# Patient Record
Sex: Male | Born: 1950
Health system: Southern US, Community
[De-identification: ages and names within clinical notes are randomized; demographics above are authoritative.]

## PROBLEM LIST (undated history)

## (undated) DIAGNOSIS — H269 Unspecified cataract: Secondary | ICD-10-CM

## (undated) DIAGNOSIS — I10 Essential (primary) hypertension: Secondary | ICD-10-CM

## (undated) DIAGNOSIS — B019 Varicella without complication: Secondary | ICD-10-CM

## (undated) DIAGNOSIS — T7840XA Allergy, unspecified, initial encounter: Secondary | ICD-10-CM

## (undated) DIAGNOSIS — N390 Urinary tract infection, site not specified: Secondary | ICD-10-CM

## (undated) DIAGNOSIS — D649 Anemia, unspecified: Secondary | ICD-10-CM

## (undated) DIAGNOSIS — E538 Deficiency of other specified B group vitamins: Secondary | ICD-10-CM

## (undated) DIAGNOSIS — E119 Type 2 diabetes mellitus without complications: Secondary | ICD-10-CM

## (undated) DIAGNOSIS — A499 Bacterial infection, unspecified: Secondary | ICD-10-CM

## (undated) DIAGNOSIS — K219 Gastro-esophageal reflux disease without esophagitis: Secondary | ICD-10-CM

## (undated) DIAGNOSIS — E7800 Pure hypercholesterolemia, unspecified: Secondary | ICD-10-CM

## (undated) DIAGNOSIS — H409 Unspecified glaucoma: Secondary | ICD-10-CM

## (undated) DIAGNOSIS — I2510 Atherosclerotic heart disease of native coronary artery without angina pectoris: Secondary | ICD-10-CM

## (undated) HISTORY — DX: Atherosclerotic heart disease of native coronary artery without angina pectoris: I25.10

## (undated) HISTORY — DX: Essential (primary) hypertension: I10

## (undated) HISTORY — DX: Allergy, unspecified, initial encounter: T78.40XA

## (undated) HISTORY — DX: Deficiency of other specified B group vitamins: E53.8

## (undated) HISTORY — DX: Urinary tract infection, site not specified: A49.9

## (undated) HISTORY — PX: CARDIAC CATHETERIZATION: SHX172

## (undated) HISTORY — PX: CATARACT EXTRACTION, BILATERAL: SHX1313

## (undated) HISTORY — DX: Varicella without complication: B01.9

## (undated) HISTORY — DX: Urinary tract infection, site not specified: N39.0

## (undated) HISTORY — DX: Unspecified cataract: H26.9

## (undated) HISTORY — DX: Anemia, unspecified: D64.9

## (undated) HISTORY — DX: Unspecified glaucoma: H40.9

---

## 1991-01-16 DIAGNOSIS — A159 Respiratory tuberculosis unspecified: Secondary | ICD-10-CM

## 1991-01-16 HISTORY — DX: Respiratory tuberculosis unspecified: A15.9

## 2013-01-15 DIAGNOSIS — I219 Acute myocardial infarction, unspecified: Secondary | ICD-10-CM

## 2013-01-15 HISTORY — DX: Acute myocardial infarction, unspecified: I21.9

## 2013-08-20 ENCOUNTER — Telehealth: Payer: Self-pay | Admitting: Internal Medicine

## 2013-08-20 NOTE — Telephone Encounter (Signed)
Closed encounter °

## 2013-08-23 ENCOUNTER — Inpatient Hospital Stay (HOSPITAL_COMMUNITY)
Admission: EM | Admit: 2013-08-23 | Discharge: 2013-08-31 | DRG: 234 | Disposition: A | Discharge status: Home / Self Care | Payer: 59 | Attending: Cardiothoracic Surgery | Admitting: Cardiothoracic Surgery

## 2013-08-23 DIAGNOSIS — I2510 Atherosclerotic heart disease of native coronary artery without angina pectoris: Secondary | ICD-10-CM | POA: Diagnosis present

## 2013-08-23 DIAGNOSIS — K219 Gastro-esophageal reflux disease without esophagitis: Secondary | ICD-10-CM | POA: Diagnosis present

## 2013-08-23 DIAGNOSIS — Z951 Presence of aortocoronary bypass graft: Secondary | ICD-10-CM

## 2013-08-23 DIAGNOSIS — D62 Acute posthemorrhagic anemia: Secondary | ICD-10-CM | POA: Diagnosis not present

## 2013-08-23 DIAGNOSIS — Z8249 Family history of ischemic heart disease and other diseases of the circulatory system: Secondary | ICD-10-CM

## 2013-08-23 DIAGNOSIS — I214 Non-ST elevation (NSTEMI) myocardial infarction: Principal | ICD-10-CM | POA: Diagnosis present

## 2013-08-23 DIAGNOSIS — I10 Essential (primary) hypertension: Secondary | ICD-10-CM | POA: Diagnosis present

## 2013-08-23 DIAGNOSIS — R079 Chest pain, unspecified: Secondary | ICD-10-CM | POA: Diagnosis not present

## 2013-08-23 DIAGNOSIS — E118 Type 2 diabetes mellitus with unspecified complications: Secondary | ICD-10-CM

## 2013-08-23 DIAGNOSIS — E1122 Type 2 diabetes mellitus with diabetic chronic kidney disease: Secondary | ICD-10-CM | POA: Diagnosis present

## 2013-08-23 DIAGNOSIS — E119 Type 2 diabetes mellitus without complications: Secondary | ICD-10-CM | POA: Diagnosis present

## 2013-08-23 DIAGNOSIS — E7800 Pure hypercholesterolemia, unspecified: Secondary | ICD-10-CM | POA: Diagnosis present

## 2013-08-23 DIAGNOSIS — E785 Hyperlipidemia, unspecified: Secondary | ICD-10-CM | POA: Diagnosis present

## 2013-08-23 DIAGNOSIS — D696 Thrombocytopenia, unspecified: Secondary | ICD-10-CM | POA: Diagnosis not present

## 2013-08-23 DIAGNOSIS — Z79899 Other long term (current) drug therapy: Secondary | ICD-10-CM

## 2013-08-23 DIAGNOSIS — Z833 Family history of diabetes mellitus: Secondary | ICD-10-CM

## 2013-08-23 DIAGNOSIS — I2582 Chronic total occlusion of coronary artery: Secondary | ICD-10-CM | POA: Diagnosis present

## 2013-08-23 DIAGNOSIS — E1169 Type 2 diabetes mellitus with other specified complication: Secondary | ICD-10-CM | POA: Diagnosis present

## 2013-08-23 DIAGNOSIS — I252 Old myocardial infarction: Secondary | ICD-10-CM

## 2013-08-23 DIAGNOSIS — Z7982 Long term (current) use of aspirin: Secondary | ICD-10-CM

## 2013-08-23 HISTORY — DX: Type 2 diabetes mellitus without complications: E11.9

## 2013-08-23 HISTORY — DX: Gastro-esophageal reflux disease without esophagitis: K21.9

## 2013-08-23 HISTORY — DX: Pure hypercholesterolemia, unspecified: E78.00

## 2013-08-24 ENCOUNTER — Other Ambulatory Visit: Payer: Self-pay

## 2013-08-24 ENCOUNTER — Encounter (HOSPITAL_COMMUNITY)
Admission: EM | Disposition: A | Discharge status: Home / Self Care | Payer: Self-pay | Source: Home / Self Care | Attending: Cardiothoracic Surgery

## 2013-08-24 ENCOUNTER — Encounter (HOSPITAL_COMMUNITY): Payer: Self-pay | Admitting: Emergency Medicine

## 2013-08-24 ENCOUNTER — Emergency Department (HOSPITAL_COMMUNITY): Payer: 59

## 2013-08-24 DIAGNOSIS — I2582 Chronic total occlusion of coronary artery: Secondary | ICD-10-CM | POA: Diagnosis present

## 2013-08-24 DIAGNOSIS — I10 Essential (primary) hypertension: Secondary | ICD-10-CM

## 2013-08-24 DIAGNOSIS — Z833 Family history of diabetes mellitus: Secondary | ICD-10-CM | POA: Diagnosis not present

## 2013-08-24 DIAGNOSIS — D62 Acute posthemorrhagic anemia: Secondary | ICD-10-CM | POA: Diagnosis not present

## 2013-08-24 DIAGNOSIS — D696 Thrombocytopenia, unspecified: Secondary | ICD-10-CM | POA: Diagnosis not present

## 2013-08-24 DIAGNOSIS — E7800 Pure hypercholesterolemia, unspecified: Secondary | ICD-10-CM | POA: Diagnosis present

## 2013-08-24 DIAGNOSIS — E785 Hyperlipidemia, unspecified: Secondary | ICD-10-CM

## 2013-08-24 DIAGNOSIS — I252 Old myocardial infarction: Secondary | ICD-10-CM | POA: Diagnosis not present

## 2013-08-24 DIAGNOSIS — I214 Non-ST elevation (NSTEMI) myocardial infarction: Secondary | ICD-10-CM

## 2013-08-24 DIAGNOSIS — K219 Gastro-esophageal reflux disease without esophagitis: Secondary | ICD-10-CM | POA: Diagnosis present

## 2013-08-24 DIAGNOSIS — E119 Type 2 diabetes mellitus without complications: Secondary | ICD-10-CM

## 2013-08-24 DIAGNOSIS — R079 Chest pain, unspecified: Secondary | ICD-10-CM | POA: Diagnosis present

## 2013-08-24 DIAGNOSIS — Z79899 Other long term (current) drug therapy: Secondary | ICD-10-CM | POA: Diagnosis not present

## 2013-08-24 DIAGNOSIS — I2510 Atherosclerotic heart disease of native coronary artery without angina pectoris: Secondary | ICD-10-CM

## 2013-08-24 DIAGNOSIS — Z7982 Long term (current) use of aspirin: Secondary | ICD-10-CM | POA: Diagnosis not present

## 2013-08-24 DIAGNOSIS — Z8249 Family history of ischemic heart disease and other diseases of the circulatory system: Secondary | ICD-10-CM | POA: Diagnosis not present

## 2013-08-24 LAB — BASIC METABOLIC PANEL
ANION GAP: 14 (ref 5–15)
Anion gap: 13 (ref 5–15)
BUN: 17 mg/dL (ref 6–23)
BUN: 18 mg/dL (ref 6–23)
CALCIUM: 9.8 mg/dL (ref 8.4–10.5)
CHLORIDE: 101 meq/L (ref 96–112)
CHLORIDE: 105 meq/L (ref 96–112)
CO2: 25 mEq/L (ref 19–32)
CO2: 28 meq/L (ref 19–32)
CREATININE: 1.12 mg/dL (ref 0.50–1.35)
Calcium: 8.7 mg/dL (ref 8.4–10.5)
Creatinine, Ser: 1.28 mg/dL (ref 0.50–1.35)
GFR calc Af Amer: 68 mL/min — ABNORMAL LOW (ref >90)
GFR calc non Af Amer: 59 mL/min — ABNORMAL LOW (ref >90)
GFR calc non Af Amer: 69 mL/min — ABNORMAL LOW (ref >90)
GFR, EST AFRICAN AMERICAN: 80 mL/min — AB (ref >90)
GLUCOSE: 155 mg/dL — AB (ref 70–99)
Glucose, Bld: 145 mg/dL — ABNORMAL HIGH (ref 70–99)
Potassium: 4.4 mEq/L (ref 3.7–5.3)
Potassium: 4.9 mEq/L (ref 3.7–5.3)
SODIUM: 143 meq/L (ref 137–147)
Sodium: 143 mEq/L (ref 137–147)

## 2013-08-24 LAB — TROPONIN I
Troponin I: 1.56 ng/mL (ref <0.30)
Troponin I: 1.81 ng/mL (ref <0.30)

## 2013-08-24 LAB — CBC WITH DIFFERENTIAL/PLATELET
Basophils Absolute: 0 10*3/uL (ref 0.0–0.1)
Basophils Relative: 1 % (ref 0–1)
Eosinophils Absolute: 0.4 10*3/uL (ref 0.0–0.7)
Eosinophils Relative: 5 % (ref 0–5)
HCT: 47.7 % (ref 39.0–52.0)
Hemoglobin: 15.6 g/dL (ref 13.0–17.0)
LYMPHS PCT: 35 % (ref 12–46)
Lymphs Abs: 3 10*3/uL (ref 0.7–4.0)
MCH: 27.8 pg (ref 26.0–34.0)
MCHC: 32.7 g/dL (ref 30.0–36.0)
MCV: 84.9 fL (ref 78.0–100.0)
Monocytes Absolute: 0.7 10*3/uL (ref 0.1–1.0)
Monocytes Relative: 8 % (ref 3–12)
NEUTROS ABS: 4.6 10*3/uL (ref 1.7–7.7)
Neutrophils Relative %: 51 % (ref 43–77)
PLATELETS: 197 10*3/uL (ref 150–400)
RBC: 5.62 MIL/uL (ref 4.22–5.81)
RDW: 13.4 % (ref 11.5–15.5)
WBC: 8.7 10*3/uL (ref 4.0–10.5)

## 2013-08-24 LAB — GLUCOSE, CAPILLARY
Glucose-Capillary: 143 mg/dL — ABNORMAL HIGH (ref 70–99)
Glucose-Capillary: 102 mg/dL — ABNORMAL HIGH (ref 70–99)
Glucose-Capillary: 113 mg/dL — ABNORMAL HIGH (ref 70–99)
Glucose-Capillary: 203 mg/dL — ABNORMAL HIGH (ref 70–99)

## 2013-08-24 LAB — APTT: APTT: 25 s (ref 24–37)

## 2013-08-24 LAB — CK TOTAL AND CKMB (NOT AT ARMC)
CK, MB: 6.3 ng/mL (ref 0.3–4.0)
Relative Index: 3.1 — ABNORMAL HIGH (ref 0.0–2.5)
Total CK: 205 U/L (ref 7–232)

## 2013-08-24 LAB — CBC
HEMATOCRIT: 44.3 % (ref 39.0–52.0)
Hemoglobin: 14.5 g/dL (ref 13.0–17.0)
MCH: 28 pg (ref 26.0–34.0)
MCHC: 32.7 g/dL (ref 30.0–36.0)
MCV: 85.5 fL (ref 78.0–100.0)
PLATELETS: 170 10*3/uL (ref 150–400)
RBC: 5.18 MIL/uL (ref 4.22–5.81)
RDW: 13.5 % (ref 11.5–15.5)
WBC: 9 10*3/uL (ref 4.0–10.5)

## 2013-08-24 LAB — PROTIME-INR
INR: 1.01 (ref 0.00–1.49)
INR: 1.08 (ref 0.00–1.49)
Prothrombin Time: 13.3 seconds (ref 11.6–15.2)
Prothrombin Time: 14 seconds (ref 11.6–15.2)

## 2013-08-24 LAB — HEMOGLOBIN A1C
Hgb A1c MFr Bld: 7.4 % — ABNORMAL HIGH (ref <5.7)
Mean Plasma Glucose: 166 mg/dL — ABNORMAL HIGH (ref <117)

## 2013-08-24 LAB — I-STAT TROPONIN, ED: TROPONIN I, POC: 0.76 ng/mL — AB (ref 0.00–0.08)

## 2013-08-24 LAB — LIPID PANEL
CHOL/HDL RATIO: 5.1 ratio
Cholesterol: 208 mg/dL — ABNORMAL HIGH (ref 0–200)
HDL: 41 mg/dL (ref >39)
LDL CALC: 152 mg/dL — AB (ref 0–99)
Triglycerides: 73 mg/dL (ref <150)
VLDL: 15 mg/dL (ref 0–40)

## 2013-08-24 LAB — HEPARIN LEVEL (UNFRACTIONATED): Heparin Unfractionated: 0.4 IU/mL (ref 0.30–0.70)

## 2013-08-24 SURGERY — LEFT HEART CATHETERIZATION WITH CORONARY ANGIOGRAM
Anesthesia: LOCAL

## 2013-08-24 MED ORDER — SODIUM CHLORIDE 0.9 % IV SOLN
1.0000 mL/kg/h | INTRAVENOUS | Status: DC
  Administered 2013-08-24: 1 mL/kg/h via INTRAVENOUS

## 2013-08-24 MED ORDER — SODIUM CHLORIDE 0.9 % IV SOLN: 1.0000 mL/kg/h | INTRAVENOUS | Status: DC

## 2013-08-24 MED ORDER — ASPIRIN 81 MG PO CHEW
324.0000 mg | CHEWABLE_TABLET | Freq: Once | ORAL | Status: AC
  Administered 2013-08-24: 243 mg via ORAL
  Filled 2013-08-24: qty 4

## 2013-08-24 MED ORDER — ASPIRIN EC 81 MG PO TBEC
81.0000 mg | DELAYED_RELEASE_TABLET | Freq: Every day | ORAL | Status: DC
  Administered 2013-08-25: 81 mg via ORAL
  Filled 2013-08-24 (×2): qty 1

## 2013-08-24 MED ORDER — FENTANYL CITRATE 0.05 MG/ML IJ SOLN
INTRAMUSCULAR | Status: AC
  Filled 2013-08-24: qty 2

## 2013-08-24 MED ORDER — VERAPAMIL HCL 2.5 MG/ML IV SOLN
INTRAVENOUS | Status: AC
  Filled 2013-08-24: qty 2

## 2013-08-24 MED ORDER — HEPARIN SODIUM (PORCINE) 1000 UNIT/ML IJ SOLN
INTRAMUSCULAR | Status: AC
  Filled 2013-08-24: qty 1

## 2013-08-24 MED ORDER — LIDOCAINE HCL (PF) 1 % IJ SOLN
INTRAMUSCULAR | Status: AC
  Filled 2013-08-24: qty 30

## 2013-08-24 MED ORDER — METOPROLOL TARTRATE 25 MG PO TABS
25.0000 mg | ORAL_TABLET | Freq: Two times a day (BID) | ORAL | Status: DC
  Administered 2013-08-24 – 2013-08-25 (×4): 25 mg via ORAL
  Filled 2013-08-24 (×6): qty 1

## 2013-08-24 MED ORDER — SODIUM CHLORIDE 0.9 % IJ SOLN: 3.0000 mL | INTRAMUSCULAR | Status: DC | PRN

## 2013-08-24 MED ORDER — MIDAZOLAM HCL 2 MG/2ML IJ SOLN
INTRAMUSCULAR | Status: AC
  Filled 2013-08-24: qty 2

## 2013-08-24 MED ORDER — ASPIRIN 81 MG PO CHEW
81.0000 mg | CHEWABLE_TABLET | ORAL | Status: DC
  Filled 2013-08-24: qty 1

## 2013-08-24 MED ORDER — HEPARIN (PORCINE) IN NACL 100-0.45 UNIT/ML-% IJ SOLN
900.0000 [IU]/h | INTRAMUSCULAR | Status: DC
  Administered 2013-08-24: 4000 [IU]/h via INTRAVENOUS
  Filled 2013-08-24 (×3): qty 250

## 2013-08-24 MED ORDER — PANTOPRAZOLE SODIUM 40 MG PO TBEC
40.0000 mg | DELAYED_RELEASE_TABLET | Freq: Every day | ORAL | Status: DC
Start: 2013-08-24 — End: 2013-08-26
  Administered 2013-08-24 – 2013-08-25 (×2): 40 mg via ORAL
  Filled 2013-08-24 (×2): qty 1

## 2013-08-24 MED ORDER — NITROGLYCERIN 1 MG/10 ML FOR IR/CATH LAB
INTRA_ARTERIAL | Status: AC
  Filled 2013-08-24: qty 10

## 2013-08-24 MED ORDER — ONDANSETRON HCL 4 MG/2ML IJ SOLN: 4.0000 mg | Freq: Four times a day (QID) | INTRAMUSCULAR | Status: DC | PRN

## 2013-08-24 MED ORDER — PNEUMOCOCCAL VAC POLYVALENT 25 MCG/0.5ML IJ INJ
0.5000 mL | INJECTION | INTRAMUSCULAR | Status: AC
  Administered 2013-08-25: 0.5 mL via INTRAMUSCULAR
  Filled 2013-08-24: qty 0.5

## 2013-08-24 MED ORDER — HEPARIN (PORCINE) IN NACL 2-0.9 UNIT/ML-% IJ SOLN
INTRAMUSCULAR | Status: AC
  Filled 2013-08-24: qty 1500

## 2013-08-24 MED ORDER — SODIUM CHLORIDE 0.9 % IV SOLN
250.0000 mL | INTRAVENOUS | Status: DC | PRN
  Administered 2013-08-25: 250 mL via INTRAVENOUS
  Administered 2013-08-26: 14:00:00 via INTRAVENOUS

## 2013-08-24 MED ORDER — SODIUM CHLORIDE 0.9 % IJ SOLN
3.0000 mL | Freq: Two times a day (BID) | INTRAMUSCULAR | Status: DC
  Administered 2013-08-24 – 2013-08-25 (×3): 3 mL via INTRAVENOUS

## 2013-08-24 MED ORDER — ACETAMINOPHEN 325 MG PO TABS: 650.0000 mg | ORAL_TABLET | ORAL | Status: DC | PRN

## 2013-08-24 MED ORDER — HEPARIN (PORCINE) IN NACL 100-0.45 UNIT/ML-% IJ SOLN
1200.0000 [IU]/h | INTRAMUSCULAR | Status: DC
  Administered 2013-08-25: 900 [IU]/h via INTRAVENOUS
  Filled 2013-08-24 (×4): qty 250

## 2013-08-24 MED ORDER — SODIUM CHLORIDE 0.9 % IV SOLN
INTRAVENOUS | Status: AC
  Administered 2013-08-24: 18:00:00 via INTRAVENOUS

## 2013-08-24 MED ORDER — NITROGLYCERIN 0.4 MG SL SUBL: 0.4000 mg | SUBLINGUAL_TABLET | SUBLINGUAL | Status: DC | PRN

## 2013-08-24 MED ORDER — SODIUM CHLORIDE 0.9 % IJ SOLN: 3.0000 mL | Freq: Two times a day (BID) | INTRAMUSCULAR | Status: DC

## 2013-08-24 MED ORDER — SODIUM CHLORIDE 0.9 % IV SOLN: 250.0000 mL | INTRAVENOUS | Status: DC | PRN

## 2013-08-24 MED ORDER — ATORVASTATIN CALCIUM 40 MG PO TABS
40.0000 mg | ORAL_TABLET | Freq: Every day | ORAL | Status: DC
  Administered 2013-08-24 – 2013-08-30 (×6): 40 mg via ORAL
  Filled 2013-08-24 (×8): qty 1

## 2013-08-24 MED ORDER — INSULIN ASPART 100 UNIT/ML ~~LOC~~ SOLN
0.0000 [IU] | Freq: Three times a day (TID) | SUBCUTANEOUS | Status: DC
  Administered 2013-08-24 – 2013-08-25 (×2): 2 [IU] via SUBCUTANEOUS
  Administered 2013-08-25: 3 [IU] via SUBCUTANEOUS
  Administered 2013-08-26: 2 [IU] via SUBCUTANEOUS

## 2013-08-24 MED ORDER — SODIUM CHLORIDE 0.9 % IJ SOLN
3.0000 mL | INTRAMUSCULAR | Status: DC | PRN
Start: 2013-08-24 — End: 2013-08-24

## 2013-08-24 MED ORDER — HEPARIN BOLUS VIA INFUSION
4000.0000 [IU] | Freq: Once | INTRAVENOUS | Status: AC
  Administered 2013-08-24: 900 [IU] via INTRAVENOUS
  Filled 2013-08-24: qty 4000

## 2013-08-24 NOTE — ED Notes (Signed)
Pt just started on several new medication this week,  States it had been a while since he had been to dr.  He was started on simvastatin,  Metoprolol, metformin and omeprazole,  States no history that he was treated for prior to this past  week

## 2013-08-24 NOTE — Progress Notes (Signed)
Pt arrived via [facility]. Pt alert and oriented and denies CP or SOB at this time. Dr. Elias Else paged and made aware that patient has arrived on floor. Pt connected to monitor and is NSR-72. Will continue to assess.

## 2013-08-24 NOTE — Interval H&P Note (Signed)
History and Physical Interval Note:  08/24/2013 4:03 PM  Angel Benson  has presented today for cardiac cath with the diagnosis of chest pain/NSTEMI. The various methods of treatment have been discussed with the patient and family. After consideration of risks, benefits and other options for treatment, the patient has consented to  Procedure(s): LEFT HEART CATHETERIZATION WITH CORONARY ANGIOGRAM (N/A) as a surgical intervention .  The patient's history has been reviewed, patient examined, no change in status, stable for surgery.  I have reviewed the patient's chart and labs.  Questions were answered to the patient's satisfaction.    Cath Lab Visit (complete for each Cath Lab visit)  Clinical Evaluation Leading to the Procedure:   ACS: No.  Non-ACS:    Anginal Classification: CCS IV  Anti-ischemic medical therapy: Minimal Therapy (1 class of medications)  Non-Invasive Test Results: No non-invasive testing performed  Prior CABG: No previous CABG        Cristiana Yochim

## 2013-08-24 NOTE — Progress Notes (Signed)
[place] for IV Heparin Indication: chest pain/ACS  No Known Allergies  Patient Measurements: Height: 5\' 9"  (175.3 cm) Weight: 186 lb 3.2 oz (84.46 kg) IBW/kg (Calculated) : 70.7 Heparin Dosing Weight: 75 kg  Vital Signs: Temp: 97.9 F (36.6 C) (08/10 1310) Temp src: Oral (08/10 1310) BP: 130/68 mmHg (08/10 1310) Pulse Rate: 71 (08/10 1633)  Labs:  Recent Labs  08/24/13 0025 08/24/13 0141 08/24/13 0442 08/24/13 0827 08/24/13 1335  HGB 15.6  --  14.5  --   --   HCT 47.7  --  44.3  --   --   PLT 197  --  170  --   --   APTT  --  25  --   --   --   LABPROT  --  13.3 14.0  --   --   INR  --  1.01 1.08  --   --   HEPARINUNFRC  --   --   --  0.40  --   CREATININE 1.28  --  1.12  --   --   CKTOTAL  --   --   --   --  205  CKMB  --   --   --   --  6.3*  TROPONINI  --   --  1.56* 1.81*  --     Estimated Creatinine Clearance: 69.3 ml/min (by C-G formula based on Cr of 1.12).   Medical History: Past Medical History  Diagnosis Date  . Diabetes mellitus without complication   . GERD (gastroesophageal reflux disease)   . Hypercholesteremia     Medications:  Scheduled:  . [START ON 08/25/2013] aspirin  81 mg Oral Pre-Cath  . [START ON 08/25/2013] aspirin EC  81 mg Oral Daily  . atorvastatin  40 mg Oral q1800  . insulin aspart  0-15 Units Subcutaneous TID WC  . metoprolol tartrate  25 mg Oral BID  . pantoprazole  40 mg Oral Daily  . [START ON 08/25/2013] pneumococcal 23 valent vaccine  0.5 mL Intramuscular Tomorrow-1000  . sodium chloride  3 mL Intravenous Q12H  . sodium chloride  3 mL Intravenous Q12H  . sodium chloride  3 mL Intravenous Q12H   Infusions:  . [START ON 08/25/2013] sodium chloride 1 mL/kg/hr (08/24/13 0405)  . [START ON 08/25/2013] sodium chloride    . sodium chloride    . heparin Stopped (08/24/13 1519)    Assessment: 63 yo M admitted 08/23/2013 with chest pain.  Pharmacy consulted to dose heparin and then to  resume 8h post sheath out at 1645. Troponin=1.81  Heparin level is therapeutic.  Goal of Therapy:  Heparin level 0.3-0.7 units/ml Monitor platelets by anticoagulation protocol: Yes   Plan:   Resume heparin drip @ 900 units/hr at 0045  Daily CBC/HL   Thank you for allowing pharmacy to be a part of this patients care team.  Rowe Robert Pharm.D., BCPS, AQ-Cardiology Clinical Pharmacist 08/24/2013 5:32 PM Pager: [phone] Phone: [phone]

## 2013-08-24 NOTE — Progress Notes (Signed)
[place] for IV Heparin Indication: chest pain/ACS  No Known Allergies  Patient Measurements: Height: 5\' 9"  (175.3 cm) Weight: 186 lb 3.2 oz (84.46 kg) IBW/kg (Calculated) : 70.7 Heparin Dosing Weight: 75 kg  Vital Signs: Temp: 98.3 F (36.8 C) (08/10 0430) Temp src: Oral (08/10 0430) BP: 118/72 mmHg (08/10 1043) Pulse Rate: 77 (08/10 0430)  Labs:  Recent Labs  08/24/13 0025 08/24/13 0141 08/24/13 0442 08/24/13 0827  HGB 15.6  --  14.5  --   HCT 47.7  --  44.3  --   PLT 197  --  170  --   APTT  --  25  --   --   LABPROT  --  13.3 14.0  --   INR  --  1.01 1.08  --   HEPARINUNFRC  --   --   --  0.40  CREATININE 1.28  --  1.12  --   TROPONINI  --   --  1.56* 1.81*    Estimated Creatinine Clearance: 69.3 ml/min (by C-G formula based on Cr of 1.12).   Medical History: Past Medical History  Diagnosis Date  . Diabetes mellitus without complication   . GERD (gastroesophageal reflux disease)   . Hypercholesteremia     Medications:  Scheduled:  . [START ON 08/25/2013] aspirin EC  81 mg Oral Daily  . atorvastatin  40 mg Oral q1800  . insulin aspart  0-15 Units Subcutaneous TID WC  . metoprolol tartrate  25 mg Oral BID  . pantoprazole  40 mg Oral Daily  . [START ON 08/25/2013] pneumococcal 23 valent vaccine  0.5 mL Intramuscular Tomorrow-1000  . sodium chloride  3 mL Intravenous Q12H  . sodium chloride  3 mL Intravenous Q12H   Infusions:  . [START ON 08/25/2013] sodium chloride 1 mL/kg/hr (08/24/13 0405)  . heparin 4,000 Units/hr (08/24/13 8099)    Assessment: 63 yo c/o CP. Troponin=1.81  Heparin level is therapeutic. Goal of Therapy:  Heparin level 0.3-0.7 units/ml Monitor platelets by anticoagulation protocol: Yes   Plan:   Cont heparin drip @ 900 units/hr  Daily CBC/HL   f/u after cath  Tynasia Mccaul, [place] 08/24/2013,12:17 PM

## 2013-08-24 NOTE — Progress Notes (Addendum)
Patient ID: Angel Benson, male   DOB: 1950/09/11, 63 y.o.   MRN: [id number]    Patient: Angel Benson / Admit Date: 08/23/2013 / Date of Encounter: 08/24/2013, 8:58 AM   Subjective: Doing well. No further chest pain since admission. Troponin 1.56. EKG with TWI anteriorly and lateral depression. Possible cardiac cath today. Currently NPO.    Objective: Telemetry: NSR, HR 70-80.  Physical Exam: Blood pressure 125/79, pulse 77, temperature 98.3 F (36.8 C), temperature source Oral, resp. rate 18, height 5\' 9"  (1.753 m), weight 186 lb 3.2 oz (84.46 kg), SpO2 100.00%. General: Well developed, well nourished, in no acute distress.Pleasant.  Head: Normocephalic, atraumatic, sclera non-icteric, no xanthomas, nares are without discharge. Neck: Negative for carotid bruits. JVP not elevated. Lungs: Clear bilaterally to auscultation without wheezes, rales, or rhonchi. Breathing is unlabored. Heart: RRR S1 S2 without murmurs, rubs, or gallops.  Abdomen: Soft, non-tender, non-distended with normoactive bowel sounds. No rebound/guarding. Extremities: No clubbing or cyanosis. No edema. Distal pedal pulses are 2+ and equal bilaterally. Neuro: Alert and oriented X 3. Moves all extremities spontaneously. Psych:  Responds to questions appropriately with a normal affect.   Intake/Output Summary (Last 24 hours) at 08/24/13 0858 Last data filed at 08/24/13 0600  Gross per 24 hour  Intake 174.96 ml  Output      0 ml  Net 174.96 ml    Inpatient Medications:  . [START ON 08/25/2013] aspirin EC  81 mg Oral Daily  . atorvastatin  40 mg Oral q1800  . insulin aspart  0-15 Units Subcutaneous TID WC  . metoprolol tartrate  25 mg Oral BID  . pantoprazole  40 mg Oral Daily  . [START ON 08/25/2013] pneumococcal 23 valent vaccine  0.5 mL Intramuscular Tomorrow-1000  . sodium chloride  3 mL Intravenous Q12H  . sodium chloride  3 mL Intravenous Q12H   Infusions:  . [START ON 08/25/2013] sodium chloride 1 mL/kg/hr  (08/24/13 0405)  . heparin 4,000 Units/hr (08/24/13 0212)    Labs:  Recent Labs  08/24/13 0025 08/24/13 0442  NA 143 143  K 4.4 4.9  CL 101 105  CO2 28 25  GLUCOSE 145* 155*  BUN 18 17  CREATININE 1.28 1.12  CALCIUM 9.8 8.7   No results found for this basename: AST, ALT, ALKPHOS, BILITOT, PROT, ALBUMIN,  in the last 72 hours  Recent Labs  08/24/13 0025 08/24/13 0442  WBC 8.7 9.0  NEUTROABS 4.6  --   HGB 15.6 14.5  HCT 47.7 44.3  MCV 84.9 85.5  PLT 197 170    Recent Labs  08/24/13 0442  TROPONINI 1.56*   No components found with this basename: POCBNP,  No results found for this basename: HGBA1C,  in the last 72 hours   Radiology/Studies:  Dg Chest 2 View  08/24/2013   CLINICAL DATA:  Chest pain and shortness of breath.  EXAM: CHEST  2 VIEW  COMPARISON:  None.  FINDINGS: The lungs are well-aerated. Mild peribronchial thickening is noted. There is no evidence of focal opacification, pleural effusion or pneumothorax.  The heart is normal in size; the mediastinal contour is within normal limits. No acute osseous abnormalities are seen.  IMPRESSION: Mild peribronchial thickening noted; lungs remain grossly clear.   Electronically Signed   By: Garald Balding M.D.   On: 08/24/2013 01:00     Assessment and Plan  Problem List:  1. NSTEMI 2. HTN 3. Strong FH CAD 4. DM2 5. Hyperlipidemia  62 y/o male with PMHx  of HTN, HL, DM2 who [facility] with chest and was found to have EKG changes (TWI anteriorly and lateral depression) and elevated troponin (1.56) consistent with NSTEMI.    1) NSTEMI -His symptoms are quite convincing of possible ischemia as they have always been exertional in nature except for last night, which brought him into the hospital. Possible cardiac cath today for early intervention. Currently chest pain free. Will continue current meds. On heparin, aspirin, statin, bb. Discussed risks and benefits of cath with patient.   2)  HTN -Well controlled -Continue current medications  3) DM2 -Metformin has been held in preparation for possible cath, on SSI -HA1C still pending -This is a recent dx for him, he is unable to tell me what his prior A1C has been  4) Hyperlipidemia -On lipitor 40 mg  -He does not he a healthy diet    Signed, Christell Faith, PA-C 08/24/2013 9:02 AM  Attending Note:  The patient was seen and examined. Agree with assessment and plan as noted above. Changes made to the above note as needed.  1. CAD:  Pt presents with CP and + Troponin levels. Better on IV heparin.  Will proceed with cath today.  I have discussed risks, benefits,options. He understands and agrees to proceed.  2. DM:  metformin is currently on hold.  3. Hyperlipidemia:  On atorvastatin 40 a day.    Thayer Headings, Brooke Bonito., MD, [facility]  08/24/2013, 11:15 AM  [address] [place]  Pager [phone]

## 2013-08-24 NOTE — H&P (View-Only) (Signed)
Patient ID: Angel Benson, male   DOB: [birth year], 63 y.o.   MRN: [id number]    Patient: Angel Benson / Admit Date: 08/23/2013 / Date of Encounter: 08/24/2013, 8:58 AM   Subjective: Doing well. No further chest pain since admission. Troponin 1.56. EKG with TWI anteriorly and lateral depression. Possible cardiac cath today. Currently NPO.    Objective: Telemetry: NSR, HR 70-80.  Physical Exam: Blood pressure 125/79, pulse 77, temperature 98.3 F (36.8 C), temperature source Oral, resp. rate 18, height 5\' 9"  (1.753 m), weight 186 lb 3.2 oz (84.46 kg), SpO2 100.00%. General: Well developed, well nourished, in no acute distress.Pleasant.  Head: Normocephalic, atraumatic, sclera non-icteric, no xanthomas, nares are without discharge. Neck: Negative for carotid bruits. JVP not elevated. Lungs: Clear bilaterally to auscultation without wheezes, rales, or rhonchi. Breathing is unlabored. Heart: RRR S1 S2 without murmurs, rubs, or gallops.  Abdomen: Soft, non-tender, non-distended with normoactive bowel sounds. No rebound/guarding. Extremities: No clubbing or cyanosis. No edema. Distal pedal pulses are 2+ and equal bilaterally. Neuro: Alert and oriented X 3. Moves all extremities spontaneously. Psych:  Responds to questions appropriately with a normal affect.   Intake/Output Summary (Last 24 hours) at 08/24/13 0858 Last data filed at 08/24/13 0600  Gross per 24 hour  Intake 174.96 ml  Output      0 ml  Net 174.96 ml    Inpatient Medications:  . [START ON 08/25/2013] aspirin EC  81 mg Oral Daily  . atorvastatin  40 mg Oral q1800  . insulin aspart  0-15 Units Subcutaneous TID WC  . metoprolol tartrate  25 mg Oral BID  . pantoprazole  40 mg Oral Daily  . [START ON 08/25/2013] pneumococcal 23 valent vaccine  0.5 mL Intramuscular Tomorrow-1000  . sodium chloride  3 mL Intravenous Q12H  . sodium chloride  3 mL Intravenous Q12H   Infusions:  . [START ON 08/25/2013] sodium chloride 1 mL/kg/hr  (08/24/13 0405)  . heparin 4,000 Units/hr (08/24/13 0212)    Labs:  Recent Labs  08/24/13 0025 08/24/13 0442  NA 143 143  K 4.4 4.9  CL 101 105  CO2 28 25  GLUCOSE 145* 155*  BUN 18 17  CREATININE 1.28 1.12  CALCIUM 9.8 8.7   No results found for this basename: AST, ALT, ALKPHOS, BILITOT, PROT, ALBUMIN,  in the last 72 hours  Recent Labs  08/24/13 0025 08/24/13 0442  WBC 8.7 9.0  NEUTROABS 4.6  --   HGB 15.6 14.5  HCT 47.7 44.3  MCV 84.9 85.5  PLT 197 170    Recent Labs  08/24/13 0442  TROPONINI 1.56*   No components found with this basename: POCBNP,  No results found for this basename: HGBA1C,  in the last 72 hours   Radiology/Studies:  Dg Chest 2 View  08/24/2013   CLINICAL DATA:  Chest pain and shortness of breath.  EXAM: CHEST  2 VIEW  COMPARISON:  None.  FINDINGS: The lungs are well-aerated. Mild peribronchial thickening is noted. There is no evidence of focal opacification, pleural effusion or pneumothorax.  The heart is normal in size; the mediastinal contour is within normal limits. No acute osseous abnormalities are seen.  IMPRESSION: Mild peribronchial thickening noted; lungs remain grossly clear.   Electronically Signed   By: Garald Balding M.D.   On: 08/24/2013 01:00     Assessment and Plan  Problem List:  1. NSTEMI 2. HTN 3. Strong FH CAD 4. DM2 5. Hyperlipidemia  63 y/o male with PMHx  of HTN, HL, DM2 who presented to [facility] on 08/24/2013 with chest and was found to have EKG changes (TWI anteriorly and lateral depression) and elevated troponin (1.56) consistent with NSTEMI.    1) NSTEMI -His symptoms are quite convincing of possible ischemia as they have always been exertional in nature except for last night, which brought him into the hospital. Possible cardiac cath today for early intervention. Currently chest pain free. Will continue current meds. On heparin, aspirin, statin, bb. Discussed risks and benefits of cath with patient.   2)  HTN -Well controlled -Continue current medications  3) DM2 -Metformin has been held in preparation for possible cath, on SSI -HA1C still pending -This is a recent dx for him, he is unable to tell me what his prior A1C has been  4) Hyperlipidemia -On lipitor 40 mg  -He does not he a healthy diet    Signed, Christell Faith, PA-C 08/24/2013 9:02 AM  Attending Note:  The patient was seen and examined. Agree with assessment and plan as noted above. Changes made to the above note as needed.  1. CAD:  Pt presents with CP and + Troponin levels. Better on IV heparin.  Will proceed with cath today.  I have discussed risks, benefits,options. He understands and agrees to proceed.  2. DM:  metformin is currently on hold.  3. Hyperlipidemia:  On atorvastatin 40 a day.    Thayer Headings, Brooke Bonito., MD, [facility]  08/24/2013, 11:15 AM  [address], [place]  Pager [phone]

## 2013-08-24 NOTE — Progress Notes (Signed)
Pt. Arrived back to unit from cath procedure with right TR band placed.  Site assessed, level 0, no bleeding.  Initially 10 cc of air in TR armband.  TR band deflated following protocol.  Frequent vitals set up.  No bleeding observed during deflation. Vital signs remained stable.  No complaints from pt.  TR band removed at 1820.  Will continue to monitor closely.  Kizzie Ide, RN

## 2013-08-24 NOTE — CV Procedure (Signed)
Cardiac Catheterization Operative Report  Angel Benson [id number] 8/10/20154:44 PM No PCP Per Patient  Procedure Performed:  1. Left Heart Catheterization 2. Selective Coronary Angiography 3. Left ventricular angiogram  Operator: Lauree Chandler, MD  Arterial access site:  Right radial artery.   Indication: 63 yo male with history of DM, HTN, HLD admitted with NSTEMI.                                     Procedure Details: The risks, benefits, complications, treatment options, and expected outcomes were discussed with the patient. The patient and/or family concurred with the proposed plan, giving informed consent. The patient was brought to the cath lab after IV hydration was begun and oral premedication was given. The patient was further sedated with Versed and Fentanyl. The right wrist was assessed with a reverse Allens test which was positive. The right wrist was prepped and draped in a sterile fashion. 1% lidocaine was used for local anesthesia. Using the modified Seldinger access technique, a 5 French sheath was placed in the right radial artery. 3 mg Verapamil was given through the sheath. 4300 units IV heparin was given. A JR 4 catheter was used to engage the RCA. Selective angiography was performed. A XB LAD 3.5 guiding catheter was used to engage the left main. Selective angiography was performed. A pigtail catheter was used to perform a left ventricular angiogram. The sheath was removed from the right radial artery and a Terumo hemostasis band was applied at the arteriotomy site on the right wrist.    There were no immediate complications. The patient was taken to the recovery area in stable condition.   Hemodynamic Findings: Central aortic pressure: 121/70 Left ventricular pressure: 117/2/4  Angiographic Findings:  Left main: Long vessel with diffuse 40-50% narrowing. There is dampening of the pressure tracing with catheter engagement.   Left Anterior  Descending Artery: Large caliber vessel that courses to the apex. The proximal vessel has a long segment with 99% sub-total occlusion. The mid vessel has diffuse plaque disease. The distal vessel is occluded and fills from right to left collaterals. There are two small to moderate caliber diagonal branches. These branches appear to have diffuse moderate stenosis.    Circumflex Artery: Large caliber vessel with three moderate to large caliber obtuse marginal branches. The proximal Circumflex has diffuse 30% stenosis. The mid vessel has diffuse 70% stenosis. The distal vessel has 99% stenosis. The first OM branch is a moderate to large caliber vessel with proximal 99% stenosis. The second OM branch is a moderate to large caliber vessel with 60% stenosis. The third OM branch is a moderate caliber vessel with proximal 99% stenosis. This vessel takes off after the severe mid AV groove vessel stenosis.   Right Coronary Artery: Moderate caliber co-dominant vessel with diffuse 90% stenosis throughout the entire mid vessel followed by 100% distal occlusion. The posterolateral branch and the PDA fill by right to right collaterals.   Left Ventricular Angiogram: LVEF=45-50% with inferior wall hypokinesis.   Impression: 1. Severe triple vessel CAD with total occlusion of the distal RCA, severe proximal LAD stenosis involving a long segment, occluded distal LAD, high grade disease in all three obtuse marginal branches and the mid Circumflex.   2. NSTEMI 3. Mild segmental LV systolic dysfunction  Recommendations: He has severe multi-vessel CAD, presenting as NSTEMI. Will consult CT surgery for CABG. (I have left  a message on the voice mail in the CT surgery office tonight). Will need to f/u in the am to make sure this is communicated. Will restart heparin IV 8 hours post sheath pull.        Complications:  None. The patient tolerated the procedure well.

## 2013-08-24 NOTE — Progress Notes (Signed)
Patient ID: Angel Benson, male   DOB: 11-19-1950, 63 y.o.   MRN: [id number]      Angel Benson       ,Angel Benson Date of Birth: [birth year]  Referring: Dr Debara Pickett Primary Care: No PCP Per Patient  Chief Complaint:    Chief Complaint  Patient presents with  . Chest Pain  . Shortness of Breath    HPI: 63 y.o. male  who presented to [place] and transferred to  [facility] on 08/24/2013 with complaints of chest pain. Recently seen by walk in clinic who started him on several new medications including a PPI. Over the past 2 months has had intermittent chest discomfort. Substernal, burning in nature, sometimes associated with food and sometimes assoc with exertion though symptoms are not consistent.  With associated symptoms of SOB, no  Radiation or  diaphoresis. He has strong family history of DM, brother with insulin pump and another brother s/p cab 4 years ago at young age.   Based upon symptoms was referred to cardiology, Patient has cardiology appointment for September 21 with Dr. Debara Pickett . On the evening of presentation, he had worse episode than previous in that occurred at rest and sought emergent care at [facility] long. Lasted for approx 45 minutes and resolved while at the ER.  In ER, was chest pain free. Given aspirin and started on heparin gtt after +troponin. Currently comfortable.  EKG revealed NSR with TWI anteriorly and lateral depressions. CXR was without acute cardiopulmonary abnormalities. Labs are significant for elevated troponin.      Current Activity/ Functional Status: Patient is independent with mobility/ambulation, transfers, ADL's, IADL's.   Zubrod Score: At the time of surgery this patient's most appropriate activity status/level should be described as: [x]     0    Normal activity, no symptoms []     1    Restricted in physical strenuous activity but  ambulatory, able to do out light work []     2    Ambulatory and capable of self care, unable to do work activities, up and about                 more than 50%  Of the time                            []     3    Only limited self care, in bed greater than 50% of waking hours []     4    Completely disabled, no self care, confined to bed or chair []     5    Moribund  Past Medical History  Diagnosis Date  . Diabetes mellitus without complication- new dx patient unaware    . GERD (gastroesophageal reflux disease)   . Hypercholesteremia     History reviewed. No pertinent past surgical history.  History  Smoking status  . Never Smoker   Smokeless tobacco  . Not on file    History  Alcohol Use No   Works [employer]ing - work involves lifting   No Known Allergies  Current Facility-Administered Medications  Medication Dose Route Frequency Provider Last Rate Last Dose  . 0.9 %  sodium chloride infusion  250 mL Intravenous PRN Cletus Gash, MD      .  0.9 %  sodium chloride infusion   Intravenous Continuous Burnell Blanks, MD 75 mL/hr at 08/24/13 1731    . acetaminophen (TYLENOL) tablet 650 mg  650 mg Oral Q4H PRN Cletus Gash, MD      . Derrill Memo ON 08/25/2013] aspirin EC tablet 81 mg  81 mg Oral Daily Cletus Gash, MD      . atorvastatin (LIPITOR) tablet 40 mg  40 mg Oral q1800 Cletus Gash, MD      . Derrill Memo ON 08/25/2013] heparin ADULT infusion 100 units/mL (25000 units/250 mL)  900 Units/hr Intravenous Continuous Jolaine Artist, MD      . insulin aspart (novoLOG) injection 0-15 Units  0-15 Units Subcutaneous TID WC Cletus Gash, MD   2 Units at 08/24/13 [phone]  . metoprolol tartrate (LOPRESSOR) tablet 25 mg  25 mg Oral BID Cletus Gash, MD   25 mg at 08/24/13 1043  . nitroGLYCERIN (NITROSTAT) SL tablet 0.4 mg  0.4 mg Sublingual Q5 Min x 3 PRN Cletus Gash, MD      . ondansetron [facility]) injection 4 mg  4 mg Intravenous Q6H PRN Cletus Gash,  MD      . pantoprazole (PROTONIX) EC tablet 40 mg  40 mg Oral Daily Cletus Gash, MD   40 mg at 08/24/13 1043  . [START ON 08/25/2013] pneumococcal 23 valent vaccine (PNU-IMMUNE) injection 0.5 mL  0.5 mL Intramuscular Tomorrow-1000 Shaune Pascal Bensimhon, MD      . sodium chloride 0.9 % injection 3 mL  3 mL Intravenous Q12H Cletus Gash, MD   3 mL at 08/24/13 0345  . sodium chloride 0.9 % injection 3 mL  3 mL Intravenous PRN Cletus Gash, MD        Prescriptions prior to admission  Medication Sig Dispense Refill  . metFORMIN (GLUCOPHAGE) 500 MG tablet Take 500 mg by mouth 2 (two) times daily with a meal.      . metoprolol succinate (TOPROL-XL) 50 MG 24 hr tablet Take 50 mg by mouth daily. Take with or immediately following a meal.      . omeprazole (PRILOSEC) 20 MG capsule Take 20 mg by mouth daily.      . simvastatin (ZOCOR) 20 MG tablet Take 20 mg by mouth daily.        Family History: Both mother anfd father with DM, brother had cabg by me 4 years ago at [facility], another brother  hx of dm x 30 yesr uses insulin pump   Review of Systems:     Cardiac Review of Systems: Y or N  Chest Pain [  y  ]  Resting SOB [ y  ] Exertional SOB  [  y]  Orthopnea [  n]   Pedal Edema [ n  ]    Palpitations [ n ] Syncope  [n ]   Presyncope [  n ]  General Review of Systems: [Y] = yes [  ]=no Constitional: recent weight change [n  ]; anorexia [  ]; fatigue [ y ]; nausea [n  ]; night sweats [ n ]; fever [  ]; or chills [n  ]                                                               Dental: poor dentition[  ];  Last Dentist visit:   Eye : blurred vision [  ]; diplopia [   ]; vision changes [  ];  Amaurosis fugax[  ]; Resp: cough [  ];  wheezing[  ];  hemoptysis[n  ]; shortness of breath[ y ]; paroxysmal nocturnal dyspnea[n  ]; dyspnea on exertion[  ]; or orthopnea[n  ];  GI:  gallstones[ n ], vomiting[n ];  dysphagia[  ]; melena[  ];  hematochezia [  ]; heartburn[  ];   Hx of  Colonoscopy[n  ]; GU:  kidney stones [  ]; hematuria[  ];   dysuria [  ];  nocturia[  ];  history of     obstruction [  ]; urinary frequency [n  ]             Skin: rash, swelling[  ];, hair loss[  ];  peripheral edema[  ];  or itching[  ]; Musculosketetal: myalgias[  ];  joint swelling[  ];  joint erythema[  ];  joint pain[  ];  back pain[  ];  Heme/Lymph: bruising[  ];  bleeding[  ];  anemia[  ];  Neuro: TIA[ n ];  headaches[  ];  stroke[ n];  vertigo[  ];  seizures[  ];   paresthesias[  ];  difficulty walking[  ];  Psych:depression[  ]; anxiety[  ];  Endocrine: diabetes[  New dx];  thyroid dysfunction[  ];  Immunizations: Flu [ ? ]; Pneumococcal[ ? ];  Other:  Physical Exam: BP 125/70  Pulse 74  Temp(Src) 97.9 F (36.6 C) (Oral)  Resp 18  Ht 5\' 9"  (1.753 m)  Wt 186 lb 3.2 oz (84.46 kg)  BMI 27.48 kg/m2  SpO2 100%  General appearance: alert, cooperative, appears stated age and no distress Neurologic: intact Heart: regular rate and rhythm, S1, S2 normal, no murmur, click, rub or gallop Lungs: clear to auscultation bilaterally Abdomen: soft, non-tender; bowel sounds normal; no masses,  no organomegaly Extremities: extremities normal, atraumatic, no cyanosis or edema and Homans sign is negative, no sign of DVT Wound: rt radial compression in place  no carotid bruits, 2 + dp and PT pulses bilaterial   Diagnostic Studies & Laboratory data:     Recent Radiology Findings:   Dg Chest 2 View  08/24/2013   CLINICAL DATA:  Chest pain and shortness of breath.  EXAM: CHEST  2 VIEW  COMPARISON:  None.  FINDINGS: The lungs are well-aerated. Mild peribronchial thickening is noted. There is no evidence of focal opacification, pleural effusion or pneumothorax.  The heart is normal in size; the mediastinal contour is within normal limits. No acute osseous abnormalities are seen.  IMPRESSION: Mild peribronchial thickening noted; lungs remain grossly clear.   Electronically Signed   By: Garald Balding M.D.   On:  08/24/2013 01:00      Recent Lab Findings: Lab Results  Component Value Date   WBC 9.0 08/24/2013   HGB 14.5 08/24/2013   HCT 44.3 08/24/2013   PLT 170 08/24/2013   GLUCOSE 155* 08/24/2013   CHOL 208* 08/24/2013   TRIG 73 08/24/2013   HDL 41 08/24/2013   LDLCALC 152* 08/24/2013   NA 143 08/24/2013   K 4.9 08/24/2013   CL 105 08/24/2013   CREATININE 1.12 08/24/2013   BUN 17 08/24/2013   CO2 25 08/24/2013   INR 1.08 08/24/2013   HGBA1C 7.4* 08/24/2013   Lab Results  Component Value Date   CKTOTAL 205 08/24/2013   CKMB 6.3* 08/24/2013  TROPONINI 1.81* 08/24/2013   CATH: Hemodynamic Findings:  Central aortic pressure: 121/70  Left ventricular pressure: 117/2/4  Angiographic Findings:  Left main: Long vessel with diffuse 40-50% narrowing. There is dampening of the pressure tracing with catheter engagement.  Left Anterior Descending Artery: Large caliber vessel that courses to the apex. The proximal vessel has a long segment with 99% sub-total occlusion. The mid vessel has diffuse plaque disease. The distal vessel is occluded and fills from right to left collaterals. There are two small to moderate caliber diagonal branches. These branches appear to have diffuse moderate stenosis.  Circumflex Artery: Large caliber vessel with three moderate to large caliber obtuse marginal branches. The proximal Circumflex has diffuse 30% stenosis. The mid vessel has diffuse 70% stenosis. The distal vessel has 99% stenosis. The first OM branch is a moderate to large caliber vessel with proximal 99% stenosis. The second OM branch is a moderate to large caliber vessel with 60% stenosis. The third OM branch is a moderate caliber vessel with proximal 99% stenosis. This vessel takes off after the severe mid AV groove vessel stenosis.  Right Coronary Artery: Moderate caliber co-dominant vessel with diffuse 90% stenosis throughout the entire mid vessel followed by 100% distal occlusion. The posterolateral branch and the PDA  fill by right to right collaterals.  Left Ventricular Angiogram: LVEF=45-50% with inferior wall hypokinesis.  Impression:  1. Severe triple vessel CAD with total occlusion of the distal RCA, severe proximal LAD stenosis involving a long segment, occluded distal LAD, high grade disease in all three obtuse marginal branches and the mid Circumflex.  2. NSTEMI  3. Mild segmental LV systolic dysfunction  Recommendations: He has severe multi-vessel CAD, presenting as NSTEMI. Will consult CT surgery for CABG. (I have left a message on the voice mail in the CT surgery office tonight). Will need to f/u in the am to make sure this is communicated. Will restart heparin IV 8 hours post sheath pull.  Complications: None. The patient tolerated the procedure well.      Assessment / Plan:   Three vessel cad with non  stemi  New dx of DM hypertension  I have seen patient and reviewed films with him. Agree with CABG as best treatment option.  Plan cabg Wednesday am The goals risks and alternatives of the planned surgical procedure CABG have been discussed with the patient in detail. The risks of the procedure including death, infection, stroke, myocardial infarction, bleeding, blood transfusion have all been discussed specifically.  I have quoted Teresa Pelton a 3 % of perioperative mortality and a complication rate as high as 20%. The patient's questions have been answered.Brandyn Lowrey is willing  to proceed with the planned procedure.    Grace Isaac MD      Angel Benson,Angel Benson   Beeper [phone]  08/24/2013 5:49 PM

## 2013-08-24 NOTE — ED Notes (Signed)
Patient transported to X-ray 

## 2013-08-24 NOTE — Research (Signed)
BIOFLOW Informed Consent   Subject Name: Angel Benson  Subject met inclusion and exclusion criteria.  The informed consent form, study requirements and expectations were reviewed with the subject and questions and concerns were addressed prior to the signing of the consent form.  The subject verbalized understanding of the trail requirements.  The subject agreed to participate in the Bioflow trial and signed the informed consent.  The informed consent was obtained prior to performance of any protocol-specific procedures for the subject.  A copy of the signed informed consent was given to the subject and a copy was placed in the subject's medical record.  Sandie Ano 08/24/2013, 12:20

## 2013-08-24 NOTE — H&P (Addendum)
History and Physical  Patient ID: Angel Benson MRN: [id number], SOB: July 29, 1950 63 y.o. Date of Encounter: 08/24/2013, 2:05 AM  Primary Physician: No PCP Per Patient Primary Cardiologist: Was to see Dr. Debara Pickett as outpatient  Chief Complaint: chest pain  HPI: 63 y.o. male w/ PMHx significant for recent diagnosis of HTN, hyperlipidemia and diabetes who presented to [facility] on 08/24/2013 with complaints of chest pain. Recently seen by his PCP who started him on several new medications including a PPI.  Over the past 2 weeks has had intermittent chest discomfort. Substernal, burning in nature, sometimes associated with food and sometimes assoc with exertion though symptoms are not consistent. No associated symptoms of SOB, radiation, diaphoresis.    Based upon symptoms was referred to cardiology, Dr. Debara Pickett but appt is not for several weeks. On the evening of presentation, he had worse episode than previous in that occurred at rest and sought emergent care at [facility] long. Lasted for approx 45 minutes and resolved while at the ER.  In ER, was chest pain free. Given aspirin and started on heparin gtt after +troponin. Currently comfortable.  EKG revealed NSR with TWI anteriorly and lateral depressions. CXR was without acute cardiopulmonary abnormalities. Labs are significant for elevated troponin.   Past Medical History  Diagnosis Date  . Diabetes mellitus without complication   . GERD (gastroesophageal reflux disease)   . Hypercholesteremia      Surgical History: History reviewed. No pertinent past surgical history.   Home Meds: Prior to Admission medications   Medication Sig Start Date End Date Taking? Authorizing Provider  metFORMIN (GLUCOPHAGE) 500 MG tablet Take 500 mg by mouth 2 (two) times daily with a meal.   Yes Historical Provider, MD  metoprolol succinate (TOPROL-XL) 50 MG 24 hr tablet Take 50 mg by mouth daily. Take with or immediately following a meal.   Yes Historical  Provider, MD  omeprazole (PRILOSEC) 20 MG capsule Take 20 mg by mouth daily.   Yes Historical Provider, MD  simvastatin (ZOCOR) 20 MG tablet Take 20 mg by mouth daily.   Yes Historical Provider, MD    Allergies: No Known Allergies  History   Social History  . Marital Status: Divorced    Spouse Name: N/A    Number of Children: N/A  . Years of Education: N/A   Occupational History  . Not on file.   Social History Main Topics  . Smoking status: Never Smoker   . Smokeless tobacco: Not on file  . Alcohol Use: No  . Drug Use: No  . Sexual Activity: Not on file   Other Topics Concern  . Not on file   Social History Narrative  . No narrative on file     No family history on file. +FH in brother with CABG at 6 ys  Review of Systems: General: negative for chills, fever, night sweats or weight changes.  Cardiovascular: see HPI Dermatological: negative for rash  Respiratory: negative for cough or wheezing Urologic: negative for hematuria Abdominal: negative for nausea, vomiting, diarrhea, bright red blood per rectum, melena, or hematemesis Neurologic: negative for visual changes, syncope, or dizziness All other systems reviewed and are otherwise negative except as noted above.  Labs:   Lab Results  Component Value Date   WBC 8.7 08/24/2013   HGB 15.6 08/24/2013   HCT 47.7 08/24/2013   MCV 84.9 08/24/2013   PLT 197 08/24/2013    Recent Labs Lab 08/24/13 0025  NA 143  K 4.4  CL  101  CO2 28  BUN 18  CREATININE 1.28  CALCIUM 9.8  GLUCOSE 145*   No results found for this basename: CKTOTAL, CKMB, TROPONINI,  in the last 72 hours No results found for this basename: CHOL, HDL, LDLCALC, TRIG   No results found for this basename: DDIMER    Radiology/Studies:  Dg Chest 2 View  08/24/2013   CLINICAL DATA:  Chest pain and shortness of breath.  EXAM: CHEST  2 VIEW  COMPARISON:  None.  FINDINGS: The lungs are well-aerated. Mild peribronchial thickening is noted. There is  no evidence of focal opacification, pleural effusion or pneumothorax.  The heart is normal in size; the mediastinal contour is within normal limits. No acute osseous abnormalities are seen.  IMPRESSION: Mild peribronchial thickening noted; lungs remain grossly clear.   Electronically Signed   By: Garald Balding M.D.   On: 08/24/2013 01:00     EKG: sinus,anterior biphasic T wave changes, minimal lateral depressions in v4, v5. No comparison  Physical Exam: Blood pressure 128/73, pulse 70, temperature 97.7 F (36.5 C), temperature source Oral, resp. rate 23, height 5\' 9"  (1.753 m), weight 84.913 kg (187 lb 3.2 oz), SpO2 100.00%. General: Well developed, well nourished, in no acute distress. Head: Normocephalic, atraumatic, sclera non-icteric, nares are without discharge Neck: Supple. Negative for carotid bruits. JVD not elevated. Lungs: Clear bilaterally to auscultation without wheezes, rales, or rhonchi. Breathing is unlabored. Heart: RRR with S1 S2. No murmurs, rubs, or gallops appreciated. Abdomen: Soft, non-tender, non-distended with normoactive bowel sounds. No rebound/guarding. No obvious abdominal masses. Msk:  Strength and tone appear normal for age. Extremities: No edema. No clubbing or cyanosis. Distal pedal pulses are 2+ and equal bilaterally. Neuro: Alert and oriented X 3. Moves all extremities spontaneously. Psych:  Responds to questions appropriately with a normal affect.    ASSESSMENT AND PLAN:  Problem List 1. NSTEMI 2. HTN 3. +strong FH of CAD 4. DM2 5. Hyperlipidemia  63 y.o. male w/ PMHx significant for recent diagnosis of HTN, hyperlipidemia and diabetes who presented to [facility] on 08/24/2013 with complaints of chest pain --> concerning EKG changes and +troponin is consistent with NSTEMI.  Currently chest pain free. Plan to continue aspirin, statin, beta blocker and keep NPO in preparation for early invasive strategy with catheterization. Discussed risks and  benefits of cath with patient.  Hold metformin due to contrast and hospitalization and cover with sliding scale insulin for now. A1C pending.  Continue home PPI though unlikely sxs are GERD and more likely CAD.  Prophy: Heparin gtt PPI Full code   Signed, WHITLOCK, MATTHEW C. MD 08/24/2013, 2:05 AM  Attending Note:   The patient was seen and examined.  Agree with assessment and plan as noted above.  Changes made to the above note as needed.  1. CAD:  Pt presents with CP and + Troponin levels.  Better on IV heparin. Will proceed with cath today. I have discussed risks, benefits,options.  He understands and agrees to proceed.   2. DM:   metformin is currently on hold.   3. Hyperlipidemia:   On atorvastatin 40 a day.    Thayer Headings, Brooke Bonito., MD, [facility] 08/24/2013, 11:15 AM [address],  [place] Pager [phone]

## 2013-08-24 NOTE — ED Notes (Signed)
Pt reports mid-sternal cp which severity became worse x 2 weeks ago.  Pt reports prior to 2 weeks ago, cp is intermittent and it was tolerable.  Pt reports pain lasts longer now and is more severe.  Pt denies radiation.  Pt reports SOB with the cp.  Denies any cp at this time.

## 2013-08-24 NOTE — Progress Notes (Signed)
ANTICOAGULATION CONSULT NOTE - Initial Consult  Pharmacy Consult for IV Heparin Indication: chest pain/ACS  No Known Allergies  Patient Measurements: Height: 5\' 9"  (175.3 cm) Weight: 187 lb 3.2 oz (84.913 kg) IBW/kg (Calculated) : 70.7 Heparin Dosing Weight: 75 kg  Vital Signs: Temp: 97.7 F (36.5 C) (08/09 2349) Temp src: Oral (08/09 2349) BP: 120/70 mmHg (08/10 0200) Pulse Rate: 72 (08/10 0200)  Labs:  Recent Labs  08/24/13 0025 08/24/13 0141  HGB 15.6  --   HCT 47.7  --   PLT 197  --   APTT  --  25  LABPROT  --  13.3  INR  --  1.01  CREATININE 1.28  --     Estimated Creatinine Clearance: 65.5 ml/min (by C-G formula based on Cr of 1.28).   Medical History: Past Medical History  Diagnosis Date  . Diabetes mellitus without complication   . GERD (gastroesophageal reflux disease)   . Hypercholesteremia     Medications:  Scheduled:   Infusions:  . heparin 4,000 Units/hr (08/24/13 9417)    Assessment: 63 yo c/o CP. Troponin=0.76. IV Heparin per Rx for ACS. Goal of Therapy:  Heparin level 0.3-0.7 units/ml Monitor platelets by anticoagulation protocol: Yes   Plan:   Baseline coags/ht stat  Heparin 4000 unit bolus x1  Start drip @ 900 units/hr  Daily CBC/HL  Check 1st HL 0830  Lawana Pai R 08/24/2013,2:29 AM

## 2013-08-24 NOTE — Progress Notes (Signed)
CRITICAL VALUE ALERT  Critical value received:  CK,mb  6.3 (H)  Date of notification: 8/102015  Time of notification:  1440  Critical value read back:Yes.    Nurse who received alert:  Elmarie Shiley  MD notified (1st page): Christell Faith, PA  Time of first page:  1445  Responding MD:  Christell Faith, MD  Time MD responded:  [phone]

## 2013-08-24 NOTE — ED Provider Notes (Signed)
CSN: [id number]     Arrival date & time 08/23/13  2345 History   First MD Initiated Contact with Patient 08/24/13 0007     Chief Complaint  Patient presents with  . Chest Pain  . Shortness of Breath     (Consider location/radiation/quality/duration/timing/severity/associated sxs/prior Treatment) HPI  This is a 63 year old male who presents with chest pain. Patient reports 2 week history of waxing and waning chest pain. He states that it is substernal and nonradiating. He describes it as burning. It is sometimes associated with food and sometimes associated with exertion. He does report associated shortness of breath. He is currently pain-free but had an episode of pain approximately 30 minutes prior to arrival. At that time he was sitting down. He reports that he had a normal dinner. Was recently evaluated by her primary care physician and started on new medications including metformin and simvastatin. No known history of hypertension. Does report an early family history of heart disease in a brother.  No past medical history on file. No past surgical history on file. No family history on file. History  Substance Use Topics  . Smoking status: Not on file  . Smokeless tobacco: Not on file  . Alcohol Use: Not on file    Review of Systems  Constitutional: Negative.  Negative for fever.  Respiratory: Positive for chest tightness and shortness of breath.   Cardiovascular: Negative.  Negative for chest pain and leg swelling.  Gastrointestinal: Negative.  Negative for nausea, vomiting and abdominal pain.  Genitourinary: Negative.  Negative for dysuria.  Musculoskeletal: Negative for back pain.  Neurological: Negative for headaches.  All other systems reviewed and are negative.     Allergies  Review of patient's allergies indicates no known allergies.  Home Medications   Prior to Admission medications   Medication Sig Start Date End Date Taking? Authorizing Provider  metFORMIN  (GLUCOPHAGE) 500 MG tablet Take 500 mg by mouth 2 (two) times daily with a meal.   Yes Historical Provider, MD  metoprolol succinate (TOPROL-XL) 50 MG 24 hr tablet Take 50 mg by mouth daily. Take with or immediately following a meal.   Yes Historical Provider, MD  omeprazole (PRILOSEC) 20 MG capsule Take 20 mg by mouth daily.   Yes Historical Provider, MD  simvastatin (ZOCOR) 20 MG tablet Take 20 mg by mouth daily.   Yes Historical Provider, MD   BP 140/81  Pulse 69  Temp(Src) 97.7 F (36.5 C) (Oral)  Resp 20  Ht 5\' 9"  (1.753 m)  Wt 187 lb 3.2 oz (84.913 kg)  BMI 27.63 kg/m2  SpO2 100% Physical Exam  Nursing note and vitals reviewed. Constitutional: He is oriented to person, place, and time. He appears well-developed and well-nourished. No distress.  HENT:  Head: Normocephalic and atraumatic.  Cardiovascular: Normal rate, regular rhythm and normal heart sounds.   No murmur heard. Pulmonary/Chest: Effort normal and breath sounds normal. No respiratory distress. He has no wheezes.  Abdominal: Soft. Bowel sounds are normal. There is no tenderness. There is no rebound.  Musculoskeletal: He exhibits no edema.  Neurological: He is alert and oriented to person, place, and time.  Skin: Skin is warm and dry.  Psychiatric: He has a normal mood and affect.    ED Course  Procedures (including critical care time)  CRITICAL CARE Performed by: Thayer Jew, F   Total critical care time: 30 min  Critical care time was exclusive of separately billable procedures and treating other patients.  Critical care  was necessary to treat or prevent imminent or life-threatening deterioration.  Critical care was time spent personally by me on the following activities: development of treatment plan with patient and/or surrogate as well as nursing, discussions with consultants, evaluation of patient's response to treatment, examination of patient, obtaining history from patient or surrogate, ordering  and performing treatments and interventions, ordering and review of laboratory studies, ordering and review of radiographic studies, pulse oximetry and re-evaluation of patient's condition.  Labs Review Labs Reviewed  BASIC METABOLIC PANEL - Abnormal; Notable for the following:    Glucose, Bld 145 (*)    GFR calc non Af Amer 59 (*)    GFR calc Af Amer 68 (*)    All other components within normal limits  I-STAT TROPOININ, ED - Abnormal; Notable for the following:    Troponin i, poc 0.76 (*)    All other components within normal limits  CBC WITH DIFFERENTIAL  APTT  PROTIME-INR    Imaging Review Dg Chest 2 View  08/24/2013   CLINICAL DATA:  Chest pain and shortness of breath.  EXAM: CHEST  2 VIEW  COMPARISON:  None.  FINDINGS: The lungs are well-aerated. Mild peribronchial thickening is noted. There is no evidence of focal opacification, pleural effusion or pneumothorax.  The heart is normal in size; the mediastinal contour is within normal limits. No acute osseous abnormalities are seen.  IMPRESSION: Mild peribronchial thickening noted; lungs remain grossly clear.   Electronically Signed   By: Garald Balding M.D.   On: 08/24/2013 01:00     EKG Interpretation   Date/Time:  Sunday August 23 2013 23:58:18 EDT Ventricular Rate:  72 PR Interval:  119 QRS Duration: 100 QT Interval:  437 QTC Calculation: 478 R Axis:   89 Text Interpretation:  Sinus rhythm Borderline short PR interval Borderline  right axis deviation Repol abnrm, prob ischemia, anterolateral lds No  prior for comparison anterior and lateral T wave inversions Confirmed by  HORTON  MD, COURTNEY ([id number]) on 08/24/2013 12:07:25 AM      MDM   Final diagnoses:  NSTEMI (non-ST elevated myocardial infarction)   Patient presents for chest pain. He does have risk factors for ACS including hyperlipidemia and diabetes as well as early family history. EKG has T wave inversions with depressions laterally. No ST elevation noted in  patient is pain-free.  Some features of his story suggests GI origin; however, given risk factors and EKG, would be concerned for ACS as well. Patient was given a full dose aspirin. Basic labwork was obtained including troponin. Troponin elevated to 0.76. Patient continues to be pain-free while in the emergency room. Patient was placed on a heparin drip and cardiology was consulted. He will be transferred to [facility] for further evaluation.    Merryl Hacker, MD 08/24/13 [phone]

## 2013-08-24 NOTE — Progress Notes (Signed)
Patient ID: Angel Benson, male   DOB: 1950-07-06, 63 y.o.   MRN: [id number]       Cath scheduled for tomorrow at noon. Troponin 1.56->1.81. CKMB 6.3.  Christell Faith, MHS, PA-C [place], [place] [address] [phone] [facility] 08/24/2013 2:56 PM    Attending :  We were able to work the patient in for cath today.   Thayer Headings, Brooke Bonito., MD, [facility] 08/24/2013, 4:26 PM [address]  [place] Pager [phone]

## 2013-08-25 ENCOUNTER — Inpatient Hospital Stay (HOSPITAL_COMMUNITY): Payer: 59

## 2013-08-25 ENCOUNTER — Encounter (HOSPITAL_COMMUNITY)
Admission: EM | Disposition: A | Discharge status: Home / Self Care | Payer: Self-pay | Source: Home / Self Care | Attending: Cardiothoracic Surgery

## 2013-08-25 DIAGNOSIS — E119 Type 2 diabetes mellitus without complications: Secondary | ICD-10-CM | POA: Diagnosis present

## 2013-08-25 DIAGNOSIS — Z01810 Encounter for preprocedural cardiovascular examination: Secondary | ICD-10-CM

## 2013-08-25 DIAGNOSIS — E1169 Type 2 diabetes mellitus with other specified complication: Secondary | ICD-10-CM | POA: Diagnosis present

## 2013-08-25 DIAGNOSIS — E118 Type 2 diabetes mellitus with unspecified complications: Secondary | ICD-10-CM | POA: Diagnosis present

## 2013-08-25 DIAGNOSIS — I2510 Atherosclerotic heart disease of native coronary artery without angina pectoris: Secondary | ICD-10-CM | POA: Diagnosis present

## 2013-08-25 DIAGNOSIS — E1122 Type 2 diabetes mellitus with diabetic chronic kidney disease: Secondary | ICD-10-CM | POA: Diagnosis present

## 2013-08-25 DIAGNOSIS — I10 Essential (primary) hypertension: Secondary | ICD-10-CM | POA: Diagnosis present

## 2013-08-25 DIAGNOSIS — E785 Hyperlipidemia, unspecified: Secondary | ICD-10-CM | POA: Diagnosis present

## 2013-08-25 LAB — URINALYSIS, ROUTINE W REFLEX MICROSCOPIC
Bilirubin Urine: NEGATIVE
Glucose, UA: 500 mg/dL — AB
Hgb urine dipstick: NEGATIVE
Ketones, ur: NEGATIVE mg/dL
Leukocytes, UA: NEGATIVE
Nitrite: NEGATIVE
Protein, ur: NEGATIVE mg/dL
Specific Gravity, Urine: 1.019 (ref 1.005–1.030)
Urobilinogen, UA: 0.2 mg/dL (ref 0.0–1.0)
pH: 5 (ref 5.0–8.0)

## 2013-08-25 LAB — GLUCOSE, CAPILLARY
GLUCOSE-CAPILLARY: 111 mg/dL — AB (ref 70–99)
Glucose-Capillary: 123 mg/dL — ABNORMAL HIGH (ref 70–99)
Glucose-Capillary: 159 mg/dL — ABNORMAL HIGH (ref 70–99)
Glucose-Capillary: 175 mg/dL — ABNORMAL HIGH (ref 70–99)

## 2013-08-25 LAB — SURGICAL PCR SCREEN
MRSA, PCR: NEGATIVE
Staphylococcus aureus: NEGATIVE

## 2013-08-25 LAB — PULMONARY FUNCTION TEST
DL/VA % pred: 106 %
DL/VA: 4.8 ml/min/mmHg/L
DLCO cor % pred: 94 %
DLCO cor: 27.95 ml/min/mmHg
DLCO unc % pred: 97 %
DLCO unc: 28.79 ml/min/mmHg
FEF 25-75 Pre: 4.02 L/sec
FEF2575-%Pred-Pre: 151 %
FEV1-%Pred-Pre: 106 %
FEV1-Pre: 3.05 L
FEV1FVC-%Pred-Pre: 107 %
FEV6-%Pred-Pre: 99 %
FEV6-Pre: 3.52 L
FEV6FVC-%Pred-Pre: 103 %
FVC-%Pred-Pre: 98 %
FVC-Pre: 3.63 L
Pre FEV1/FVC ratio: 84 %
Pre FEV6/FVC Ratio: 99 %
RV % pred: 72 %
RV: 1.56 L
TLC % pred: 88 %
TLC: 5.83 L

## 2013-08-25 LAB — CBC
HCT: 47.1 % (ref 39.0–52.0)
Hemoglobin: 15.7 g/dL (ref 13.0–17.0)
MCH: 29 pg (ref 26.0–34.0)
MCHC: 33.3 g/dL (ref 30.0–36.0)
MCV: 86.9 fL (ref 78.0–100.0)
Platelets: 171 10*3/uL (ref 150–400)
RBC: 5.42 MIL/uL (ref 4.22–5.81)
RDW: 13.5 % (ref 11.5–15.5)
WBC: 8.2 10*3/uL (ref 4.0–10.5)

## 2013-08-25 LAB — BASIC METABOLIC PANEL
Anion gap: 10 (ref 5–15)
BUN: 12 mg/dL (ref 6–23)
CO2: 26 mEq/L (ref 19–32)
Calcium: 8.9 mg/dL (ref 8.4–10.5)
Chloride: 105 mEq/L (ref 96–112)
Creatinine, Ser: 1.15 mg/dL (ref 0.50–1.35)
GFR calc Af Amer: 78 mL/min — ABNORMAL LOW (ref >90)
GFR calc non Af Amer: 67 mL/min — ABNORMAL LOW (ref >90)
Glucose, Bld: 144 mg/dL — ABNORMAL HIGH (ref 70–99)
Potassium: 4.7 mEq/L (ref 3.7–5.3)
Sodium: 141 mEq/L (ref 137–147)

## 2013-08-25 LAB — TYPE AND SCREEN
ABO/RH(D): A POS
Antibody Screen: NEGATIVE

## 2013-08-25 LAB — HEPARIN LEVEL (UNFRACTIONATED)
Heparin Unfractionated: 0.1 IU/mL — ABNORMAL LOW (ref 0.30–0.70)
Heparin Unfractionated: 0.17 IU/mL — ABNORMAL LOW (ref 0.30–0.70)

## 2013-08-25 LAB — ABO/RH: ABO/RH(D): A POS

## 2013-08-25 LAB — PROTIME-INR
INR: 1.04 (ref 0.00–1.49)
Prothrombin Time: 13.6 seconds (ref 11.6–15.2)

## 2013-08-25 SURGERY — LEFT HEART CATHETERIZATION WITH CORONARY ANGIOGRAM
Anesthesia: LOCAL

## 2013-08-25 MED ORDER — MAGNESIUM SULFATE 50 % IJ SOLN
40.0000 meq | INTRAMUSCULAR | Status: DC
  Filled 2013-08-25: qty 10

## 2013-08-25 MED ORDER — ALPRAZOLAM 0.25 MG PO TABS: 0.2500 mg | ORAL_TABLET | ORAL | Status: DC | PRN

## 2013-08-25 MED ORDER — VANCOMYCIN HCL 10 G IV SOLR
1500.0000 mg | INTRAVENOUS | Status: AC
  Administered 2013-08-26: 1500 mg via INTRAVENOUS
  Filled 2013-08-25: qty 1500

## 2013-08-25 MED ORDER — SODIUM CHLORIDE 0.9 % IV SOLN
INTRAVENOUS | Status: AC
  Administered 2013-08-26: 1 [IU]/h via INTRAVENOUS
  Administered 2013-08-26: 2.7 [IU]/h via INTRAVENOUS
  Filled 2013-08-25: qty 1

## 2013-08-25 MED ORDER — METOPROLOL TARTRATE 12.5 MG HALF TABLET
12.5000 mg | ORAL_TABLET | Freq: Once | ORAL | Status: AC
  Administered 2013-08-26: 12.5 mg via ORAL
  Filled 2013-08-25: qty 1

## 2013-08-25 MED ORDER — DEXTROSE 5 % IV SOLN
1.5000 g | INTRAVENOUS | Status: AC
  Administered 2013-08-26: .75 g via INTRAVENOUS
  Administered 2013-08-26: 1.5 g via INTRAVENOUS
  Filled 2013-08-25 (×2): qty 1.5

## 2013-08-25 MED ORDER — DEXTROSE 5 % IV SOLN
30.0000 ug/min | INTRAVENOUS | Status: DC
  Filled 2013-08-25: qty 2

## 2013-08-25 MED ORDER — SODIUM CHLORIDE 0.9 % IV SOLN
INTRAVENOUS | Status: DC
  Filled 2013-08-25: qty 40

## 2013-08-25 MED ORDER — POTASSIUM CHLORIDE 2 MEQ/ML IV SOLN
80.0000 meq | INTRAVENOUS | Status: DC
Start: 2013-08-26 — End: 2013-08-26
  Filled 2013-08-25: qty 40

## 2013-08-25 MED ORDER — DEXTROSE 5 % IV SOLN
750.0000 mg | INTRAVENOUS | Status: DC
  Filled 2013-08-25 (×2): qty 750

## 2013-08-25 MED ORDER — DEXMEDETOMIDINE HCL IN NACL 400 MCG/100ML IV SOLN
0.1000 ug/kg/h | INTRAVENOUS | Status: AC
  Administered 2013-08-26: 0.3 ug/kg/h via INTRAVENOUS
  Filled 2013-08-25: qty 100

## 2013-08-25 MED ORDER — DOPAMINE-DEXTROSE 3.2-5 MG/ML-% IV SOLN
2.0000 ug/kg/min | INTRAVENOUS | Status: AC
  Administered 2013-08-26: 3 ug/min via INTRAVENOUS
  Filled 2013-08-25: qty 250

## 2013-08-25 MED ORDER — PLASMA-LYTE 148 IV SOLN
INTRAVENOUS | Status: AC
  Administered 2013-08-26: 10:00:00
  Filled 2013-08-25: qty 2.5

## 2013-08-25 MED ORDER — SODIUM CHLORIDE 0.9 % IV SOLN
INTRAVENOUS | Status: DC
  Filled 2013-08-25: qty 30

## 2013-08-25 MED ORDER — EPINEPHRINE HCL 1 MG/ML IJ SOLN
0.5000 ug/min | INTRAVENOUS | Status: DC
  Filled 2013-08-25: qty 4

## 2013-08-25 MED ORDER — TEMAZEPAM 15 MG PO CAPS: 15.0000 mg | ORAL_CAPSULE | Freq: Once | ORAL | Status: AC | PRN

## 2013-08-25 MED ORDER — NITROGLYCERIN IN D5W 200-5 MCG/ML-% IV SOLN
2.0000 ug/min | INTRAVENOUS | Status: DC
  Filled 2013-08-25: qty 250

## 2013-08-25 NOTE — Progress Notes (Signed)
Discussed sternal precautions, mobility and IS for post op. Voiced understanding. Pt given OHS booklet and guideline. Also given instructions to watch pre-op video. [place], ACSM 1:56 PM 08/25/2013

## 2013-08-25 NOTE — Progress Notes (Signed)
Patient ID: Angel Benson, male   DOB: 12/22/1950, 63 y.o.   MRN: [id number]      [address]       [place] [address]             [phone]                   Procedure(s) (LRB): LEFT HEART CATHETERIZATION WITH CORONARY ANGIOGRAM (N/A)  LOS: 2 days   Subjective: No chest pain today  Objective: Vital signs in last 24 hours: Patient Vitals for the past 24 hrs:  BP Temp Temp src Pulse Resp SpO2  08/25/13 1417 120/68 mmHg 97.4 F (36.3 C) Oral 71 18 99 %  08/25/13 1035 121/69 mmHg - - 80 - -  08/25/13 0347 99/59 mmHg 97.8 F (36.6 C) Oral 67 18 100 %  08/24/13 2118 99/63 mmHg 97.7 F (36.5 C) Oral 76 18 98 %  08/24/13 1800 150/78 mmHg - - 77 18 100 %  08/24/13 1730 131/68 mmHg - - 76 - -  08/24/13 1715 118/74 mmHg - - 74 - -  08/24/13 1704 125/70 mmHg - - 74 18 -    Filed Weights   08/23/13 2349 08/24/13 0245 08/24/13 0344  Weight: 187 lb 3.2 oz (84.913 kg) 186 lb 3.2 oz (84.46 kg) 186 lb 3.2 oz (84.46 kg)    Hemodynamic parameters for last 24 hours:    Intake/Output from previous day: 08/10 0701 - 08/11 0700 In: 480 [P.O.:480] Out: 800 [Urine:800] Intake/Output this shift: Total I/O In: 480 [P.O.:480] Out: -   Scheduled Meds: . aspirin EC  81 mg Oral Daily  . atorvastatin  40 mg Oral q1800  . insulin aspart  0-15 Units Subcutaneous TID WC  . metoprolol tartrate  25 mg Oral BID  . pantoprazole  40 mg Oral Daily  . sodium chloride  3 mL Intravenous Q12H   Continuous Infusions: . heparin 1,000 Units/hr (08/25/13 1040)   PRN Meds:.sodium chloride, acetaminophen, ALPRAZolam, nitroGLYCERIN, ondansetron (ZOFRAN) IV, sodium chloride  General appearance: alert and cooperative Neurologic: intact Heart: regular rate and rhythm, S1, S2 normal, no murmur, click, rub or gallop Lungs: clear to auscultation bilaterally Abdomen: soft, non-tender; bowel sounds normal; no masses,  no organomegaly Extremities: extremities normal, atraumatic, no cyanosis  or edema and Homans sign is negative, no sign of DVT  Lab Results: CBC: Recent Labs  08/24/13 0442 08/25/13 0755  WBC 9.0 8.2  HGB 14.5 15.7  HCT 44.3 47.1  PLT 170 171   BMET:  Recent Labs  08/24/13 0442 08/25/13 0755  NA 143 141  K 4.9 4.7  CL 105 105  CO2 25 26  GLUCOSE 155* 144*  BUN 17 12  CREATININE 1.12 1.15  CALCIUM 8.7 8.9    PT/INR:  Recent Labs  08/25/13 0755  LABPROT 13.6  INR 1.04     Radiology Dg Chest 2 View  08/24/2013   CLINICAL DATA:  Chest pain and shortness of breath.  EXAM: CHEST  2 VIEW  COMPARISON:  None.  FINDINGS: The lungs are well-aerated. Mild peribronchial thickening is noted. There is no evidence of focal opacification, pleural effusion or pneumothorax.  The heart is normal in size; the mediastinal contour is within normal limits. No acute osseous abnormalities are seen.  IMPRESSION: Mild peribronchial thickening noted; lungs remain grossly clear.   Electronically Signed   By: Garald Balding M.D.   On: 08/24/2013 01:00     Assessment/Plan: S/P Procedure(s) (LRB): LEFT  HEART CATHETERIZATION WITH CORONARY ANGIOGRAM (N/A)  for cabg tomorrow The goals risks and alternatives of the planned surgical procedure CABG have been discussed with the patient in detail. The risks of the procedure including death, infection, stroke, myocardial infarction, bleeding, blood transfusion have all been discussed specifically. I have quoted Teresa Pelton a 3 % of perioperative mortality and a complication rate as high as 20%. The patient's questions have been answered.Eliot Popper is willing to proceed with the planned procedure.       Grace Isaac MD 08/25/2013 4:36 PM

## 2013-08-25 NOTE — Progress Notes (Addendum)
[facility] for IV Heparin Indication: chest pain/ACS  No Known Allergies  Patient Measurements: Height: 5\' 9"  (175.3 cm) Weight: 186 lb 3.2 oz (84.46 kg) IBW/kg (Calculated) : 70.7 Heparin Dosing Weight: 75 kg  Vital Signs: Temp: 97.8 F (36.6 C) (08/11 0347) Temp src: Oral (08/11 0347) BP: 99/59 mmHg (08/11 0347) Pulse Rate: 67 (08/11 0347)  Labs:  Recent Labs  08/24/13 0025 08/24/13 0141 08/24/13 0442 08/24/13 0827 08/24/13 1335 08/25/13 0755  HGB 15.6  --  14.5  --   --  15.7  HCT 47.7  --  44.3  --   --  47.1  PLT 197  --  170  --   --  171  APTT  --  25  --   --   --   --   LABPROT  --  13.3 14.0  --   --  13.6  INR  --  1.01 1.08  --   --  1.04  HEPARINUNFRC  --   --   --  0.40  --  0.10*  CREATININE 1.28  --  1.12  --   --  1.15  CKTOTAL  --   --   --   --  205  --   CKMB  --   --   --   --  6.3*  --   TROPONINI  --   --  1.56* 1.81*  --   --     Estimated Creatinine Clearance: 67.5 ml/min (by C-G formula based on Cr of 1.15).   Medical History: Past Medical History  Diagnosis Date  . Diabetes mellitus without complication   . GERD (gastroesophageal reflux disease)   . Hypercholesteremia     Medications:  Scheduled:  . aspirin EC  81 mg Oral Daily  . atorvastatin  40 mg Oral q1800  . insulin aspart  0-15 Units Subcutaneous TID WC  . metoprolol tartrate  25 mg Oral BID  . pantoprazole  40 mg Oral Daily  . pneumococcal 23 valent vaccine  0.5 mL Intramuscular Tomorrow-1000  . sodium chloride  3 mL Intravenous Q12H   Infusions:  . heparin 900 Units/hr (08/25/13 0247)    Assessment: 63 yo M admitted 08/23/2013 with chest pain.  Pharmacy consulted to dose heparin and then to resume 8h post sheath removal.  Heparin level is 0.1 units/ml ~ 5 hours after restart of drip.  Goal of Therapy:  Heparin level 0.3-0.7 units/ml Monitor platelets by anticoagulation protocol: Yes   Plan:   Increase heparin drip to  1000 units/hr  Check HL 6 hours after rate change  Daily CBC/HL   Thank you for allowing pharmacy to be a part of this patients care team.  [place].D Clinical Pharmacist 08/25/2013 9:07 AM Pager: [phone] Phone: [phone]   Addum:  Heparin level remains subtherapeutic @ 0.17 units/ml.  No problems noted with infusion per RN.  Will increase drip to 1200 unit/hr.  Check HL ~ 6 hours after rate change.  Thanks, Parke Simmers

## 2013-08-25 NOTE — Care Management Note (Unsigned)
    Page 1 of 1   08/25/2013     4:22:12 PM CARE MANAGEMENT NOTE 08/25/2013  Patient:  Angel Benson, Angel Benson   Account Number:  [id number]  Date Initiated:  08/25/2013  Documentation initiated by:  Dennison Mcdaid  Subjective/Objective Assessment:   PT adm on 08/23/13 with CP, severe 3 vessel disease.  CABG planned for 08/26/13.  PTA, pt independent, lives alone.     Action/Plan:   Pt has large family, and is aware he will need someone to stay with him at dc.  Will follow for dc needs as pt progresses.   Anticipated DC Date:  08/31/2013   Anticipated DC Plan:  [place]  CM consult      Choice offered to / List presented to:             Status of service:  In process, will continue to follow Medicare Important Message given?   (If response is "NO", the following Medicare IM given date fields will be blank) Date Medicare IM given:   Medicare IM given by:   Date Additional Medicare IM given:   Additional Medicare IM given by:    Discharge Disposition:    Per UR Regulation:  Reviewed for med. necessity/level of care/duration of stay  If discussed at [place] of Stay Meetings, dates discussed:    Comments:

## 2013-08-25 NOTE — Progress Notes (Signed)
Patient ID: Angel Benson, male   DOB: September 16, 1950, 63 y.o.   MRN: [id number]    Patient: Angel Benson / Admit Date: 08/23/2013 / Date of Encounter: 08/25/2013, 1:30 PM   Subjective: Doing well. No further chest pain since admission. Cath showed multivessel CAD. Appreciate Dr. Everrett Coombe evaluation. Plan for CABG tomorrow.  Objective: Telemetry: NSR, HR 70-80.  Physical Exam: Blood pressure 121/69, pulse 80, temperature 97.8 F (36.6 C), temperature source Oral, resp. rate 18, height 5\' 9"  (1.753 m), weight 186 lb 3.2 oz (84.46 kg), SpO2 100.00%. General: Well developed, well nourished, in no acute distress.Pleasant.  Head: Normocephalic, atraumatic, sclera non-icteric, no xanthomas, nares are without discharge. Neck: Negative for carotid bruits. JVP not elevated. Lungs: Clear bilaterally to auscultation without wheezes, rales, or rhonchi. Breathing is unlabored. Heart: RRR S1 S2 without murmurs, rubs, or gallops.  Abdomen: Soft, non-tender, non-distended with normoactive bowel sounds. No rebound/guarding. Extremities: No clubbing or cyanosis. No edema. Distal pedal pulses are 2+ and equal bilaterally. Neuro: Alert and oriented X 3. Moves all extremities spontaneously. Psych:  Responds to questions appropriately with a normal affect.   Intake/Output Summary (Last 24 hours) at 08/25/13 1330 Last data filed at 08/25/13 0800  Gross per 24 hour  Intake    720 ml  Output    800 ml  Net    -80 ml    Inpatient Medications:  . aspirin EC  81 mg Oral Daily  . atorvastatin  40 mg Oral q1800  . insulin aspart  0-15 Units Subcutaneous TID WC  . metoprolol tartrate  25 mg Oral BID  . pantoprazole  40 mg Oral Daily  . pneumococcal 23 valent vaccine  0.5 mL Intramuscular Tomorrow-1000  . sodium chloride  3 mL Intravenous Q12H   Infusions:  . heparin 1,000 Units/hr (08/25/13 1040)    Labs:  Recent Labs  08/24/13 0442 08/25/13 0755  NA 143 141  K 4.9 4.7  CL 105 105  CO2 25 26    GLUCOSE 155* 144*  BUN 17 12  CREATININE 1.12 1.15  CALCIUM 8.7 8.9   No results found for this basename: AST, ALT, ALKPHOS, BILITOT, PROT, ALBUMIN,  in the last 72 hours  Recent Labs  08/24/13 0025 08/24/13 0442 08/25/13 0755  WBC 8.7 9.0 8.2  NEUTROABS 4.6  --   --   HGB 15.6 14.5 15.7  HCT 47.7 44.3 47.1  MCV 84.9 85.5 86.9  PLT 197 170 171    Recent Labs  08/24/13 0442 08/24/13 0827 08/24/13 1335  CKTOTAL  --   --  205  CKMB  --   --  6.3*  TROPONINI 1.56* 1.81*  --    No components found with this basename: POCBNP,   Recent Labs  08/24/13 0442  HGBA1C 7.4*     Radiology/Studies:  Dg Chest 2 View  08/24/2013   CLINICAL DATA:  Chest pain and shortness of breath.  EXAM: CHEST  2 VIEW  COMPARISON:  None.  FINDINGS: The lungs are well-aerated. Mild peribronchial thickening is noted. There is no evidence of focal opacification, pleural effusion or pneumothorax.  The heart is normal in size; the mediastinal contour is within normal limits. No acute osseous abnormalities are seen.  IMPRESSION: Mild peribronchial thickening noted; lungs remain grossly clear.   Electronically Signed   By: Garald Balding M.D.   On: 08/24/2013 01:00     Assessment and Plan  Problem List:  1. NSTEMI 2. HTN 3. Strong FH CAD 4. DM2  5. Hyperlipidemia  63 y/o male with PMHx of HTN, HL, DM2 who presented to [facility] on 08/24/2013 with chest and was found to have EKG changes (TWI anteriorly and lateral depression) and elevated troponin (1.56) consistent with NSTEMI.    1) NSTEMI -Multivessel CAD, plan for CABG tomorrow  2) HTN -Well controlled -Continue current medications  3) DM2 -Metformin has been held in preparation for possible cath, on SSI -HA1C still pending -This is a recent dx for him, he is unable to tell me what his prior A1C has been  4) Hyperlipidemia -On lipitor 40 mg  -He does not he a healthy diet   I had a chance to meet Angel Benson today - he was scheduled  to see me, but came in with chest pain before our appointment. He wishes to follow-up with me since the appointment is still scheduled in September at our [facility] office (if that is okay with Dr. Acie Fredrickson, since he has been seeing him).  Pixie Casino, MD, [facility] Attending Cardiologist [facility]   08/25/2013 1:30 PM

## 2013-08-25 NOTE — Progress Notes (Addendum)
VASCULAR LAB PRELIMINARY  PRELIMINARY  PRELIMINARY  PRELIMINARY  Pre-op Cardiac Surgery  Carotid Findings:  Bilateral:  1-39% ICA stenosis.  Vertebral artery flow is antegrade.      Upper Extremity Right Left  Brachial Pressures 129 Triphasic 118 Triphasic  Radial Waveforms Triphasic Triphasic  Ulnar Waveforms Triphasic Triphasic  Palmar Arch (Allen's Test) Normal Abnormal   Findings:  Doppler waveforms remained normal on the right with both radial and ulnar compressions/ Left Doppler waveforms diminished 50% with radial compressions and remained normal with ulnar compressions.    Lower  Extremity Right Left  Dorsalis Pedis 120 Biphasic 134 Biphasic  Posterior Tibial 128 Biphasic 136 Biphasic  Ankle/Brachial Indices 0.99 1.05    Findings:  ABIs indicate normal arterial flow bilaterally at rest.   Maleeyah Mccaughey, RVS 08/25/2013, 12:56 PM

## 2013-08-26 ENCOUNTER — Encounter (HOSPITAL_COMMUNITY)
Admission: EM | Disposition: A | Discharge status: Home / Self Care | Payer: 59 | Source: Home / Self Care | Attending: Cardiothoracic Surgery

## 2013-08-26 ENCOUNTER — Encounter (HOSPITAL_COMMUNITY): Payer: 59 | Admitting: Anesthesiology

## 2013-08-26 ENCOUNTER — Encounter (HOSPITAL_COMMUNITY): Payer: Self-pay | Admitting: Anesthesiology

## 2013-08-26 ENCOUNTER — Inpatient Hospital Stay (HOSPITAL_COMMUNITY): Payer: 59 | Admitting: Anesthesiology

## 2013-08-26 ENCOUNTER — Inpatient Hospital Stay (HOSPITAL_COMMUNITY): Payer: 59

## 2013-08-26 DIAGNOSIS — Z951 Presence of aortocoronary bypass graft: Secondary | ICD-10-CM

## 2013-08-26 HISTORY — PX: CORONARY ARTERY BYPASS GRAFT: SHX141

## 2013-08-26 HISTORY — PX: INTRAOPERATIVE TRANSESOPHAGEAL ECHOCARDIOGRAM: SHX5062

## 2013-08-26 LAB — POCT I-STAT 3, ART BLOOD GAS (G3+)
ACID-BASE DEFICIT: 1 mmol/L (ref 0.0–2.0)
ACID-BASE EXCESS: 1 mmol/L (ref 0.0–2.0)
Acid-Base Excess: 1 mmol/L (ref 0.0–2.0)
BICARBONATE: 26.3 meq/L — AB (ref 20.0–24.0)
BICARBONATE: 25.4 meq/L — AB (ref 20.0–24.0)
BICARBONATE: 24.3 meq/L — AB (ref 20.0–24.0)
BICARBONATE: 23.2 meq/L (ref 20.0–24.0)
Bicarbonate: 24.7 mEq/L — ABNORMAL HIGH (ref 20.0–24.0)
O2 SAT: 100 %
O2 SAT: 83 %
O2 Saturation: 100 %
O2 Saturation: 100 %
O2 Saturation: 98 %
PCO2 ART: 34 mmHg — AB (ref 35.0–45.0)
PCO2 ART: 32.2 mmHg — AB (ref 35.0–45.0)
PO2 ART: 98 mmHg (ref 80.0–100.0)
TCO2: 28 mmol/L (ref 0–100)
TCO2: 26 mmol/L (ref 0–100)
TCO2: 27 mmol/L (ref 0–100)
TCO2: 25 mmol/L (ref 0–100)
TCO2: 24 mmol/L (ref 0–100)
pCO2 arterial: 46.2 mmHg — ABNORMAL HIGH (ref 35.0–45.0)
pCO2 arterial: 40.5 mmHg (ref 35.0–45.0)
pCO2 arterial: 45.1 mmHg — ABNORMAL HIGH (ref 35.0–45.0)
pH, Arterial: 7.364 (ref 7.350–7.450)
pH, Arterial: 7.347 — ABNORMAL LOW (ref 7.350–7.450)
pH, Arterial: 7.406 (ref 7.350–7.450)
pH, Arterial: 7.463 — ABNORMAL HIGH (ref 7.350–7.450)
pH, Arterial: 7.466 — ABNORMAL HIGH (ref 7.350–7.450)
pO2, Arterial: 574 mmHg — ABNORMAL HIGH (ref 80.0–100.0)
pO2, Arterial: 459 mmHg — ABNORMAL HIGH (ref 80.0–100.0)
pO2, Arterial: 51 mmHg — ABNORMAL LOW (ref 80.0–100.0)
pO2, Arterial: 274 mmHg — ABNORMAL HIGH (ref 80.0–100.0)

## 2013-08-26 LAB — HEMOGLOBIN AND HEMATOCRIT, BLOOD
HCT: 28.3 % — ABNORMAL LOW (ref 39.0–52.0)
Hemoglobin: 9.7 g/dL — ABNORMAL LOW (ref 13.0–17.0)

## 2013-08-26 LAB — POCT I-STAT, CHEM 8
BUN: 10 mg/dL (ref 6–23)
BUN: 10 mg/dL (ref 6–23)
BUN: 8 mg/dL (ref 6–23)
BUN: 9 mg/dL (ref 6–23)
BUN: 8 mg/dL (ref 6–23)
BUN: 7 mg/dL (ref 6–23)
CHLORIDE: 107 meq/L (ref 96–112)
CHLORIDE: 99 meq/L (ref 96–112)
CHLORIDE: 101 meq/L (ref 96–112)
CHLORIDE: 105 meq/L (ref 96–112)
CREATININE: 0.8 mg/dL (ref 0.50–1.35)
Calcium, Ion: 1.21 mmol/L (ref 1.13–1.30)
Calcium, Ion: 1.18 mmol/L (ref 1.13–1.30)
Calcium, Ion: 0.99 mmol/L — ABNORMAL LOW (ref 1.13–1.30)
Calcium, Ion: 1.01 mmol/L — ABNORMAL LOW (ref 1.13–1.30)
Calcium, Ion: 1.02 mmol/L — ABNORMAL LOW (ref 1.13–1.30)
Calcium, Ion: 1.11 mmol/L — ABNORMAL LOW (ref 1.13–1.30)
Chloride: 104 mEq/L (ref 96–112)
Chloride: 104 mEq/L (ref 96–112)
Creatinine, Ser: 1 mg/dL (ref 0.50–1.35)
Creatinine, Ser: 0.9 mg/dL (ref 0.50–1.35)
Creatinine, Ser: 0.9 mg/dL (ref 0.50–1.35)
Creatinine, Ser: 0.9 mg/dL (ref 0.50–1.35)
Creatinine, Ser: 1 mg/dL (ref 0.50–1.35)
GLUCOSE: 150 mg/dL — AB (ref 70–99)
GLUCOSE: 113 mg/dL — AB (ref 70–99)
GLUCOSE: 100 mg/dL — AB (ref 70–99)
Glucose, Bld: 114 mg/dL — ABNORMAL HIGH (ref 70–99)
Glucose, Bld: 153 mg/dL — ABNORMAL HIGH (ref 70–99)
Glucose, Bld: 156 mg/dL — ABNORMAL HIGH (ref 70–99)
HCT: 28 % — ABNORMAL LOW (ref 39.0–52.0)
HEMATOCRIT: 39 % (ref 39.0–52.0)
HEMATOCRIT: 37 % — AB (ref 39.0–52.0)
HEMATOCRIT: 29 % — AB (ref 39.0–52.0)
HEMATOCRIT: 27 % — AB (ref 39.0–52.0)
HEMATOCRIT: 35 % — AB (ref 39.0–52.0)
HEMOGLOBIN: 13.3 g/dL (ref 13.0–17.0)
HEMOGLOBIN: 9.9 g/dL — AB (ref 13.0–17.0)
HEMOGLOBIN: 11.9 g/dL — AB (ref 13.0–17.0)
Hemoglobin: 12.6 g/dL — ABNORMAL LOW (ref 13.0–17.0)
Hemoglobin: 9.5 g/dL — ABNORMAL LOW (ref 13.0–17.0)
Hemoglobin: 9.2 g/dL — ABNORMAL LOW (ref 13.0–17.0)
POTASSIUM: 4.4 meq/L (ref 3.7–5.3)
Potassium: 4.1 mEq/L (ref 3.7–5.3)
Potassium: 4.3 mEq/L (ref 3.7–5.3)
Potassium: 4.5 mEq/L (ref 3.7–5.3)
Potassium: 4.6 mEq/L (ref 3.7–5.3)
Potassium: 3.8 mEq/L (ref 3.7–5.3)
SODIUM: 139 meq/L (ref 137–147)
SODIUM: 136 meq/L — AB (ref 137–147)
SODIUM: 138 meq/L (ref 137–147)
Sodium: 139 mEq/L (ref 137–147)
Sodium: 139 mEq/L (ref 137–147)
Sodium: 139 mEq/L (ref 137–147)
TCO2: 25 mmol/L (ref 0–100)
TCO2: 24 mmol/L (ref 0–100)
TCO2: 25 mmol/L (ref 0–100)
TCO2: 25 mmol/L (ref 0–100)
TCO2: 23 mmol/L (ref 0–100)
TCO2: 20 mmol/L (ref 0–100)

## 2013-08-26 LAB — POCT I-STAT 4, (NA,K, GLUC, HGB,HCT)
Glucose, Bld: 127 mg/dL — ABNORMAL HIGH (ref 70–99)
HEMATOCRIT: 31 % — AB (ref 39.0–52.0)
HEMOGLOBIN: 10.5 g/dL — AB (ref 13.0–17.0)
Potassium: 3.5 mEq/L — ABNORMAL LOW (ref 3.7–5.3)
Sodium: 141 mEq/L (ref 137–147)

## 2013-08-26 LAB — GLUCOSE, CAPILLARY
GLUCOSE-CAPILLARY: 118 mg/dL — AB (ref 70–99)
GLUCOSE-CAPILLARY: 120 mg/dL — AB (ref 70–99)
GLUCOSE-CAPILLARY: 129 mg/dL — AB (ref 70–99)
GLUCOSE-CAPILLARY: 135 mg/dL — AB (ref 70–99)
Glucose-Capillary: 115 mg/dL — ABNORMAL HIGH (ref 70–99)
Glucose-Capillary: 86 mg/dL (ref 70–99)
Glucose-Capillary: 110 mg/dL — ABNORMAL HIGH (ref 70–99)
Glucose-Capillary: 125 mg/dL — ABNORMAL HIGH (ref 70–99)
Glucose-Capillary: 129 mg/dL — ABNORMAL HIGH (ref 70–99)

## 2013-08-26 LAB — POCT I-STAT GLUCOSE
Glucose, Bld: 133 mg/dL — ABNORMAL HIGH (ref 70–99)
Operator id: 412561

## 2013-08-26 LAB — MAGNESIUM: Magnesium: 2.8 mg/dL — ABNORMAL HIGH (ref 1.5–2.5)

## 2013-08-26 LAB — BASIC METABOLIC PANEL
Anion gap: 13 (ref 5–15)
BUN: 14 mg/dL (ref 6–23)
CO2: 22 mEq/L (ref 19–32)
Calcium: 8.7 mg/dL (ref 8.4–10.5)
Chloride: 105 mEq/L (ref 96–112)
Creatinine, Ser: 1.19 mg/dL (ref 0.50–1.35)
GFR calc Af Amer: 74 mL/min — ABNORMAL LOW (ref >90)
GFR calc non Af Amer: 64 mL/min — ABNORMAL LOW (ref >90)
Glucose, Bld: 131 mg/dL — ABNORMAL HIGH (ref 70–99)
Potassium: 4.9 mEq/L (ref 3.7–5.3)
Sodium: 140 mEq/L (ref 137–147)

## 2013-08-26 LAB — CBC
HCT: 42.7 % (ref 39.0–52.0)
HCT: 32.6 % — ABNORMAL LOW (ref 39.0–52.0)
Hemoglobin: 14.4 g/dL (ref 13.0–17.0)
Hemoglobin: 11 g/dL — ABNORMAL LOW (ref 13.0–17.0)
MCH: 29.1 pg (ref 26.0–34.0)
MCH: 28.8 pg (ref 26.0–34.0)
MCHC: 33.7 g/dL (ref 30.0–36.0)
MCHC: 33.7 g/dL (ref 30.0–36.0)
MCV: 86.4 fL (ref 78.0–100.0)
MCV: 85.3 fL (ref 78.0–100.0)
Platelets: 163 10*3/uL (ref 150–400)
Platelets: 82 10*3/uL — ABNORMAL LOW (ref 150–400)
RBC: 4.94 MIL/uL (ref 4.22–5.81)
RBC: 3.82 MIL/uL — ABNORMAL LOW (ref 4.22–5.81)
RDW: 13.6 % (ref 11.5–15.5)
RDW: 13.2 % (ref 11.5–15.5)
WBC: 9 10*3/uL (ref 4.0–10.5)
WBC: 8.6 10*3/uL (ref 4.0–10.5)

## 2013-08-26 LAB — APTT: APTT: 27 s (ref 24–37)

## 2013-08-26 LAB — CREATININE, SERUM
Creatinine, Ser: 0.93 mg/dL (ref 0.50–1.35)
GFR calc Af Amer: >90 mL/min (ref >90)
GFR calc non Af Amer: 89 mL/min — ABNORMAL LOW (ref >90)

## 2013-08-26 LAB — PROTIME-INR
INR: 1.42 (ref 0.00–1.49)
Prothrombin Time: 17.4 seconds — ABNORMAL HIGH (ref 11.6–15.2)

## 2013-08-26 LAB — HEPARIN LEVEL (UNFRACTIONATED): Heparin Unfractionated: 0.36 IU/mL (ref 0.30–0.70)

## 2013-08-26 LAB — PLATELET COUNT: Platelets: 88 10*3/uL — ABNORMAL LOW (ref 150–400)

## 2013-08-26 SURGERY — CORONARY ARTERY BYPASS GRAFTING (CABG)
Anesthesia: General | Site: Chest

## 2013-08-26 MED ORDER — HEMOSTATIC AGENTS (NO CHARGE) OPTIME
TOPICAL | Status: DC | PRN
  Administered 2013-08-26: 1 via TOPICAL

## 2013-08-26 MED ORDER — MIDAZOLAM HCL 10 MG/2ML IJ SOLN
INTRAMUSCULAR | Status: AC
  Filled 2013-08-26: qty 2

## 2013-08-26 MED ORDER — MAGNESIUM SULFATE 4000MG/100ML IJ SOLN
4.0000 g | Freq: Once | INTRAMUSCULAR | Status: AC
  Administered 2013-08-26: 4 g via INTRAVENOUS
  Filled 2013-08-26: qty 100

## 2013-08-26 MED ORDER — ALBUMIN HUMAN 5 % IV SOLN
250.0000 mL | INTRAVENOUS | Status: AC | PRN
  Administered 2013-08-26 (×4): 250 mL via INTRAVENOUS
  Filled 2013-08-26 (×2): qty 250

## 2013-08-26 MED ORDER — NITROGLYCERIN IN D5W 200-5 MCG/ML-% IV SOLN: 0.0000 ug/min | INTRAVENOUS | Status: DC

## 2013-08-26 MED ORDER — SODIUM CHLORIDE 0.9 % IJ SOLN
OROMUCOSAL | Status: DC | PRN
  Administered 2013-08-26 (×3): via TOPICAL

## 2013-08-26 MED ORDER — LACTATED RINGERS IV SOLN
INTRAVENOUS | Status: DC | PRN
  Administered 2013-08-26: 08:00:00 via INTRAVENOUS

## 2013-08-26 MED ORDER — FENTANYL CITRATE 0.05 MG/ML IJ SOLN
INTRAMUSCULAR | Status: DC | PRN
  Administered 2013-08-26: 50 ug via INTRAVENOUS
  Administered 2013-08-26: 150 ug via INTRAVENOUS
  Administered 2013-08-26 (×2): 50 ug via INTRAVENOUS
  Administered 2013-08-26: 100 ug via INTRAVENOUS
  Administered 2013-08-26: 150 ug via INTRAVENOUS
  Administered 2013-08-26 (×5): 100 ug via INTRAVENOUS
  Administered 2013-08-26 (×3): 150 ug via INTRAVENOUS

## 2013-08-26 MED ORDER — METOPROLOL TARTRATE 25 MG/10 ML ORAL SUSPENSION
12.5000 mg | Freq: Two times a day (BID) | ORAL | Status: DC
  Filled 2013-08-26 (×7): qty 5

## 2013-08-26 MED ORDER — FENTANYL CITRATE 0.05 MG/ML IJ SOLN
INTRAMUSCULAR | Status: AC
  Filled 2013-08-26: qty 5

## 2013-08-26 MED ORDER — DOPAMINE-DEXTROSE 3.2-5 MG/ML-% IV SOLN: 2.0000 ug/kg/min | INTRAVENOUS | Status: AC

## 2013-08-26 MED ORDER — ALBUMIN HUMAN 5 % IV SOLN
INTRAVENOUS | Status: DC | PRN
  Administered 2013-08-26: 14:00:00 via INTRAVENOUS

## 2013-08-26 MED ORDER — ACETAMINOPHEN 650 MG RE SUPP
650.0000 mg | Freq: Once | RECTAL | Status: AC
  Administered 2013-08-26: 650 mg via RECTAL

## 2013-08-26 MED ORDER — MIDAZOLAM HCL 5 MG/5ML IJ SOLN
INTRAMUSCULAR | Status: DC | PRN
  Administered 2013-08-26: 1 mg via INTRAVENOUS
  Administered 2013-08-26: 2 mg via INTRAVENOUS
  Administered 2013-08-26: 1 mg via INTRAVENOUS
  Administered 2013-08-26: 3 mg via INTRAVENOUS
  Administered 2013-08-26 (×3): 1 mg via INTRAVENOUS

## 2013-08-26 MED ORDER — PANTOPRAZOLE SODIUM 40 MG PO TBEC
40.0000 mg | DELAYED_RELEASE_TABLET | Freq: Every day | ORAL | Status: DC
  Administered 2013-08-28 – 2013-08-31 (×4): 40 mg via ORAL
  Filled 2013-08-26 (×4): qty 1

## 2013-08-26 MED ORDER — 0.9 % SODIUM CHLORIDE (POUR BTL) OPTIME
TOPICAL | Status: DC | PRN
  Administered 2013-08-26: 5000 mL

## 2013-08-26 MED ORDER — METOPROLOL TARTRATE 12.5 MG HALF TABLET
12.5000 mg | ORAL_TABLET | Freq: Two times a day (BID) | ORAL | Status: DC
  Filled 2013-08-26 (×7): qty 1

## 2013-08-26 MED ORDER — METOPROLOL TARTRATE 1 MG/ML IV SOLN: 2.5000 mg | INTRAVENOUS | Status: DC | PRN

## 2013-08-26 MED ORDER — PROPOFOL 10 MG/ML IV BOLUS
INTRAVENOUS | Status: AC
  Filled 2013-08-26: qty 20

## 2013-08-26 MED ORDER — DOCUSATE SODIUM 100 MG PO CAPS
200.0000 mg | ORAL_CAPSULE | Freq: Every day | ORAL | Status: DC
  Administered 2013-08-27 – 2013-08-30 (×4): 200 mg via ORAL
  Filled 2013-08-26 (×5): qty 2

## 2013-08-26 MED ORDER — ARTIFICIAL TEARS OP OINT
TOPICAL_OINTMENT | OPHTHALMIC | Status: DC | PRN
  Administered 2013-08-26: 1 via OPHTHALMIC

## 2013-08-26 MED ORDER — SODIUM CHLORIDE 0.9 % IV SOLN
10.0000 g | INTRAVENOUS | Status: DC | PRN
  Administered 2013-08-26: 5 g/h via INTRAVENOUS

## 2013-08-26 MED ORDER — NITROGLYCERIN IN D5W 200-5 MCG/ML-% IV SOLN
INTRAVENOUS | Status: DC | PRN
  Administered 2013-08-26: 16.6 ug/min via INTRAVENOUS

## 2013-08-26 MED ORDER — POTASSIUM CHLORIDE 10 MEQ/50ML IV SOLN
10.0000 meq | INTRAVENOUS | Status: AC
  Administered 2013-08-26 (×3): 10 meq via INTRAVENOUS

## 2013-08-26 MED ORDER — ONDANSETRON HCL 4 MG/2ML IJ SOLN
4.0000 mg | Freq: Four times a day (QID) | INTRAMUSCULAR | Status: DC | PRN
  Filled 2013-08-26: qty 2

## 2013-08-26 MED ORDER — HEPARIN SODIUM (PORCINE) 1000 UNIT/ML IJ SOLN
INTRAMUSCULAR | Status: DC | PRN
  Administered 2013-08-26: 25000 [IU] via INTRAVENOUS

## 2013-08-26 MED ORDER — LACTATED RINGERS IV SOLN
INTRAVENOUS | Status: DC | PRN
  Administered 2013-08-26 (×2): via INTRAVENOUS

## 2013-08-26 MED ORDER — SODIUM CHLORIDE 0.45 % IV SOLN: INTRAVENOUS | Status: DC

## 2013-08-26 MED ORDER — MORPHINE SULFATE 2 MG/ML IJ SOLN: 1.0000 mg | INTRAMUSCULAR | Status: AC | PRN

## 2013-08-26 MED ORDER — SODIUM CHLORIDE 0.9 % IJ SOLN: 3.0000 mL | INTRAMUSCULAR | Status: DC | PRN

## 2013-08-26 MED ORDER — ACETAMINOPHEN 160 MG/5ML PO SOLN: 650.0000 mg | Freq: Once | ORAL | Status: AC

## 2013-08-26 MED ORDER — SODIUM CHLORIDE 0.9 % IJ SOLN
3.0000 mL | Freq: Two times a day (BID) | INTRAMUSCULAR | Status: DC
  Administered 2013-08-27: 3 mL via INTRAVENOUS
  Administered 2013-08-27: 10 mL via INTRAVENOUS
  Administered 2013-08-28 – 2013-08-29 (×3): 3 mL via INTRAVENOUS

## 2013-08-26 MED ORDER — LACTATED RINGERS IV SOLN
INTRAVENOUS | Status: DC | PRN
  Administered 2013-08-26 (×3): via INTRAVENOUS

## 2013-08-26 MED ORDER — SODIUM CHLORIDE 0.9 % IV SOLN
1.0000 g/h | INTRAVENOUS | Status: DC
  Filled 2013-08-26: qty 40

## 2013-08-26 MED ORDER — VECURONIUM BROMIDE 10 MG IV SOLR
INTRAVENOUS | Status: DC | PRN
  Administered 2013-08-26 (×2): 2 mg via INTRAVENOUS
  Administered 2013-08-26 (×2): 3 mg via INTRAVENOUS

## 2013-08-26 MED ORDER — SODIUM CHLORIDE 0.9 % IV SOLN
INTRAVENOUS | Status: DC
  Administered 2013-08-26: 1.8 [IU]/h via INTRAVENOUS
  Administered 2013-08-26: 1.5 [IU]/h via INTRAVENOUS
  Filled 2013-08-26 (×3): qty 1

## 2013-08-26 MED ORDER — MIDAZOLAM HCL 2 MG/2ML IJ SOLN: 2.0000 mg | INTRAMUSCULAR | Status: DC | PRN

## 2013-08-26 MED ORDER — PROTAMINE SULFATE 10 MG/ML IV SOLN
INTRAVENOUS | Status: DC | PRN
  Administered 2013-08-26: 250 mg via INTRAVENOUS

## 2013-08-26 MED ORDER — ROCURONIUM BROMIDE 100 MG/10ML IV SOLN
INTRAVENOUS | Status: DC | PRN
  Administered 2013-08-26 (×2): 50 mg via INTRAVENOUS

## 2013-08-26 MED ORDER — EPHEDRINE SULFATE 50 MG/ML IJ SOLN
INTRAMUSCULAR | Status: DC | PRN
  Administered 2013-08-26: 5 mg via INTRAVENOUS

## 2013-08-26 MED ORDER — BISACODYL 5 MG PO TBEC
10.0000 mg | DELAYED_RELEASE_TABLET | Freq: Every day | ORAL | Status: DC
  Administered 2013-08-27 – 2013-08-30 (×4): 10 mg via ORAL
  Filled 2013-08-26 (×4): qty 2

## 2013-08-26 MED ORDER — INSULIN REGULAR BOLUS VIA INFUSION
0.0000 [IU] | Freq: Three times a day (TID) | INTRAVENOUS | Status: DC
  Filled 2013-08-26: qty 10

## 2013-08-26 MED ORDER — ACETAMINOPHEN 160 MG/5ML PO SOLN
1000.0000 mg | Freq: Four times a day (QID) | ORAL | Status: DC
  Administered 2013-08-26: 1000 mg
  Filled 2013-08-26: qty 40

## 2013-08-26 MED ORDER — DEXTROSE 5 % IV SOLN
1.5000 g | Freq: Two times a day (BID) | INTRAVENOUS | Status: AC
  Administered 2013-08-26 – 2013-08-28 (×4): 1.5 g via INTRAVENOUS
  Filled 2013-08-26 (×4): qty 1.5

## 2013-08-26 MED ORDER — ACETAMINOPHEN 500 MG PO TABS
1000.0000 mg | ORAL_TABLET | Freq: Four times a day (QID) | ORAL | Status: DC
  Administered 2013-08-27 – 2013-08-31 (×15): 1000 mg via ORAL
  Filled 2013-08-26 (×21): qty 2

## 2013-08-26 MED ORDER — ASPIRIN 81 MG PO CHEW
324.0000 mg | CHEWABLE_TABLET | Freq: Every day | ORAL | Status: DC
  Filled 2013-08-26: qty 4

## 2013-08-26 MED ORDER — LACTATED RINGERS IV SOLN: 500.0000 mL | Freq: Once | INTRAVENOUS | Status: AC | PRN

## 2013-08-26 MED ORDER — CHLORHEXIDINE GLUCONATE CLOTH 2 % EX PADS: 6.0000 | MEDICATED_PAD | Freq: Once | CUTANEOUS | Status: AC

## 2013-08-26 MED ORDER — SODIUM CHLORIDE 0.9 % IV SOLN
INTRAVENOUS | Status: DC
  Filled 2013-08-26: qty 100

## 2013-08-26 MED ORDER — CHLORHEXIDINE GLUCONATE CLOTH 2 % EX PADS
6.0000 | MEDICATED_PAD | Freq: Once | CUTANEOUS | Status: AC
  Administered 2013-08-26: 6 via TOPICAL

## 2013-08-26 MED ORDER — ASPIRIN EC 325 MG PO TBEC
325.0000 mg | DELAYED_RELEASE_TABLET | Freq: Every day | ORAL | Status: DC
  Administered 2013-08-27 – 2013-08-31 (×5): 325 mg via ORAL
  Filled 2013-08-26 (×5): qty 1

## 2013-08-26 MED ORDER — FAMOTIDINE IN NACL 20-0.9 MG/50ML-% IV SOLN
20.0000 mg | Freq: Two times a day (BID) | INTRAVENOUS | Status: AC
  Administered 2013-08-26 (×2): 20 mg via INTRAVENOUS
  Filled 2013-08-26: qty 50

## 2013-08-26 MED ORDER — SODIUM CHLORIDE 0.9 % IV SOLN: 250.0000 mL | INTRAVENOUS | Status: DC

## 2013-08-26 MED ORDER — LACTATED RINGERS IV SOLN: INTRAVENOUS | Status: DC

## 2013-08-26 MED ORDER — VANCOMYCIN HCL IN DEXTROSE 1-5 GM/200ML-% IV SOLN
1000.0000 mg | Freq: Once | INTRAVENOUS | Status: AC
  Administered 2013-08-26: 1000 mg via INTRAVENOUS
  Filled 2013-08-26: qty 200

## 2013-08-26 MED ORDER — PHENYLEPHRINE HCL 10 MG/ML IJ SOLN
10.0000 mg | INTRAVENOUS | Status: DC | PRN
  Administered 2013-08-26: 50 ug/min via INTRAVENOUS

## 2013-08-26 MED ORDER — PROPOFOL 10 MG/ML IV BOLUS
INTRAVENOUS | Status: DC | PRN
  Administered 2013-08-26: 20 mg via INTRAVENOUS
  Administered 2013-08-26: 100 mg via INTRAVENOUS

## 2013-08-26 MED ORDER — PHENYLEPHRINE HCL 10 MG/ML IJ SOLN
0.0000 ug/min | INTRAVENOUS | Status: DC
  Administered 2013-08-27: 60 ug/min via INTRAVENOUS
  Administered 2013-08-27: 50 ug/min via INTRAVENOUS
  Administered 2013-08-28: 55 ug/min via INTRAVENOUS
  Filled 2013-08-26 (×5): qty 2

## 2013-08-26 MED ORDER — DEXMEDETOMIDINE HCL IN NACL 400 MCG/100ML IV SOLN
0.4000 ug/kg/h | INTRAVENOUS | Status: DC
  Filled 2013-08-26: qty 100

## 2013-08-26 MED ORDER — SODIUM CHLORIDE 0.9 % IV SOLN: 10.0000 g | INTRAVENOUS | Status: DC

## 2013-08-26 MED ORDER — OXYCODONE HCL 5 MG PO TABS
5.0000 mg | ORAL_TABLET | ORAL | Status: DC | PRN
  Administered 2013-08-27 – 2013-08-29 (×4): 10 mg via ORAL
  Administered 2013-08-30: 5 mg via ORAL
  Administered 2013-08-31: 10 mg via ORAL
  Filled 2013-08-26 (×4): qty 2
  Filled 2013-08-26: qty 1
  Filled 2013-08-26: qty 2

## 2013-08-26 MED ORDER — NITROGLYCERIN 0.2 MG/ML ON CALL CATH LAB
INTRAVENOUS | Status: DC | PRN
  Administered 2013-08-26: 16 ug via INTRAVENOUS

## 2013-08-26 MED ORDER — MORPHINE SULFATE 2 MG/ML IJ SOLN
2.0000 mg | INTRAMUSCULAR | Status: DC | PRN
  Administered 2013-08-27 – 2013-08-28 (×4): 2 mg via INTRAVENOUS
  Filled 2013-08-26 (×4): qty 1

## 2013-08-26 MED ORDER — POTASSIUM CHLORIDE 10 MEQ/50ML IV SOLN
10.0000 meq | Freq: Once | INTRAVENOUS | Status: AC
Start: 2013-08-26 — End: 2013-08-26
  Administered 2013-08-26: 10 meq via INTRAVENOUS

## 2013-08-26 MED ORDER — BISACODYL 10 MG RE SUPP: 10.0000 mg | Freq: Every day | RECTAL | Status: DC

## 2013-08-26 MED ORDER — LIDOCAINE HCL (CARDIAC) 20 MG/ML IV SOLN
INTRAVENOUS | Status: DC | PRN
  Administered 2013-08-26: 40 mg via INTRAVENOUS

## 2013-08-26 MED ORDER — SODIUM CHLORIDE 0.9 % IV SOLN: INTRAVENOUS | Status: DC

## 2013-08-26 MED ORDER — DEXMEDETOMIDINE HCL IN NACL 200 MCG/50ML IV SOLN: 0.1000 ug/kg/h | INTRAVENOUS | Status: DC

## 2013-08-26 SURGICAL SUPPLY — 78 items
ATTRACTOMAT 16X20 MAGNETIC DRP (DRAPES) ×3 IMPLANT
BAG DECANTER FOR FLEXI CONT (MISCELLANEOUS) ×3 IMPLANT
BANDAGE ELASTIC 4 VELCRO ST LF (GAUZE/BANDAGES/DRESSINGS) ×6 IMPLANT
BANDAGE ELASTIC 6 VELCRO ST LF (GAUZE/BANDAGES/DRESSINGS) ×6 IMPLANT
BLADE STERNUM SYSTEM 6 (BLADE) ×3 IMPLANT
BLADE SURG 15 STRL LF DISP TIS (BLADE) ×2 IMPLANT
BLADE SURG 15 STRL SS (BLADE) ×1
BNDG GAUZE ELAST 4 BULKY (GAUZE/BANDAGES/DRESSINGS) ×6 IMPLANT
CANISTER SUCTION 2500CC (MISCELLANEOUS) ×3 IMPLANT
CARDIAC SUCTION (MISCELLANEOUS) ×3 IMPLANT
CATH CPB KIT GERHARDT (MISCELLANEOUS) ×3 IMPLANT
CATH THORACIC 28FR (CATHETERS) ×3 IMPLANT
CLIP RETRACTION 3.0MM CORONARY (MISCELLANEOUS) ×3 IMPLANT
CLIP TI WIDE RED SMALL 24 (CLIP) ×3 IMPLANT
COVER SURGICAL LIGHT HANDLE (MISCELLANEOUS) ×3 IMPLANT
CRADLE DONUT ADULT HEAD (MISCELLANEOUS) ×3 IMPLANT
DERMABOND ADHESIVE PROPEN (GAUZE/BANDAGES/DRESSINGS) ×1
DERMABOND ADVANCED .7 DNX6 (GAUZE/BANDAGES/DRESSINGS) ×2 IMPLANT
DRAIN CHANNEL 28F RND 3/8 FF (WOUND CARE) ×3 IMPLANT
DRAPE CARDIOVASCULAR INCISE (DRAPES) ×1
DRAPE SLUSH/WARMER DISC (DRAPES) ×3 IMPLANT
DRAPE SRG 135X102X78XABS (DRAPES) ×2 IMPLANT
DRSG AQUACEL AG ADV 3.5X14 (GAUZE/BANDAGES/DRESSINGS) ×3 IMPLANT
ELECT BLADE 4.0 EZ CLEAN MEGAD (MISCELLANEOUS) ×3
ELECT REM PT RETURN 9FT ADLT (ELECTROSURGICAL) ×6
ELECTRODE BLDE 4.0 EZ CLN MEGD (MISCELLANEOUS) ×2 IMPLANT
ELECTRODE REM PT RTRN 9FT ADLT (ELECTROSURGICAL) ×4 IMPLANT
GAUZE SPONGE 4X4 12PLY STRL (GAUZE/BANDAGES/DRESSINGS) ×6 IMPLANT
GLOVE BIO SURGEON STRL SZ 6 (GLOVE) ×9 IMPLANT
GLOVE BIO SURGEON STRL SZ 6.5 (GLOVE) ×9 IMPLANT
GLOVE BIO SURGEON STRL SZ7.5 (GLOVE) ×3 IMPLANT
GLOVE BIOGEL PI IND STRL 6.5 (GLOVE) ×6 IMPLANT
GLOVE BIOGEL PI INDICATOR 6.5 (GLOVE) ×3
GLOVE SURG SS PI 6.5 STRL IVOR (GLOVE) ×6 IMPLANT
GOWN STRL REUS W/ TWL LRG LVL3 (GOWN DISPOSABLE) ×18 IMPLANT
GOWN STRL REUS W/TWL LRG LVL3 (GOWN DISPOSABLE) ×9
HEMOSTAT POWDER SURGIFOAM 1G (HEMOSTASIS) ×9 IMPLANT
HEMOSTAT SURGICEL 2X14 (HEMOSTASIS) ×3 IMPLANT
KIT BASIN OR (CUSTOM PROCEDURE TRAY) ×3 IMPLANT
KIT ROOM TURNOVER OR (KITS) ×3 IMPLANT
KIT SUCTION CATH 14FR (SUCTIONS) ×6 IMPLANT
KIT VASOVIEW ACCESSORY VH 2004 (KITS) ×3 IMPLANT
KIT VASOVIEW W/TROCAR VH 2000 (KITS) ×3 IMPLANT
LEAD PACING MYOCARDI (MISCELLANEOUS) ×3 IMPLANT
MARKER GRAFT CORONARY BYPASS (MISCELLANEOUS) ×9 IMPLANT
NS IRRIG 1000ML POUR BTL (IV SOLUTION) ×15 IMPLANT
PACK OPEN HEART (CUSTOM PROCEDURE TRAY) ×3 IMPLANT
PAD ARMBOARD 7.5X6 YLW CONV (MISCELLANEOUS) ×6 IMPLANT
PAD CARDIAC INSULATION (MISCELLANEOUS) ×3 IMPLANT
PAD ELECT DEFIB RADIOL ZOLL (MISCELLANEOUS) ×3 IMPLANT
PENCIL BUTTON HOLSTER BLD 10FT (ELECTRODE) ×3 IMPLANT
PUNCH AORTIC ROTATE  4.5MM 8IN (MISCELLANEOUS) ×3 IMPLANT
SET CARDIOPLEGIA MPS 5001102 (MISCELLANEOUS) ×3 IMPLANT
SPONGE GAUZE 4X4 12PLY STER LF (GAUZE/BANDAGES/DRESSINGS) ×9 IMPLANT
SPONGE LAP 18X18 X RAY DECT (DISPOSABLE) ×12 IMPLANT
SUT BONE WAX W31G (SUTURE) ×3 IMPLANT
SUT PROLENE 3 0 SH1 36 (SUTURE) ×6 IMPLANT
SUT PROLENE 4 0 TF (SUTURE) ×6 IMPLANT
SUT PROLENE 5 0 C 1 36 (SUTURE) ×3 IMPLANT
SUT PROLENE 6 0 CC (SUTURE) ×9 IMPLANT
SUT PROLENE 7 0 BV1 MDA (SUTURE) ×6 IMPLANT
SUT PROLENE 8 0 BV175 6 (SUTURE) ×15 IMPLANT
SUT STEEL 6MS V (SUTURE) ×3 IMPLANT
SUT STEEL SZ 6 DBL 3X14 BALL (SUTURE) ×3 IMPLANT
SUT VIC AB 1 CTX 18 (SUTURE) ×6 IMPLANT
SUT VIC AB 2-0 CT1 36 (SUTURE) ×9 IMPLANT
SUT VIC AB 3-0 X1 27 (SUTURE) ×9 IMPLANT
SUTURE E-PAK OPEN HEART (SUTURE) ×3 IMPLANT
SYSTEM SAHARA CHEST DRAIN ATS (WOUND CARE) ×3 IMPLANT
TAPE CLOTH SURG 4X10 WHT LF (GAUZE/BANDAGES/DRESSINGS) ×3 IMPLANT
TAPE PAPER 2X10 WHT MICROPORE (GAUZE/BANDAGES/DRESSINGS) ×3 IMPLANT
TOWEL OR 17X24 6PK STRL BLUE (TOWEL DISPOSABLE) ×6 IMPLANT
TOWEL OR 17X26 10 PK STRL BLUE (TOWEL DISPOSABLE) ×6 IMPLANT
TRAY FOLEY IC TEMP SENS 16FR (CATHETERS) ×3 IMPLANT
TUBE FEEDING 8FR 16IN STR KANG (MISCELLANEOUS) ×3 IMPLANT
TUBING INSUFFLATION 10FT LAP (TUBING) ×3 IMPLANT
UNDERPAD 30X30 INCONTINENT (UNDERPADS AND DIAPERS) ×3 IMPLANT
WATER STERILE IRR 1000ML POUR (IV SOLUTION) ×6 IMPLANT

## 2013-08-26 NOTE — Anesthesia Preprocedure Evaluation (Addendum)
Anesthesia Evaluation  Patient identified by MRN, date of birth, ID band Patient awake    Reviewed: Allergy & Precautions, H&P , NPO status , Patient's Chart, lab work & pertinent test results  Airway Mallampati: II TM Distance: >3 FB Neck ROM: Full    Dental  (+) Teeth Intact, Dental Advisory Given   Pulmonary  breath sounds clear to auscultation        Cardiovascular hypertension, Pt. on home beta blockers + CAD and + Past MI Rhythm:Regular Rate:Normal     Neuro/Psych    GI/Hepatic   Endo/Other  diabetes  Renal/GU      Musculoskeletal   Abdominal   Peds  Hematology   Anesthesia Other Findings   Reproductive/Obstetrics                         Anesthesia Physical Anesthesia Plan  ASA: IV  Anesthesia Plan: General   Post-op Pain Management:    Induction: Intravenous  Airway Management Planned: Oral ETT  Additional Equipment: 3D TEE, Arterial line, PA Cath and Ultrasound Guidance Line Placement  Intra-op Plan:   Post-operative Plan: Post-operative intubation/ventilation  Informed Consent: I have reviewed the patients History and Physical, chart, labs and discussed the procedure including the risks, benefits and alternatives for the proposed anesthesia with the patient or authorized representative who has indicated his/her understanding and acceptance.   Dental advisory given  Plan Discussed with: CRNA and Anesthesiologist  Anesthesia Plan Comments:         Anesthesia Quick Evaluation

## 2013-08-26 NOTE — Anesthesia Postprocedure Evaluation (Signed)
  Anesthesia Post-op Note  Patient: Angel Benson  Procedure(s) Performed: Procedure(s) with comments: CORONARY ARTERY BYPASS GRAFTING (CABG) times five, using left internal mammary artery, right and left greater saphenous vein (N/A) - LIMA-LAD; SEQ SVG-OM1-OM2-OM3; SVG-RCA EVH RIGHT THIGH OVH RIGHT LOWER AND LEFT LOWER LEG INTRAOPERATIVE TRANSESOPHAGEAL ECHOCARDIOGRAM (N/A)  Patient Location: SICU  Anesthesia Type:General  Level of Consciousness: sedated and Patient remains intubated per anesthesia plan  Airway and Oxygen Therapy: Patient remains intubated per anesthesia plan and Patient placed on Ventilator (see vital sign flow sheet for setting)  Post-op Pain: none  Post-op Assessment: Post-op Vital signs reviewed, Patient's Cardiovascular Status Stable, Respiratory Function Stable, Patent Airway and No signs of Nausea or vomiting  Post-op Vital Signs: stable  Last Vitals:  Filed Vitals:   08/26/13 2030  BP:   Pulse: 90  Temp: 37.4 C  Resp: 12    Complications: No apparent anesthesia complications

## 2013-08-26 NOTE — Progress Notes (Signed)
Patient ID: Angel Benson, male   DOB: 22-Jul-1950, 63 y.o.   MRN: [id number] EVENING ROUNDS NOTE :     [address]       [place] [address]             [phone]                 Day of Surgery Procedure(s) (LRB): CORONARY ARTERY BYPASS GRAFTING (CABG) times five, using left internal mammary artery, right and left greater saphenous vein (N/A) INTRAOPERATIVE TRANSESOPHAGEAL ECHOCARDIOGRAM (N/A)  Total Length of Stay:  LOS: 3 days  BP 98/62  Pulse 87  Temp(Src) 99 F (37.2 C) (Oral)  Resp 15  Ht 5\' 9"  (1.753 m)  Wt 183 lb 6.8 oz (83.2 kg)  BMI 27.07 kg/m2  SpO2 100%  .Intake/Output     08/12 0701 - 08/13 0700   P.O.    I.V. (mL/kg) 5265.3 (63.3)   Blood 623   IV Piggyback 1350   Total Intake(mL/kg) 7238.3 (87)   Urine (mL/kg/hr) 4035 (3.8)   Blood 1200 (1.1)   Chest Tube 112 (0.1)   Total Output 5347   Net +1891.3         . sodium chloride 20 mL/hr at 08/26/13 1900  . sodium chloride 20 mL/hr at 08/26/13 1900  . [START ON 08/27/2013] sodium chloride    . dexmedetomidine Stopped (08/26/13 1745)  . insulin (NOVOLIN-R) infusion 2 Units/hr (08/26/13 1900)  . lactated ringers 20 mL/hr at 08/26/13 1900  . nitroGLYCERIN Stopped (08/26/13 1545)  . phenylephrine (NEO-SYNEPHRINE) Adult infusion 10 mcg/min (08/26/13 1900)     Lab Results  Component Value Date   WBC 8.6 08/26/2013   HGB 11.0* 08/26/2013   HCT 32.6* 08/26/2013   PLT 82* 08/26/2013   GLUCOSE 127* 08/26/2013   CHOL 208* 08/24/2013   TRIG 73 08/24/2013   HDL 41 08/24/2013   LDLCALC 152* 08/24/2013   NA 141 08/26/2013   K 3.5* 08/26/2013   CL 101 08/26/2013   CREATININE 0.90 08/26/2013   BUN 8 08/26/2013   CO2 22 08/26/2013   INR 1.42 08/26/2013   HGBA1C 7.4* 08/24/2013   Stable early post op not bleeding Waking up, weaning vent  Grace Isaac MD  Beeper [phone] Office [phone] 08/26/2013 7:43 PM

## 2013-08-26 NOTE — Progress Notes (Signed)
  Echocardiogram Echocardiogram Transesophageal has been performed.  Angel Benson FRANCES 08/26/2013, 9:46 AM

## 2013-08-26 NOTE — Anesthesia Procedure Notes (Addendum)
Anesthesia Procedure Note PA catheter:  Routine monitors. Timeout, sterile prep, drape, FBP R neck.  Trendelenburg position.  1% Lido local, finder and trocar RIJ 1st pass with US guidance.  Cordis placed over J wire. PA catheter in easily.  Sterile dressing applied.  Patient tolerated well, VSS.  Jenita Seashore, MD [phone] Procedure Name: Intubation Date/Time: 08/26/2013 9:00 AM Performed by: Antonietta Breach Pre-anesthesia Checklist: Patient identified, Timeout performed, Emergency Drugs available, Suction available and Patient being monitored Patient Re-evaluated:Patient Re-evaluated prior to inductionOxygen Delivery Method: Circle system utilized Preoxygenation: Pre-oxygenation with 100% oxygen Intubation Type: IV induction and Cricoid Pressure applied Ventilation: Mask ventilation without difficulty and Oral airway inserted - appropriate to patient size Laryngoscope Size: Mac and 3 Grade View: Grade I Tube type: Oral Number of attempts: 1 Airway Equipment and Method: Stylet and Oral airway Placement Confirmation: ETT inserted through vocal cords under direct vision,  breath sounds checked- equal and bilateral and positive ETCO2 Secured at: 23 cm Tube secured with: Tape Dental Injury: Teeth and Oropharynx as per pre-operative assessment

## 2013-08-26 NOTE — Brief Op Note (Addendum)
      [address]       [place] [address]             [phone]     08/23/2013 - 08/26/2013  1:15 PM  PATIENT:  Angel Benson  63 y.o. male  PRE-OPERATIVE DIAGNOSIS:  CAD with nonstemi   POST-OPERATIVE DIAGNOSIS:  Same   PROCEDURE:  Procedure(s): CORONARY ARTERY BYPASS GRAFTING (CABG)X5  LIMA-LAD; SEQ SVG-OM1-OM2-OM3; SVG-RCA INTRAOPERATIVE TRANSESOPHAGEAL ECHOCARDIOGRAM EVH RIGHT THIGH OVH RIGHT LOWER AND LEFT LOWER LEG  SURGEON:  Surgeon(s): Grace Isaac, MD  PHYSICIAN ASSISTANT: WAYNE GOLD PA-C  ANESTHESIA:   general  PATIENT CONDITION:  ICU - intubated and hemodynamically stable.  PRE-OPERATIVE WEIGHT: 47ML  COMPLICATIONS: NO KNOWN  Left pleural tube, one mt tube, s/g and a line and foley

## 2013-08-26 NOTE — Transfer of Care (Signed)
Immediate Anesthesia Transfer of Care Note  Patient: Angel Benson  Procedure(s) Performed: Procedure(s) with comments: CORONARY ARTERY BYPASS GRAFTING (CABG) times five, using left internal mammary artery, right and left greater saphenous vein (N/A) - LIMA-LAD; SEQ SVG-OM1-OM2-OM3; SVG-RCA EVH RIGHT THIGH OVH RIGHT LOWER AND LEFT LOWER LEG INTRAOPERATIVE TRANSESOPHAGEAL ECHOCARDIOGRAM (N/A)  Patient Location: SICU  Anesthesia Type:General  Level of Consciousness: Patient remains intubated per anesthesia plan  Airway & Oxygen Therapy: Patient remains intubated per anesthesia plan and Patient placed on Ventilator (see vital sign flow sheet for setting)  Post-op Assessment: Post -op Vital signs reviewed and stable  Post vital signs: Reviewed and stable  Complications: No apparent anesthesia complications

## 2013-08-26 NOTE — CV Procedure (Signed)
Intra-operative Transesophageal Echocardiography Report:  Mr. Angel Benson is a 63 year old male admitted on  08/24/2013 with chest pain. He had been having approximately 2 months of intermittant chest pain with associated shortness of breath. He ruled in for a non-stemi and cardiac catheterization revealed a totally occluded distal RCA, severe proximal LAD stenosis, distal LAD stenosis, and high grade disease in all 3 obtuse marginal branches and the circumflex. He is now scheduled to undergo coronary artery bypass grafting by Dr. Servando Snare. Intraoperative transesophageal echocardiography was indicated to evaluate the right and left ventricular function, assess for any valvular pathology, and to serve as a monitor for intraoperative volume status and intracardiac air.   The patient was brought to the operating room at [facility] and general anesthesia was induced without difficulty. Following endotracheal intubation and orogastric suctioning, the transesophageal echocardiography probe was inserted into the esophagus without difficulty.  Impression: Pre-bypass findings:   1. Aortic valve: The aortic valve was trileaflet. The leaflets opened normally without restriction. There was no aortic insufficiency. There was mild thickening of the leaflets.  2. The mitral valve: The mitral leaflets open normally without restriction. There was normal leaflet coaptation without prolapse or fluttering segments. There was trace mitral insufficiency.   3. Left ventricle: There is normal appearing left ventricular function. Left ventricular diastolic diameter 43 mm. Left ventricular wall thickness measured 0.95 in the inferior and 1.1 cm in the anterior and diastolic diastole transgastric short axis view. There were no regional wall motion abnormalities and ejection fraction was estimated at 55-60%.  4.  Right ventricle: The right ventricular cavity was of normal size. There was normal contractility of the  right ventricular free wall and normal tricuspid annular plane systolic excursion.  5. Tricuspid valve: The tricuspid valve appeared structurally normal with trace tricuspid insufficiency   6. Interatrial septum: Interatrial septum was intact without evidence of patent foramen ovale or atrial septal defect by color Doppler and bubble study.  7. Left atrium: There was no thrombus noted in the left atrium or left atrial appendage.  8. Ascending aorta: There was a well-defined aortic root and sinotubular ridge without effacement or or dilatation. There was mild thickening of the walls  but no protruding atheromata noted  . 8. Descending aorta: The  descending aorta was of normal diameter and measured 2.6 cm. There was scattered mild atheromatous disease noted within the walls of the descending aorta:  Post--bypass Findings:  1. Aortic valve: The aortic valve appeared unchanged from the pre-bypass study. The leaflets opened normally and there was no aortic insufficiency.  2. Mitral valve: The mitral valve leaflets open normally without restriction there was trace mitral insufficiency and no prolapsing or flail segments noted.   3. Left ventricle: The left ventricular cavity was of normal size and normal contractility in all segments interrogated and ejection fraction was estimated at 55-60%.  4. Right ventricle: The right ventricular cavity was of normal size was normal contractility the right ventricular free wall.  5. Tricuspid valve: There was trace tricuspid insufficiency.  Roberts Gaudy, M.D.

## 2013-08-27 ENCOUNTER — Encounter (HOSPITAL_COMMUNITY): Payer: Self-pay | Admitting: Cardiothoracic Surgery

## 2013-08-27 ENCOUNTER — Inpatient Hospital Stay (HOSPITAL_COMMUNITY): Payer: 59

## 2013-08-27 DIAGNOSIS — Z951 Presence of aortocoronary bypass graft: Secondary | ICD-10-CM

## 2013-08-27 LAB — CBC
HCT: 32.2 % — ABNORMAL LOW (ref 39.0–52.0)
HCT: 30.4 % — ABNORMAL LOW (ref 39.0–52.0)
HCT: 28.7 % — ABNORMAL LOW (ref 39.0–52.0)
HEMOGLOBIN: 10.3 g/dL — AB (ref 13.0–17.0)
Hemoglobin: 11.2 g/dL — ABNORMAL LOW (ref 13.0–17.0)
Hemoglobin: 9.8 g/dL — ABNORMAL LOW (ref 13.0–17.0)
MCH: 28.9 pg (ref 26.0–34.0)
MCH: 28.1 pg (ref 26.0–34.0)
MCH: 28.7 pg (ref 26.0–34.0)
MCHC: 34.8 g/dL (ref 30.0–36.0)
MCHC: 33.9 g/dL (ref 30.0–36.0)
MCHC: 34.1 g/dL (ref 30.0–36.0)
MCV: 83.2 fL (ref 78.0–100.0)
MCV: 82.8 fL (ref 78.0–100.0)
MCV: 83.9 fL (ref 78.0–100.0)
PLATELETS: 98 10*3/uL — AB (ref 150–400)
Platelets: 99 10*3/uL — ABNORMAL LOW (ref 150–400)
Platelets: 87 10*3/uL — ABNORMAL LOW (ref 150–400)
RBC: 3.87 MIL/uL — ABNORMAL LOW (ref 4.22–5.81)
RBC: 3.67 MIL/uL — AB (ref 4.22–5.81)
RBC: 3.42 MIL/uL — AB (ref 4.22–5.81)
RDW: 13.1 % (ref 11.5–15.5)
RDW: 13.1 % (ref 11.5–15.5)
RDW: 13.5 % (ref 11.5–15.5)
WBC: 10.8 10*3/uL — ABNORMAL HIGH (ref 4.0–10.5)
WBC: 9.6 10*3/uL (ref 4.0–10.5)
WBC: 11.9 10*3/uL — ABNORMAL HIGH (ref 4.0–10.5)

## 2013-08-27 LAB — BASIC METABOLIC PANEL
Anion gap: 12 (ref 5–15)
BUN: 8 mg/dL (ref 6–23)
CHLORIDE: 108 meq/L (ref 96–112)
CO2: 21 meq/L (ref 19–32)
Calcium: 7.7 mg/dL — ABNORMAL LOW (ref 8.4–10.5)
Creatinine, Ser: 0.94 mg/dL (ref 0.50–1.35)
GFR calc Af Amer: >90 mL/min (ref >90)
GFR calc non Af Amer: 88 mL/min — ABNORMAL LOW (ref >90)
GLUCOSE: 103 mg/dL — AB (ref 70–99)
POTASSIUM: 4.2 meq/L (ref 3.7–5.3)
SODIUM: 141 meq/L (ref 137–147)

## 2013-08-27 LAB — POCT I-STAT 3, ART BLOOD GAS (G3+)
ACID-BASE DEFICIT: 2 mmol/L (ref 0.0–2.0)
Acid-base deficit: 1 mmol/L (ref 0.0–2.0)
Bicarbonate: 22.5 mEq/L (ref 20.0–24.0)
Bicarbonate: 23.3 mEq/L (ref 20.0–24.0)
O2 SAT: 99 %
O2 SAT: 99 %
PCO2 ART: 35 mmHg (ref 35.0–45.0)
PCO2 ART: 38.5 mmHg (ref 35.0–45.0)
TCO2: 24 mmol/L (ref 0–100)
TCO2: 24 mmol/L (ref 0–100)
pH, Arterial: 7.377 (ref 7.350–7.450)
pH, Arterial: 7.433 (ref 7.350–7.450)
pO2, Arterial: 135 mmHg — ABNORMAL HIGH (ref 80.0–100.0)
pO2, Arterial: 148 mmHg — ABNORMAL HIGH (ref 80.0–100.0)

## 2013-08-27 LAB — POCT I-STAT, CHEM 8
BUN: 8 mg/dL (ref 6–23)
CALCIUM ION: 1.16 mmol/L (ref 1.13–1.30)
CHLORIDE: 103 meq/L (ref 96–112)
Creatinine, Ser: 1.3 mg/dL (ref 0.50–1.35)
GLUCOSE: 145 mg/dL — AB (ref 70–99)
HCT: 30 % — ABNORMAL LOW (ref 39.0–52.0)
Hemoglobin: 10.2 g/dL — ABNORMAL LOW (ref 13.0–17.0)
Potassium: 4.1 mEq/L (ref 3.7–5.3)
Sodium: 138 mEq/L (ref 137–147)
TCO2: 23 mmol/L (ref 0–100)

## 2013-08-27 LAB — GLUCOSE, CAPILLARY
Glucose-Capillary: 186 mg/dL — ABNORMAL HIGH (ref 70–99)
Glucose-Capillary: 105 mg/dL — ABNORMAL HIGH (ref 70–99)

## 2013-08-27 LAB — CREATININE, SERUM
CREATININE: 1.16 mg/dL (ref 0.50–1.35)
GFR calc Af Amer: 77 mL/min — ABNORMAL LOW (ref >90)
GFR calc non Af Amer: 66 mL/min — ABNORMAL LOW (ref >90)

## 2013-08-27 LAB — MAGNESIUM
MAGNESIUM: 2.5 mg/dL (ref 1.5–2.5)
Magnesium: 2 mg/dL (ref 1.5–2.5)

## 2013-08-27 MED ORDER — INSULIN DETEMIR 100 UNIT/ML ~~LOC~~ SOLN
10.0000 [IU] | Freq: Once | SUBCUTANEOUS | Status: AC
  Administered 2013-08-27: 10 [IU] via SUBCUTANEOUS
  Filled 2013-08-27: qty 0.1

## 2013-08-27 MED ORDER — ALBUMIN HUMAN 5 % IV SOLN
INTRAVENOUS | Status: AC
  Administered 2013-08-27: 25 g
  Filled 2013-08-27: qty 250

## 2013-08-27 MED ORDER — ALBUMIN HUMAN 5 % IV SOLN
12.5000 g | Freq: Once | INTRAVENOUS | Status: AC
  Administered 2013-08-27: 12.5 g via INTRAVENOUS
  Filled 2013-08-27: qty 250

## 2013-08-27 MED ORDER — INSULIN DETEMIR 100 UNIT/ML ~~LOC~~ SOLN
10.0000 [IU] | Freq: Every day | SUBCUTANEOUS | Status: DC
  Administered 2013-08-28: 10 [IU] via SUBCUTANEOUS
  Filled 2013-08-27 (×2): qty 0.1

## 2013-08-27 MED ORDER — INSULIN ASPART 100 UNIT/ML ~~LOC~~ SOLN
0.0000 [IU] | SUBCUTANEOUS | Status: DC
  Administered 2013-08-27: 2 [IU] via SUBCUTANEOUS
  Administered 2013-08-27: 20:00:00 via SUBCUTANEOUS
  Administered 2013-08-27: 2 [IU] via SUBCUTANEOUS
  Administered 2013-08-28 (×3): 4 [IU] via SUBCUTANEOUS
  Administered 2013-08-28 – 2013-08-29 (×4): 2 [IU] via SUBCUTANEOUS

## 2013-08-27 MED ORDER — ALBUMIN HUMAN 5 % IV SOLN
250.0000 mL | INTRAVENOUS | Status: AC | PRN
  Administered 2013-08-27: 250 mL via INTRAVENOUS
  Filled 2013-08-27: qty 250

## 2013-08-27 MED ORDER — ALBUMIN HUMAN 5 % IV SOLN
12.5000 g | Freq: Once | INTRAVENOUS | Status: AC
Start: 2013-08-27 — End: 2013-08-27
  Administered 2013-08-27: 12.5 g via INTRAVENOUS
  Filled 2013-08-27: qty 250

## 2013-08-27 MED FILL — Sodium Bicarbonate IV Soln 8.4%: INTRAVENOUS | Qty: 50 | Status: AC

## 2013-08-27 MED FILL — Lidocaine HCl IV Inj 20 MG/ML: INTRAVENOUS | Qty: 5 | Status: AC

## 2013-08-27 MED FILL — Mannitol IV Soln 20%: INTRAVENOUS | Qty: 500 | Status: AC

## 2013-08-27 MED FILL — Heparin Sodium (Porcine) Inj 1000 Unit/ML: INTRAMUSCULAR | Qty: 60 | Status: AC

## 2013-08-27 MED FILL — Electrolyte-R (PH 7.4) Solution: INTRAVENOUS | Qty: 4000 | Status: AC

## 2013-08-27 MED FILL — Sodium Chloride IV Soln 0.9%: INTRAVENOUS | Qty: 2000 | Status: AC

## 2013-08-27 NOTE — Progress Notes (Signed)
CT surgery p.m. Rounds  Patient up in chair sinus rhythm 90 permanent Low SVR-patient receiving albumin, neo-at 50 mcg per minute P.m. labs reviewed hematocrit 28% Urine output adequate

## 2013-08-27 NOTE — Progress Notes (Signed)
Patient ID: Angel Benson, male   DOB: 11-11-1950, 63 y.o.   MRN: [id number] TCTS DAILY ICU PROGRESS NOTE                   [address]            [place] [address]          [phone]   1 Day Post-Op Procedure(s) (LRB): CORONARY ARTERY BYPASS GRAFTING (CABG) times five, using left internal mammary artery, right and left greater saphenous vein (N/A) INTRAOPERATIVE TRANSESOPHAGEAL ECHOCARDIOGRAM (N/A)  Total Length of Stay:  LOS: 4 days   Subjective: Awake and alert neuro intact   Objective: Vital signs in last 24 hours: Temp:  [96.1 F (35.6 C)-99.9 F (37.7 C)] 99.5 F (37.5 C) (08/13 0730) Pulse Rate:  [52-94] 90 (08/13 0730) Cardiac Rhythm:  [-] Atrial paced (08/13 0730) Resp:  [0-31] 28 (08/13 0730) BP: (85-113)/(48-66) 92/62 mmHg (08/13 0730) SpO2:  [100 %] 100 % (08/13 0730) Arterial Line BP: (88-282)/(0-68) 136/51 mmHg (08/13 0715) FiO2 (%):  [40 %-50 %] 40 % (08/13 0020) Weight:  [192 lb 10.9 oz (87.4 kg)] 192 lb 10.9 oz (87.4 kg) (08/13 0400)  Filed Weights   08/24/13 0344 08/26/13 0603 08/27/13 0400  Weight: 186 lb 3.2 oz (84.46 kg) 183 lb 6.8 oz (83.2 kg) 192 lb 10.9 oz (87.4 kg)    Weight change: 9 lb 4.2 oz (4.2 kg)   Hemodynamic parameters for last 24 hours: PAP: (18-40)/(11-19) 35/18 mmHg CO:  [3.6 L/min-5.7 L/min] 5.7 L/min CI:  [1.8 L/min/m2-2.9 L/min/m2] 2.9 L/min/m2  Intake/Output from previous day: 08/12 0701 - 08/13 0700 In: 8940.2 [I.V.:6117.2; Blood:623; IV Piggyback:2200] Out: 2585 [Urine:6250; Blood:1200; Chest Tube:340]  Intake/Output this shift:    Current Meds: Scheduled Meds: . acetaminophen  1,000 mg Oral 4 times per day   Or  . acetaminophen (TYLENOL) oral liquid 160 mg/5 mL  1,000 mg Per Tube 4 times per day  . aspirin EC  325 mg Oral Daily   Or  . aspirin  324 mg Per Tube Daily  . atorvastatin  40 mg Oral q1800  . bisacodyl  10 mg Oral Daily   Or  . bisacodyl  10 mg Rectal Daily  . cefUROXime  (ZINACEF)  IV  1.5 g Intravenous Q12H  . docusate sodium  200 mg Oral Daily  . DOPamine  2-20 mcg/kg/min Intravenous To OR  . insulin regular  0-10 Units Intravenous TID WC  . metoprolol tartrate  12.5 mg Oral BID   Or  . metoprolol tartrate  12.5 mg Per Tube BID  . [START ON 08/28/2013] pantoprazole  40 mg Oral Daily  . sodium chloride  3 mL Intravenous Q12H   Continuous Infusions: . sodium chloride 20 mL/hr at 08/27/13 0400  . sodium chloride 20 mL/hr at 08/27/13 0400  . sodium chloride    . dexmedetomidine Stopped (08/26/13 1745)  . insulin (NOVOLIN-R) infusion 1.7 Units/hr (08/27/13 0700)  . lactated ringers 20 mL/hr at 08/27/13 0400  . nitroGLYCERIN Stopped (08/26/13 1545)  . phenylephrine (NEO-SYNEPHRINE) Adult infusion Stopped (08/27/13 0640)   PRN Meds:.metoprolol, midazolam, morphine injection, ondansetron (ZOFRAN) IV, oxyCODONE, sodium chloride  General appearance: alert, cooperative and no distress Neurologic: intact Heart: regular rate and rhythm, S1, S2 normal, no murmur, click, rub or gallop Lungs: clear to auscultation bilaterally Abdomen: soft, non-tender; bowel sounds normal; no masses,  no organomegaly Extremities: extremities normal, atraumatic, no cyanosis or edema and Homans sign is negative,  no sign of DVT Wound: sternum intact  Lab Results: CBC: Recent Labs  08/26/13 2130 08/26/13 2131 08/27/13 0400  WBC 10.8*  --  9.6  HGB 11.2* 11.9* 10.3*  HCT 32.2* 35.0* 30.4*  PLT 99*  --  87*   BMET:  Recent Labs  08/26/13 0100  08/26/13 2131 08/27/13 0400  NA 140  < > 139 141  K 4.9  < > 4.4 4.2  CL 105  < > 105 108  CO2 22  --   --  21  GLUCOSE 131*  < > 156* 103*  BUN 14  < > 7 8  CREATININE 1.19  < > 1.00 0.94  CALCIUM 8.7  --   --  7.7*  < > = values in this interval not displayed.  PT/INR:  Recent Labs  08/26/13 1530  LABPROT 17.4*  INR 1.42   Radiology: Dg Chest Portable 1 View  08/26/2013   CLINICAL DATA:  Status post CABG  EXAM:  PORTABLE CHEST - 1 VIEW  COMPARISON:  PA and lateral chest x-ray of August 24, 2013  FINDINGS: The lungs are mildly hypoinflated. There is no focal infiltrate. There is no pleural effusion or pneumothorax. The cardiac silhouette is top-normal in size.  The left-sided chest tube tip overlies the posterior lateral aspect of the left fourth rib. The endotracheal tube tip lies approximately 4 cm above the crotch of the carina. The Swan-Ganz catheter tip lies in the right main pulmonary artery. The esophagogastric tube tip and proximal port project below the level of the GE junction.  IMPRESSION: There is mild bilateral hypoinflation. No acute postprocedure complication is demonstrated. The support tubes and lines are in appropriate position.   Electronically Signed   By: David  Martinique   On: 08/26/2013 15:56     Assessment/Plan: S/P Procedure(s) (LRB): CORONARY ARTERY BYPASS GRAFTING (CABG) times five, using left internal mammary artery, right and left greater saphenous vein (N/A) INTRAOPERATIVE TRANSESOPHAGEAL ECHOCARDIOGRAM (N/A) Mobilize Diuresis Diabetes control Continue foley due to strict I&O See progression orders Expected Acute  Blood - loss Anemia Thrombocytopenia - avoid heparin for now- continue scd's    Angel Benson B 08/27/2013 7:42 AM

## 2013-08-27 NOTE — Progress Notes (Signed)
Spoke with Dr. Servando Snare in ref. To pt's increasing reliance on neo-synephrine.  Pt is currently at 28mcg w/ dopamine at 52mcg.  Advised that CT output was WNL.  Pt's u/o has been WNL today, however, for a 24 hour period yesterday - 6.2L.  The pt did have 5 albumin last night.  Will pull 1700 labs early.  Dr. Servando Snare has ordered one 241ml albumin to be infused and a CVP.

## 2013-08-27 NOTE — Procedures (Signed)
Extubation Procedure Note  Patient Details:   Name: Angel Benson DOB: 1950-11-19 MRN: [id number]   Airway Documentation:     Evaluation  O2 sats: stable throughout Complications: No apparent complications Patient did tolerate procedure well. Bilateral Breath Sounds: Other (Comment) (coarse)   Yes  Patient was extubated to 4L [place].  VS are WNL ;pt able to vocalize name and DOB.  No stridor noted and positive cuff leak.  Pt VC 654 and NIF -30. RT will continue to monitor.   Carrington Clamp A 08/27/2013, 12:57 AM

## 2013-08-27 NOTE — Progress Notes (Signed)
Follow-up phone call to Dr. Servando Snare - advised him of chem 8 results.  CBC, creatinine and magnesium were sent to the lab and are pending.  Chem 8 showed little change from AM labs.  CVP was 5 and another albumin has been ordered.

## 2013-08-28 ENCOUNTER — Inpatient Hospital Stay (HOSPITAL_COMMUNITY): Payer: 59

## 2013-08-28 LAB — GLUCOSE, CAPILLARY
GLUCOSE-CAPILLARY: 97 mg/dL (ref 70–99)
GLUCOSE-CAPILLARY: 170 mg/dL — AB (ref 70–99)
GLUCOSE-CAPILLARY: 174 mg/dL — AB (ref 70–99)
GLUCOSE-CAPILLARY: 179 mg/dL — AB (ref 70–99)
Glucose-Capillary: 101 mg/dL — ABNORMAL HIGH (ref 70–99)
Glucose-Capillary: 104 mg/dL — ABNORMAL HIGH (ref 70–99)
Glucose-Capillary: 104 mg/dL — ABNORMAL HIGH (ref 70–99)
Glucose-Capillary: 105 mg/dL — ABNORMAL HIGH (ref 70–99)
Glucose-Capillary: 107 mg/dL — ABNORMAL HIGH (ref 70–99)
Glucose-Capillary: 108 mg/dL — ABNORMAL HIGH (ref 70–99)
Glucose-Capillary: 115 mg/dL — ABNORMAL HIGH (ref 70–99)
Glucose-Capillary: 122 mg/dL — ABNORMAL HIGH (ref 70–99)
Glucose-Capillary: 125 mg/dL — ABNORMAL HIGH (ref 70–99)
Glucose-Capillary: 144 mg/dL — ABNORMAL HIGH (ref 70–99)
Glucose-Capillary: 150 mg/dL — ABNORMAL HIGH (ref 70–99)
Glucose-Capillary: 157 mg/dL — ABNORMAL HIGH (ref 70–99)
Glucose-Capillary: 158 mg/dL — ABNORMAL HIGH (ref 70–99)
Glucose-Capillary: 85 mg/dL (ref 70–99)
Glucose-Capillary: 92 mg/dL (ref 70–99)

## 2013-08-28 LAB — BASIC METABOLIC PANEL
Anion gap: 12 (ref 5–15)
BUN: 11 mg/dL (ref 6–23)
CO2: 24 mEq/L (ref 19–32)
Calcium: 8.1 mg/dL — ABNORMAL LOW (ref 8.4–10.5)
Chloride: 103 mEq/L (ref 96–112)
Creatinine, Ser: 1.23 mg/dL (ref 0.50–1.35)
GFR calc Af Amer: 72 mL/min — ABNORMAL LOW (ref >90)
GFR calc non Af Amer: 62 mL/min — ABNORMAL LOW (ref >90)
Glucose, Bld: 149 mg/dL — ABNORMAL HIGH (ref 70–99)
Potassium: 4.5 mEq/L (ref 3.7–5.3)
Sodium: 139 mEq/L (ref 137–147)

## 2013-08-28 LAB — CBC
HCT: 27.1 % — ABNORMAL LOW (ref 39.0–52.0)
Hemoglobin: 9.1 g/dL — ABNORMAL LOW (ref 13.0–17.0)
MCH: 29 pg (ref 26.0–34.0)
MCHC: 33.6 g/dL (ref 30.0–36.0)
MCV: 86.3 fL (ref 78.0–100.0)
Platelets: 81 K/uL — ABNORMAL LOW (ref 150–400)
RBC: 3.14 MIL/uL — ABNORMAL LOW (ref 4.22–5.81)
RDW: 13.7 % (ref 11.5–15.5)
WBC: 10.3 K/uL (ref 4.0–10.5)

## 2013-08-28 LAB — POCT I-STAT 3, ART BLOOD GAS (G3+)
Bicarbonate: 23.8 mEq/L (ref 20.0–24.0)
O2 SAT: 100 %
PO2 ART: 201 mmHg — AB (ref 80.0–100.0)
Patient temperature: 36.5
TCO2: 25 mmol/L (ref 0–100)
pCO2 arterial: 34.6 mmHg — ABNORMAL LOW (ref 35.0–45.0)
pH, Arterial: 7.443 (ref 7.350–7.450)

## 2013-08-28 NOTE — Progress Notes (Signed)
Patient ID: Angel Benson, male   DOB: Jun 10, 1950, 63 y.o.   MRN: [id number] TCTS DAILY ICU PROGRESS NOTE                   [address]            [place] [address]          [phone]   2 Days Post-Op Procedure(s) (LRB): CORONARY ARTERY BYPASS GRAFTING (CABG) times five, using left internal mammary artery, right and left greater saphenous vein (N/A) INTRAOPERATIVE TRANSESOPHAGEAL ECHOCARDIOGRAM (N/A)  Total Length of Stay:  LOS: 5 days   Subjective: Awake and alert neuro intact , up to chair awake and alert, good pain control  Objective: Vital signs in last 24 hours: Temp:  [97.6 F (36.4 C)-99.5 F (37.5 C)] 98.7 F (37.1 C) (08/14 0402) Pulse Rate:  [77-108] 95 (08/14 0715) Cardiac Rhythm:  [-] Normal sinus rhythm (08/14 0600) Resp:  [6-37] 22 (08/14 0715) BP: (83-128)/(46-99) 111/60 mmHg (08/14 0700) SpO2:  [91 %-100 %] 100 % (08/14 0715) Arterial Line BP: (77-123)/(49-56) 77/51 mmHg (08/13 0900) Weight:  [194 lb 7.1 oz (88.2 kg)] 194 lb 7.1 oz (88.2 kg) (08/14 0630)  Filed Weights   08/26/13 0603 08/27/13 0400 08/28/13 0630  Weight: 183 lb 6.8 oz (83.2 kg) 192 lb 10.9 oz (87.4 kg) 194 lb 7.1 oz (88.2 kg)    Weight change: 1 lb 12.2 oz (0.8 kg)   Hemodynamic parameters for last 24 hours: PAP: (41-46)/(17-20) 46/19 mmHg CVP:  [5 mmHg-14 mmHg] 14 mmHg CO:  [5.9 L/min] 5.9 L/min CI:  [3 L/min/m2] 3 L/min/m2  Intake/Output from previous day: 08/13 0701 - 08/14 0700 In: 2477.2 [P.O.:480; I.V.:1147.2; IV Piggyback:850] Out: 1520 [Urine:1230; Chest Tube:290]  Intake/Output this shift:    Current Meds: Scheduled Meds: . acetaminophen  1,000 mg Oral 4 times per day   Or  . acetaminophen (TYLENOL) oral liquid 160 mg/5 mL  1,000 mg Per Tube 4 times per day  . aspirin EC  325 mg Oral Daily   Or  . aspirin  324 mg Per Tube Daily  . atorvastatin  40 mg Oral q1800  . bisacodyl  10 mg Oral Daily   Or  . bisacodyl  10 mg Rectal Daily  . docusate  sodium  200 mg Oral Daily  . insulin aspart  0-24 Units Subcutaneous 6 times per day  . insulin detemir  10 Units Subcutaneous Daily  . insulin regular  0-10 Units Intravenous TID WC  . metoprolol tartrate  12.5 mg Oral BID   Or  . metoprolol tartrate  12.5 mg Per Tube BID  . pantoprazole  40 mg Oral Daily  . sodium chloride  3 mL Intravenous Q12H   Continuous Infusions: . sodium chloride 20 mL/hr at 08/27/13 0400  . sodium chloride 20 mL/hr at 08/27/13 0400  . sodium chloride    . dexmedetomidine Stopped (08/26/13 1745)  . insulin (NOVOLIN-R) infusion Stopped (08/27/13 1400)  . lactated ringers 20 mL/hr at 08/27/13 0400  . nitroGLYCERIN Stopped (08/26/13 1545)  . phenylephrine (NEO-SYNEPHRINE) Adult infusion 55 mcg/min (08/28/13 0700)   PRN Meds:.metoprolol, midazolam, morphine injection, ondansetron (ZOFRAN) IV, oxyCODONE, sodium chloride  General appearance: alert, cooperative and no distress Neurologic: intact Heart: regular rate and rhythm, S1, S2 normal, no murmur, click, rub or gallop Lungs: clear to auscultation bilaterally Abdomen: soft, non-tender; bowel sounds normal; no masses,  no organomegaly Extremities: extremities normal, atraumatic, no cyanosis or edema and Homans  sign is negative, no sign of DVT Wound: sternum intact  Lab Results: CBC:  Recent Labs  08/27/13 1500 08/27/13 1552 08/28/13 0500  WBC 11.9*  --  10.3  HGB 9.8* 10.2* 9.1*  HCT 28.7* 30.0* 27.1*  PLT 98*  --  81*   BMET:  Recent Labs  08/27/13 0400  08/27/13 1552 08/28/13 0500  NA 141  --  138 139  K 4.2  --  4.1 4.5  CL 108  --  103 103  CO2 21  --   --  24  GLUCOSE 103*  --  145* 149*  BUN 8  --  8 11  CREATININE 0.94  < > 1.30 1.23  CALCIUM 7.7*  --   --  8.1*  < > = values in this interval not displayed.  PT/INR:   Recent Labs  08/26/13 1530  LABPROT 17.4*  INR 1.42   Radiology: Dg Chest Portable 1 View In Am  08/27/2013   CLINICAL DATA:  Status post CABG  EXAM:  PORTABLE CHEST - 1 VIEW  COMPARISON:  Portable chest x-ray of August 26, 2013 and preoperative PA and lateral chest x-ray of August 24, 2013.  FINDINGS: There has been interval extubation of the trachea and the esophagus. The lungs are mildly hypoinflated. The pulmonary interstitial markings are slightly more conspicuous bilaterally.The left-sided chest tube tip overlies the posterior interspace of the fifth and sixth ribs. There is no pneumothorax or significant pleural effusion. The right internal jugular Cordis sheath contains the Swan-Ganz catheter whose tip lies in the proximal right main pulmonary artery. The cardiac silhouette is normal in size. The pulmonary vascularity is mildly prominent centrally though stable.  IMPRESSION: Since extubation the lung volumes remain mildly decreased and there has developed increased interstitial density bilaterally consistent with mild edema. There is no significant pleural effusion and no pneumothorax.   Electronically Signed   By: David  Martinique   On: 08/27/2013 08:02   Dg Chest Portable 1 View  08/26/2013   CLINICAL DATA:  Status post CABG  EXAM: PORTABLE CHEST - 1 VIEW  COMPARISON:  PA and lateral chest x-ray of August 24, 2013  FINDINGS: The lungs are mildly hypoinflated. There is no focal infiltrate. There is no pleural effusion or pneumothorax. The cardiac silhouette is top-normal in size.  The left-sided chest tube tip overlies the posterior lateral aspect of the left fourth rib. The endotracheal tube tip lies approximately 4 cm above the crotch of the carina. The Swan-Ganz catheter tip lies in the right main pulmonary artery. The esophagogastric tube tip and proximal port project below the level of the GE junction.  IMPRESSION: There is mild bilateral hypoinflation. No acute postprocedure complication is demonstrated. The support tubes and lines are in appropriate position.   Electronically Signed   By: David  Martinique   On: 08/26/2013 15:56      Assessment/Plan: S/P Procedure(s) (LRB): CORONARY ARTERY BYPASS GRAFTING (CABG) times five, using left internal mammary artery, right and left greater saphenous vein (N/A) INTRAOPERATIVE TRANSESOPHAGEAL ECHOCARDIOGRAM (N/A) Mobilize Diuresis Diabetes control See progression orders Expected Acute  Blood - loss Anemia Thrombocytopenia - avoid heparin for now- continue scd's  Wean off neo as tolerated  Destyn Parfitt B 08/28/2013 7:41 AM

## 2013-08-28 NOTE — Progress Notes (Signed)
Up in chair  No complaints  BP 114/60  Pulse 111  Temp(Src) 99.9 F (37.7 C) (Oral)  Resp 33  Ht 5\' 9"  (1.753 m)  Wt 194 lb 7.1 oz (88.2 kg)  BMI 28.70 kg/m2  SpO2 94%  Off neo  Doing well

## 2013-08-29 ENCOUNTER — Inpatient Hospital Stay (HOSPITAL_COMMUNITY): Payer: 59

## 2013-08-29 LAB — GLUCOSE, CAPILLARY
GLUCOSE-CAPILLARY: 120 mg/dL — AB (ref 70–99)
GLUCOSE-CAPILLARY: 156 mg/dL — AB (ref 70–99)
Glucose-Capillary: 117 mg/dL — ABNORMAL HIGH (ref 70–99)
Glucose-Capillary: 139 mg/dL — ABNORMAL HIGH (ref 70–99)
Glucose-Capillary: 165 mg/dL — ABNORMAL HIGH (ref 70–99)
Glucose-Capillary: 247 mg/dL — ABNORMAL HIGH (ref 70–99)

## 2013-08-29 LAB — COMPREHENSIVE METABOLIC PANEL
ALT: 16 U/L (ref 0–53)
AST: 20 U/L (ref 0–37)
Albumin: 2.9 g/dL — ABNORMAL LOW (ref 3.5–5.2)
Alkaline Phosphatase: 49 U/L (ref 39–117)
Anion gap: 10 (ref 5–15)
BUN: 13 mg/dL (ref 6–23)
CO2: 25 mEq/L (ref 19–32)
Calcium: 8.3 mg/dL — ABNORMAL LOW (ref 8.4–10.5)
Chloride: 106 mEq/L (ref 96–112)
Creatinine, Ser: 1.19 mg/dL (ref 0.50–1.35)
GFR calc Af Amer: 74 mL/min — ABNORMAL LOW (ref >90)
GFR calc non Af Amer: 64 mL/min — ABNORMAL LOW (ref >90)
Glucose, Bld: 116 mg/dL — ABNORMAL HIGH (ref 70–99)
Potassium: 3.9 mEq/L (ref 3.7–5.3)
Sodium: 141 mEq/L (ref 137–147)
Total Bilirubin: 0.8 mg/dL (ref 0.3–1.2)
Total Protein: 5.5 g/dL — ABNORMAL LOW (ref 6.0–8.3)

## 2013-08-29 MED ORDER — METFORMIN HCL 500 MG PO TABS
500.0000 mg | ORAL_TABLET | Freq: Two times a day (BID) | ORAL | Status: DC
  Administered 2013-08-29 – 2013-08-31 (×5): 500 mg via ORAL
  Filled 2013-08-29 (×7): qty 1

## 2013-08-29 MED ORDER — MAGNESIUM HYDROXIDE 400 MG/5ML PO SUSP: 30.0000 mL | Freq: Every day | ORAL | Status: DC | PRN

## 2013-08-29 MED ORDER — GUAIFENESIN-DM 100-10 MG/5ML PO SYRP: 15.0000 mL | ORAL_SOLUTION | ORAL | Status: DC | PRN

## 2013-08-29 MED ORDER — METOPROLOL TARTRATE 25 MG/10 ML ORAL SUSPENSION
25.0000 mg | Freq: Two times a day (BID) | ORAL | Status: DC
  Filled 2013-08-29 (×4): qty 10

## 2013-08-29 MED ORDER — ALUM & MAG HYDROXIDE-SIMETH 200-200-20 MG/5ML PO SUSP: 15.0000 mL | ORAL | Status: DC | PRN

## 2013-08-29 MED ORDER — SODIUM CHLORIDE 0.9 % IV SOLN: 250.0000 mL | INTRAVENOUS | Status: DC | PRN

## 2013-08-29 MED ORDER — ALPRAZOLAM 0.25 MG PO TABS: 0.2500 mg | ORAL_TABLET | Freq: Four times a day (QID) | ORAL | Status: DC | PRN

## 2013-08-29 MED ORDER — SODIUM CHLORIDE 0.9 % IJ SOLN
3.0000 mL | Freq: Two times a day (BID) | INTRAMUSCULAR | Status: DC
  Administered 2013-08-29 – 2013-08-30 (×4): 3 mL via INTRAVENOUS

## 2013-08-29 MED ORDER — ZOLPIDEM TARTRATE 5 MG PO TABS: 5.0000 mg | ORAL_TABLET | Freq: Every evening | ORAL | Status: DC | PRN

## 2013-08-29 MED ORDER — MOVING RIGHT ALONG BOOK
Freq: Once | Status: AC
  Administered 2013-08-29: 14:00:00
  Filled 2013-08-29: qty 1

## 2013-08-29 MED ORDER — METOPROLOL TARTRATE 25 MG PO TABS
25.0000 mg | ORAL_TABLET | Freq: Two times a day (BID) | ORAL | Status: DC
  Administered 2013-08-29 – 2013-08-31 (×5): 25 mg via ORAL
  Filled 2013-08-29 (×6): qty 1

## 2013-08-29 MED ORDER — INSULIN ASPART 100 UNIT/ML ~~LOC~~ SOLN
0.0000 [IU] | Freq: Three times a day (TID) | SUBCUTANEOUS | Status: DC
  Administered 2013-08-29: 3 [IU] via SUBCUTANEOUS
  Administered 2013-08-29: 5 [IU] via SUBCUTANEOUS
  Administered 2013-08-30 – 2013-08-31 (×4): 3 [IU] via SUBCUTANEOUS
  Administered 2013-08-31: 2 [IU] via SUBCUTANEOUS

## 2013-08-29 MED ORDER — SODIUM CHLORIDE 0.9 % IJ SOLN: 3.0000 mL | INTRAMUSCULAR | Status: DC | PRN

## 2013-08-29 NOTE — Addendum Note (Signed)
Addendum created 08/29/13 1627 by Roberts Gaudy, MD   Modules edited: Clinical Notes   Clinical Notes:  File: [id number]; Pend: [id number]

## 2013-08-29 NOTE — Progress Notes (Signed)
CARDIAC REHAB PHASE I   PRE:  Rate/Rhythm: 98 SR  BP:  Sitting: 127/71     SaO2: 97 RA  MODE:  Ambulation: 550 ft   POST:  Rate/Rhythm: 105 ST  BP:  Sitting: 128/67    SaO2: 94 RA  Pt walked 550 ft with no assist and RW.  Pt did not need RW, but he stated he felt more comfortable at the time if he used it with this walk.  Pt had no c/o during ambulation.  Reviewed IS and CRP II with pt.  Pt stated he is unsure of CRP II at this time, but a brochure was left for him to review.  [id number]  Lillia Dallas MS, ACSM RCEP 1:43 PM 08/29/2013

## 2013-08-29 NOTE — Progress Notes (Signed)
3 Days Post-Op Procedure(s) (LRB): CORONARY ARTERY BYPASS GRAFTING (CABG) times five, using left internal mammary artery, right and left greater saphenous vein (N/A) INTRAOPERATIVE TRANSESOPHAGEAL ECHOCARDIOGRAM (N/A) Subjective: No complaints this AM Ambulated 2 laps around unit this AM  Objective: Vital signs in last 24 hours: Temp:  [98 F (36.7 C)-99.9 F (37.7 C)] 98.1 F (36.7 C) (08/15 0752) Pulse Rate:  [39-111] 94 (08/15 0700) Cardiac Rhythm:  [-] Normal sinus rhythm (08/15 0400) Resp:  [11-36] 30 (08/15 0700) BP: (87-136)/(53-89) 114/57 mmHg (08/15 0700) SpO2:  [93 %-100 %] 97 % (08/15 0700) Weight:  [189 lb 13.1 oz (86.1 kg)] 189 lb 13.1 oz (86.1 kg) (08/15 0500)  Hemodynamic parameters for last 24 hours: CVP:  [9 mmHg] 9 mmHg  Intake/Output from previous day: 08/14 0701 - 08/15 0700 In: 838 [P.O.:600; I.V.:238] Out: 2155 [Urine:2025; Chest Tube:130] Intake/Output this shift:    General appearance: alert and no distress Neurologic: intact Heart: regular rate and rhythm Lungs: diminished breath sounds bibasilar Abdomen: normal findings: soft, non-tender  Lab Results:  Recent Labs  08/27/13 1500 08/27/13 1552 08/28/13 0500  WBC 11.9*  --  10.3  HGB 9.8* 10.2* 9.1*  HCT 28.7* 30.0* 27.1*  PLT 98*  --  81*   BMET:  Recent Labs  08/28/13 0500 08/29/13 0436  NA 139 141  K 4.5 3.9  CL 103 106  CO2 24 25  GLUCOSE 149* 116*  BUN 11 13  CREATININE 1.23 1.19  CALCIUM 8.1* 8.3*    PT/INR:  Recent Labs  08/26/13 1530  LABPROT 17.4*  INR 1.42   ABG    Component Value Date/Time   PHART 7.377 08/27/2013 0157   HCO3 22.5 08/27/2013 0157   TCO2 23 08/27/2013 1552   ACIDBASEDEF 2.0 08/27/2013 0157   O2SAT 99.0 08/27/2013 0157   CBG (last 3)   Recent Labs  08/29/13 0002 08/29/13 0438 08/29/13 0748  GLUCAP 139* 120* 117*    Assessment/Plan: S/P Procedure(s) (LRB): CORONARY ARTERY BYPASS GRAFTING (CABG) times five, using left internal mammary  artery, right and left greater saphenous vein (N/A) INTRAOPERATIVE TRANSESOPHAGEAL ECHOCARDIOGRAM (N/A) Plan for transfer to step-down: see transfer orders CV- POD # 2 CABg x 5  Doing well- lopressor, ASA, statin  RESP- continue IS  RENAL- creatinine and lytes OK  ENDO- CBG well controlled  Dc levemir, restart metformin, change to Ac/HS CBG  Transfer to 2000   LOS: 6 days    Angel Benson C 08/29/2013

## 2013-08-29 NOTE — Op Note (Signed)
NAMEHERSHAL, Angel Benson NO.:  [id number]  MEDICAL RECORD NO.:  [id number]  LOCATION:  2S05C                        FACILITY:  [facility]  PHYSICIAN:  Lanelle Bal, MD    DATE OF BIRTH:  1950-10-24  DATE OF PROCEDURE:  08/26/2013 DATE OF DISCHARGE:                              OPERATIVE REPORT   PREOPERATIVE DIAGNOSIS:  Severe three-vessel coronary artery disease with recent subendocardial myocardial infarction.  POSTOPERATIVE DIAGNOSIS:  Severe three-vessel coronary artery disease with recent subendocardial myocardial infarction.  SURGICAL PROCEDURE:  Coronary artery bypass grafting x5 with left internal mammary to the left anterior descending coronary artery, a sequential reverse saphenous vein graft to the first, second, and third obtuse marginal coronary artery, reverse saphenous vein graft to the posterior descending coronary artery with right thigh endo vein harvesting and open right lower leg vein harvest.  SURGEON:  Lanelle Bal, MD  FIRST ASSISTANT:  John Giovanni, PA.  BRIEF HISTORY:  The patient is a 63 year old male with strong family history of coronary occlusive disease who had new onset of anginal pain. He had an appointment for evaluation by Cardiology on September 21st; however, before his appointment, he began having prolonged episodes of chest pain and came to the emergency room with evidence of non ST- elevation myocardial infarction.  He underwent cardiac catheterization, which demonstrated severe three-vessel coronary artery disease with total occlusion of the right coronary artery, total occlusion of the LAD, and a high-grade greater than 90% sequential stenoses involving the circumflex, involving 3 moderate-size obtuse marginal branches. Overall, ventricular function was preserved.  Because of the patient's severe three-vessel disease and symptoms, coronary artery bypass grafting was recommended.  The patient agreed and signed  informed consent.  DESCRIPTION OF PROCEDURE:  With Swan-Ganz and arterial line monitors in place, the patient underwent general endotracheal anesthesia without incident.  Skin of the chest and legs were prepped with Betadine and draped in usual sterile manner.  TEE probe was placed by anesthesia and findings under separate note, but the patient had no significant valvular disease and overall preserved left ventricular function.  An appropriate time-out was performed.  We initially started with endo vein harvesting in the right thigh.  Segment of vein was harvested, however, a portion of this vein was suitable for the bypass to the posterior descending, but the remainder of the vein was small and not suitable. We made a small incision at the left knee to attempt left thigh endo vein harvesting; however, at this point, the vein was branching and was very small.  Incision was made at the left ankle, opened, and a short segment of vein was identified; however, this also branching, was not suitable.  Ultimately vein was harvested in open technique from the right lower leg.  This vein was of good quality and caliber and suitable for triple sequential graft to the obtuse marginal.  Median sternotomy was performed.  Left internal mammary artery was dissected down as pedicle graft.  The distal artery was divided, had good free flow. Pericardium was opened.  Overall ventricular function was preserved.  As noted on echo, the patient was systemically heparinized, ascending aorta was cannulated, the right atrium was cannulated, and aortic  root vent cardioplegia needle was introduced into the ascending aorta.  The patient was placed on cardiopulmonary bypass 2.4 L/min/m2.  Sites of anastomosis were selected and dissected out of the epicardium.  The patient's body temperature was cooled to 32 degrees.  Aortic crossclamp was applied and 600 mL of cold blood potassium cardioplegia was administered with  diastolic arrest of the heart.  Myocardial septal temperatures were monitored throughout the crossclamp.  Attention was turned first to the small posterior descending coronary artery, which was just over 1 mm in size.  Using a second reverse saphenous vein graft with a running 8-0 Prolene, distal anastomosis was performed.  The heart was then elevated.  The first, second, and third obtuse marginal vessels were identified and initially the middle second obtuse marginal was opened and admitted a 1.5 mm probe.  Using a diamond-tape side-to-side anastomosis with a running 8-0 Prolene, distal anastomosis was performed.  Approximately, on the vein, a second side-to-side anastomosis was performed between the vein and the first obtuse marginal, also with a running 8-0 Prolene.  Additional cold blood cardioplegia was administered down the vein grafts.  Then, the distal extent of the vein was trimmed to appropriate length.  The third obtuse marginal was opened, it was also approximately 1.4-1.5 mm in size. Using a running 8-0 Prolene, distal anastomosis was performed. Additional cold blood cardioplegia was administered down the vein grafts.  Attention was then turned to the left anterior descending coronary artery, which was totally occluded and was relatively small. The distal third of the vessel was opened and a 1-mm probe would pass distally and 1.5-mm probe passed proximally.  Using a running 8-0 Prolene, left internal mammary artery was anastomosed to left anterior descending coronary artery.  With release of the bulldog on the mammary artery, there was rise in myocardial septal temperature.  The bulldog was placed back on the mammary artery and with crossclamp still in place, 2 punch aortotomies were performed.  Each of the two vein grafts were anastomosed to the ascending aorta.  Air was evacuated from the grafts and partial occlusion clamp, and the aortic cross-clamp was removed with a  total crossclamp time of 105 minutes.  The patient spontaneously converted to a sinus rhythm.  Sites of anastomosis were inspected and free of bleeding.  He was then ventilated and weaned from cardiopulmonary bypass without difficulty.  He remained hemodynamically stable.  He was decannulated in usual fashion.  Protamine sulfate was administered with operative field hemostatic.  Atrial and ventricular pacing wires were applied.  The left pleural tube and Blake mediastinal drain were left in place.  The sternum was closed with #6 stainless steel wire.  Fascia closed with interrupted 0 Vicryl and 3-0 Vicryl for subcutaneous tissue, 4-0 subcuticular stitch on skin edges.  Dry dressings were applied.  Sponge and needle count was reported as correct at completion of procedure.  The patient tolerated the procedure without obvious complication and was transferred to the Surgical Intensive Care Unit for further postoperative care.  Total pump time was 135 minutes. The patient did not require any blood products during the operative procedure.  He was transferred to Surgical Intensive Care Unit for postoperative care.     Lanelle Bal, MD     EG/MEDQ  D:  08/28/2013  T:  08/28/2013  Job:  [id number]

## 2013-08-29 NOTE — Progress Notes (Signed)
Anesthesiology Follow-up:  Awake and alert, neuro intact, moved to 2W today, ambulating with assistance, minimal pain.  VS: T- 36.5 BP-130/64 HR-96 RR 20 O2 sat 98% on RA  K-3.9 BUN/Cr. 13/1.19 H/H- 9.1/27.1 Platelets 81,000  Extubated 9 hours post-op.  POD #3 CABG X 5, Doing well, no apparent complications  Roberts Gaudy

## 2013-08-30 ENCOUNTER — Inpatient Hospital Stay (HOSPITAL_COMMUNITY): Payer: 59

## 2013-08-30 LAB — BASIC METABOLIC PANEL
Anion gap: 11 (ref 5–15)
BUN: 17 mg/dL (ref 6–23)
CALCIUM: 8.6 mg/dL (ref 8.4–10.5)
CO2: 24 mEq/L (ref 19–32)
Chloride: 104 mEq/L (ref 96–112)
Creatinine, Ser: 1.16 mg/dL (ref 0.50–1.35)
GFR calc non Af Amer: 66 mL/min — ABNORMAL LOW (ref >90)
GFR, EST AFRICAN AMERICAN: 77 mL/min — AB (ref >90)
GLUCOSE: 176 mg/dL — AB (ref 70–99)
Potassium: 4 mEq/L (ref 3.7–5.3)
SODIUM: 139 meq/L (ref 137–147)

## 2013-08-30 LAB — GLUCOSE, CAPILLARY
GLUCOSE-CAPILLARY: 163 mg/dL — AB (ref 70–99)
GLUCOSE-CAPILLARY: 172 mg/dL — AB (ref 70–99)
Glucose-Capillary: 177 mg/dL — ABNORMAL HIGH (ref 70–99)
Glucose-Capillary: 200 mg/dL — ABNORMAL HIGH (ref 70–99)

## 2013-08-30 LAB — CBC
HCT: 27.9 % — ABNORMAL LOW (ref 39.0–52.0)
HEMOGLOBIN: 9.4 g/dL — AB (ref 13.0–17.0)
MCH: 28.6 pg (ref 26.0–34.0)
MCHC: 33.7 g/dL (ref 30.0–36.0)
MCV: 84.8 fL (ref 78.0–100.0)
Platelets: 138 10*3/uL — ABNORMAL LOW (ref 150–400)
RBC: 3.29 MIL/uL — ABNORMAL LOW (ref 4.22–5.81)
RDW: 13.5 % (ref 11.5–15.5)
WBC: 7.8 10*3/uL (ref 4.0–10.5)

## 2013-08-30 MED ORDER — LISINOPRIL 2.5 MG PO TABS
2.5000 mg | ORAL_TABLET | Freq: Every day | ORAL | Status: DC
  Administered 2013-08-30 – 2013-08-31 (×2): 2.5 mg via ORAL
  Filled 2013-08-30 (×2): qty 1

## 2013-08-30 NOTE — Discharge Instructions (Signed)
Coronary Artery Bypass Grafting, Care After °Refer to this sheet in the next few weeks. These instructions provide you with information on caring for yourself after your procedure. Your health care provider may also give you more specific instructions. Your treatment has been planned according to current medical practices, but problems sometimes occur. Call your health care provider if you have any problems or questions after your procedure. °WHAT TO EXPECT AFTER THE PROCEDURE °Recovery from surgery will be different for everyone. Some people feel well after 3 or 4 weeks, while for others it takes longer. After your procedure, it is typical to have the following: °· Nausea and a lack of appetite.   °· Constipation. °· Weakness and fatigue.   °· Depression or irritability.   °· Pain or discomfort at your incision site. °HOME CARE INSTRUCTIONS °· Take medicines only as directed by your health care provider. Do not stop taking medicines or start any new medicines without first checking with your health care provider. °· Take your pulse as directed by your health care provider. °· Perform deep breathing as directed by your health care provider. If you were given a device called an incentive spirometer, use it to practice deep breathing several times a day. Support your chest with a pillow or your arms when you take deep breaths or cough. °· Keep incision areas clean, dry, and protected. Remove or change any bandages (dressings) only as directed by your health care provider. You may have skin adhesive strips over the incision areas. Do not take the strips off. They will fall off on their own. °· Check incision areas daily for any swelling, redness, or drainage. °· If incisions were made in your legs, do the following: °¨ Avoid crossing your legs.   °¨ Avoid sitting for long periods of time. Change positions every 30 minutes.   °¨ Elevate your legs when you are sitting. °· Wear compression stockings as directed by your  health care provider. These stockings help keep blood clots from forming in your legs. °· Take showers once your health care provider approves. Until then, only take sponge baths. Pat incisions dry. Do not rub incisions with a washcloth or towel. Do not take baths, swim, or use a hot tub until your health care provider approves. °· Eat foods that are high in fiber, such as raw fruits and vegetables, whole grains, beans, and nuts. Meats should be lean cut. Avoid canned, processed, and fried foods. °· Drink enough fluid to keep your urine clear or pale yellow. °· Weigh yourself every day. This helps identify if you are retaining fluid that may make your heart and lungs work harder. °· Rest and limit activity as directed by your health care provider. You may be instructed to: °¨ Stop any activity at once if you have chest pain, shortness of breath, irregular heartbeats, or dizziness. Get help right away if you have any of these symptoms. °¨ Move around frequently for short periods or take short walks as directed by your health care provider. Increase your activities gradually. You may need physical therapy or cardiac rehabilitation to help strengthen your muscles and build your endurance. °¨ Avoid lifting, pushing, or pulling anything heavier than 10 lb (4.5 kg) for at least 6 weeks after surgery. °· Do not drive until your health care provider approves.  °· Ask your health care provider when you may return to work. °· Ask your health care provider when you may resume sexual activity. °· Keep all follow-up visits as directed by your health care   provider. This is important. °SEEK MEDICAL CARE IF: °· You have swelling, redness, increasing pain, or drainage at the site of an incision. °· You have a fever. °· You have swelling in your ankles or legs. °· You have pain in your legs.   °· You gain 2 or more pounds (0.9 kg) a day. °· You are nauseous or vomit. °· You have diarrhea.  °SEEK IMMEDIATE MEDICAL CARE IF: °· You have  chest pain that goes to your jaw or arms. °· You have shortness of breath.   °· You have a fast or irregular heartbeat.   °· You notice a "clicking" in your breastbone (sternum) when you move.   °· You have numbness or weakness in your arms or legs. °· You feel dizzy or light-headed.   °MAKE SURE YOU: °· Understand these instructions. °· Will watch your condition. °· Will get help right away if you are not doing well or get worse. °Document Released: 07/21/2004 Document Revised: 05/18/2013 Document Reviewed: 06/10/2012 °Exit[employer]® Patient Information ©2015 [employer]. This information is not intended to replace advice given to you by your health care provider. Make sure you discuss any questions you have with your health care provider. ° °Endoscopic Saphenous Vein Harvesting °Care After °Refer to this sheet in the next few weeks. These instructions provide you with information on caring for yourself after your procedure. Your health care provider may also give you more specific instructions. Your treatment has been planned according to current medical practices, but problems sometimes occur. Call your health care provider if you have any problems or questions after your procedure. °HOME CARE INSTRUCTIONS °Medicine °· Take whatever pain medicine your surgeon prescribes. Follow the directions carefully. Do not take over-the-counter pain medicine unless your surgeon says it is okay. Some pain medicine can cause bleeding problems for several weeks after surgery. °· Follow your surgeon's instructions about driving. You will probably not be permitted to drive after heart surgery. °· Take any medicines your surgeon prescribes. Any medicines you took before your heart surgery should be checked with your health care provider before you start taking them again. °Wound care °· If your surgeon has prescribed an elastic bandage or stocking, ask how long you should wear it. °· Check the area around your surgical cuts  (incisions) whenever your bandages (dressings) are changed. Look for any redness or swelling. °· You will need to return to have the stitches (sutures) or staples taken out. Ask your surgeon when to do that. °· Ask your surgeon when you can shower or bathe. °Activity °· Try to keep your legs raised when you are sitting. °· Do any exercises your health care providers have given you. These may include deep breathing exercises, coughing, walking, or other exercises. °SEEK MEDICAL CARE IF: °· You have any questions about your medicines. °· You have more leg pain, especially if your pain medicine stops working. °· New or growing bruises develop on your leg. °· Your leg swells, feels tight, or becomes red. °· You have numbness in your leg. °SEEK IMMEDIATE MEDICAL CARE IF: °· Your pain gets much worse. °· Blood or fluid leaks from any of the incisions. °· Your incisions become warm, swollen, or red. °· You have chest pain. °· You have trouble breathing. °· You have a fever. °· You have more pain near your leg incision. °MAKE SURE YOU: °· Understand these instructions. °· Will watch your condition. °· Will get help right away if you are not doing well or get worse. °Document Released: 09/13/2010   Document Revised: 01/06/2013 Document Reviewed: 09/13/2010 °ExitCare® Patient Information ©2015 [employer]. This information is not intended to replace advice given to you by your health care provider. Make sure you discuss any questions you have with your health care provider. ° ° °

## 2013-08-30 NOTE — Progress Notes (Signed)
Pt ambulated X's 2 today; standby assist; pt tolerated both walks well; ambulated 1300 ft each time he ambulated.  Rowe Pavy, RN

## 2013-08-30 NOTE — Discharge Summary (Signed)
Physician Discharge Summary  Patient ID: Angel Benson MRN: [id number] DOB/AGE: 1950/01/25 63 y.o.  Admit date: 08/23/2013 Discharge date: 08/30/2013  Admission Diagnoses:  Patient Active Problem List   Diagnosis Date Noted  . Hyperlipidemia 08/25/2013  . CAD (coronary artery disease), native coronary artery 08/25/2013  . Benign essential HTN 08/25/2013  . DM2 (diabetes mellitus, type 2) 08/25/2013  . NSTEMI (non-ST elevated myocardial infarction) 08/24/2013   Discharge Diagnoses:   Patient Active Problem List   Diagnosis Date Noted  . S/P CABG x 5 08/26/2013  . Hyperlipidemia 08/25/2013  . CAD (coronary artery disease), native coronary artery 08/25/2013  . Benign essential HTN 08/25/2013  . DM2 (diabetes mellitus, type 2) 08/25/2013  . NSTEMI (non-ST elevated myocardial infarction) 08/24/2013   Discharged Condition: good  History of Present Illness:  Angel Benson is a 63 yo African American male who presented to [facility] with complaints of chest pain.  He was recently evaluated at a walk in clinic with similar complaints at which time he was started on several medications including a PPI.  The patient states his chest discomfort has been occuring over the past 2 months.  The patient describes the pain as being substernal with a burning sensation associated with exertion.  This was also associated with shortness of breath.  However, episode tonight was prolonged about 45 minutes prompting presented to the ED.  Workup in the Emergency Department showed EKG changes with T wave inversion in the anterior leads and depression in the lateral leads.  Troponin level was elevated.  He was ruled in for NSTEMI and transferred to [facility] for further care.    Hospital Course:   Upon arrival patient was on a Heparin drip.  He remained chest pain free since admission to the ED.  Cardiology evaluated patient and felt he would benefit from cardiac catheterization.  This was  performed and revealed severe three vessel CAD with a decreased in EF.  It was felt he would benefit from coronary bypass procedure and TCTS consult was obtained.  He was evaluated by Dr. Servando Snare who was in agreement that the patient would benefit from Coronary Bypass procedure.  The risks and benefits of the procedure were explained to the patient and he was agreeable to proceed.  He was taken to the operating room and underwent CABG x 5 utilizing LIMA to LAD, SVG to RCA, and a Sequential SVG to OM1, OM2, and OM3.  He also underwent endoscopic saphenous vein harvest from his right thigh and open saphenous vein harvest from his right lower and left lower leg.  He tolerated the procedure without difficulty and was taken to the SICU in stable condition.  The patient was extubated the evening of surgery.  During his stay in the SICU the patient was weaned of Neo Synephrine as tolerated.  He was found to be thrombocytopenic and was kept off all Heparin agents.  His chest tubes and arterial lines were removed without difficulty.  He was weaned off his insulin drip and restarted on his home regimen of Metformin.  He was ambulating independently around the SICU and felt medically stable for transfer to the telemetry unit.    The patient continues to progress.  His blood sugars remain well controlled with use of Metformin.  He was maintaining NSR and his pacing wires were removed.  His blood pressure was slowly increasing up to 130's and he was started on low dose ACE inhibitor.  The patient is tolerating a  carb modified diet.  He continues to ambulate with minimal difficulty.  Should he remain stable overnight, we anticipate discharge home on 08/31/2013.          Significant Diagnostic Studies: angiography:   Hemodynamic Findings:  Central aortic pressure: 121/70  Left ventricular pressure: 117/2/4  Angiographic Findings:  Left main: Long vessel with diffuse 40-50% narrowing. There is dampening of the pressure  tracing with catheter engagement.  Left Anterior Descending Artery: Large caliber vessel that courses to the apex. The proximal vessel has a long segment with 99% sub-total occlusion. The mid vessel has diffuse plaque disease. The distal vessel is occluded and fills from right to left collaterals. There are two small to moderate caliber diagonal branches. These branches appear to have diffuse moderate stenosis.  Circumflex Artery: Large caliber vessel with three moderate to large caliber obtuse marginal branches. The proximal Circumflex has diffuse 30% stenosis. The mid vessel has diffuse 70% stenosis. The distal vessel has 99% stenosis. The first OM branch is a moderate to large caliber vessel with proximal 99% stenosis. The second OM branch is a moderate to large caliber vessel with 60% stenosis. The third OM branch is a moderate caliber vessel with proximal 99% stenosis. This vessel takes off after the severe mid AV groove vessel stenosis.  Right Coronary Artery: Moderate caliber co-dominant vessel with diffuse 90% stenosis throughout the entire mid vessel followed by 100% distal occlusion. The posterolateral branch and the PDA fill by right to right collaterals.  Left Ventricular Angiogram: LVEF=45-50% with inferior wall hypokinesis.   Treatments: surgery:   Coronary artery bypass grafting x5 with left internal mammary to the left anterior descending coronary artery, a  sequential reverse saphenous vein graft to the first, second, and third obtuse marginal coronary artery, reverse saphenous vein graft to the posterior descending coronary artery with right thigh endo vein harvesting and open right lower leg vein harvest  Disposition: Home  Discharge Medications:    Medication List    STOP taking these medications       simvastatin 20 MG tablet  Commonly known as:  ZOCOR      TAKE these medications       aspirin 325 MG EC tablet  Take 1 tablet (325 mg total) by mouth daily.      atorvastatin 40 MG tablet  Commonly known as:  LIPITOR  Take 1 tablet (40 mg total) by mouth daily at 6 PM.     lisinopril 2.5 MG tablet  Commonly known as:  PRINIVIL,ZESTRIL  Take 1 tablet (2.5 mg total) by mouth daily.     metFORMIN 500 MG tablet  Commonly known as:  GLUCOPHAGE  Take 1 tablet (500 mg total) by mouth 2 (two) times daily with a meal.     metoprolol succinate 50 MG 24 hr tablet  Commonly known as:  TOPROL-XL  Take 50 mg by mouth daily. Take with or immediately following a meal.     omeprazole 20 MG capsule  Commonly known as:  PRILOSEC  Take 20 mg by mouth daily.     oxyCODONE 5 MG immediate release tablet  Commonly known as:  Oxy IR/ROXICODONE  Take 1-2 tablets (5-10 mg total) by mouth every 4 (four) hours as needed for moderate pain.       The patient has been discharged on:   1.Beta Blocker:  Yes [ x  ]  No   [   ]                              If No, reason:  2.Ace Inhibitor/ARB: Yes [x   ]                                     No  [    ]                                     If No, reason:  3.Statin:   Yes [x   ]                  No  [   ]                  If No, reason:  4.Ecasa:  Yes  [x   ]                  No   [   ]                  If No, reason:    Follow-up Information   Follow up with Grace Isaac, MD.   Specialty:  Cardiothoracic Surgery   Contact information:   [place] [address] [phone]       Follow up with [facility].   Contact information:   [place]       Follow up with HILTY,Kenneth C, MD. (arrange for cardiology appt follow-up in 2 weeks)    Specialty:  Cardiology   Contact information:   [place] [address] [phone]       Signed: Ellwood Handler 08/30/2013, 11:33 AM

## 2013-08-30 NOTE — Progress Notes (Addendum)
      [address]       [place] [address]             [phone]      4 Days Post-Op Procedure(s) (LRB): CORONARY ARTERY BYPASS GRAFTING (CABG) times five, using left internal mammary artery, right and left greater saphenous vein (N/A) INTRAOPERATIVE TRANSESOPHAGEAL ECHOCARDIOGRAM (N/A)  Subjective:  Angel Benson complains of some incisional soreness.  He is otherwise doing well.  He is ambulating without difficulty.  + BM  Objective: Vital signs in last 24 hours: Temp:  [97.7 F (36.5 C)-98.5 F (36.9 C)] 98.5 F (36.9 C) (08/16 0547) Pulse Rate:  [96-100] 98 (08/16 0547) Cardiac Rhythm:  [-] Normal sinus rhythm;Sinus tachycardia (08/15 2322) Resp:  [20-25] 20 (08/16 0547) BP: (98-130)/(56-70) 114/70 mmHg (08/16 0547) SpO2:  [96 %-99 %] 99 % (08/16 0547) Weight:  [188 lb 7.9 oz (85.5 kg)] 188 lb 7.9 oz (85.5 kg) (08/16 0547)  Intake/Output from previous day: 08/15 0701 - 08/16 0700 In: 243 [P.O.:240; I.V.:3] Out: 350 [Urine:150; Stool:200]  General appearance: alert, cooperative and no distress Heart: regular rate and rhythm Lungs: clear to auscultation bilaterally Abdomen: soft, non-tender; bowel sounds normal; no masses,  no organomegaly Extremities: edema trace Wound: clean and dry  Lab Results:  Recent Labs  08/28/13 0500 08/30/13 0450  WBC 10.3 7.8  HGB 9.1* 9.4*  HCT 27.1* 27.9*  PLT 81* 138*   BMET:  Recent Labs  08/29/13 0436 08/30/13 0450  NA 141 139  K 3.9 4.0  CL 106 104  CO2 25 24  GLUCOSE 116* 176*  BUN 13 17  CREATININE 1.19 1.16  CALCIUM 8.3* 8.6    PT/INR: No results found for this basename: LABPROT, INR,  in the last 72 hours ABG    Component Value Date/Time   PHART 7.377 08/27/2013 0157   HCO3 22.5 08/27/2013 0157   TCO2 23 08/27/2013 1552   ACIDBASEDEF 2.0 08/27/2013 0157   O2SAT 99.0 08/27/2013 0157   CBG (last 3)   Recent Labs  08/29/13 1626 08/29/13 2127 08/30/13 0640  GLUCAP 247* 156* 163*     Assessment/Plan: S/P Procedure(s) (LRB): CORONARY ARTERY BYPASS GRAFTING (CABG) times five, using left internal mammary artery, right and left greater saphenous vein (N/A) INTRAOPERATIVE TRANSESOPHAGEAL ECHOCARDIOGRAM (N/A)  1. CV- NSR, pressure has been high as 130s- will continue Lopressor, add low dose Lisinopril if patient can tolerate 2. Pulm- no acute issues, off oxygen, CXR shows atelectasis encouraged use of IS 3. Renal- stable, weight is at baseline, not on lasix 4. DM- controlled, continue metformin 5. Dispo- patient doing well, will d/c EPW, likely home in AM   LOS: 7 days    [name], [name] 08/30/2013  Patient seen and examined, agree with above

## 2013-08-30 NOTE — Progress Notes (Addendum)
DC EPW per MD orders and protocol; wires intact; pt on bedrest for one hour; Q15 vitals; call bell within reach; will continue to monitor.  Rowe Pavy, RN

## 2013-08-31 LAB — GLUCOSE, CAPILLARY
GLUCOSE-CAPILLARY: 151 mg/dL — AB (ref 70–99)
Glucose-Capillary: 148 mg/dL — ABNORMAL HIGH (ref 70–99)

## 2013-08-31 MED ORDER — LISINOPRIL 2.5 MG PO TABS: 2.5000 mg | ORAL_TABLET | Freq: Every day | ORAL | Status: DC

## 2013-08-31 MED ORDER — METFORMIN HCL 500 MG PO TABS: 500.0000 mg | ORAL_TABLET | Freq: Two times a day (BID) | ORAL | Status: DC

## 2013-08-31 MED ORDER — OXYCODONE HCL 5 MG PO TABS: 5.0000 mg | ORAL_TABLET | ORAL | Status: DC | PRN

## 2013-08-31 MED ORDER — LIVING WELL WITH DIABETES BOOK
Freq: Once | Status: AC
Start: 2013-08-31 — End: 2013-08-31
  Administered 2013-08-31: 09:00:00
  Filled 2013-08-31: qty 1

## 2013-08-31 MED ORDER — ATORVASTATIN CALCIUM 40 MG PO TABS: 40.0000 mg | ORAL_TABLET | Freq: Every day | ORAL | Status: DC

## 2013-08-31 MED ORDER — ASPIRIN 325 MG PO TBEC: 325.0000 mg | DELAYED_RELEASE_TABLET | Freq: Every day | ORAL | Status: DC

## 2013-08-31 MED ORDER — BLOOD GLUCOSE METER KIT: PACK | Status: DC

## 2013-08-31 MED FILL — Potassium Chloride Inj 2 mEq/ML: INTRAVENOUS | Qty: 40 | Status: AC

## 2013-08-31 MED FILL — Magnesium Sulfate Inj 50%: INTRAMUSCULAR | Qty: 10 | Status: AC

## 2013-08-31 MED FILL — Heparin Sodium (Porcine) Inj 1000 Unit/ML: INTRAMUSCULAR | Qty: 30 | Status: AC

## 2013-08-31 NOTE — Progress Notes (Signed)
Removed CT sutures and applied benzoin and 1/2 " steri strips.  Pt tolerated procedure well. Pt given signs and symptoms of infection. Pt resting with call bell within reach.  Will continue to monitor. Duel Conrad McClintock, RN     

## 2013-08-31 NOTE — Progress Notes (Signed)
Pt/family given discharge instructions, medication lists, follow up appointments, and when to call the doctor.  Pt/family verbalizes understanding. Pt given signs and symptoms of infection. Pt given resources for follow up.  Pt will follow up with out patient diabetes. Payton Emerald, RN

## 2013-08-31 NOTE — Progress Notes (Signed)
[address]       [place] [address]             [phone]      5 Days Post-Op Procedure(s) (LRB): CORONARY ARTERY BYPASS GRAFTING (CABG) times five, using left internal mammary artery, right and left greater saphenous vein (N/A) INTRAOPERATIVE TRANSESOPHAGEAL ECHOCARDIOGRAM (N/A) Subjective: Feels well  Objective: Vital signs in last 24 hours: Temp:  [98.4 F (36.9 C)-98.8 F (37.1 C)] 98.8 F (37.1 C) (08/17 0414) Pulse Rate:  [87-96] 89 (08/17 0414) Cardiac Rhythm:  [-] Normal sinus rhythm (08/16 2000) Resp:  [18-20] 20 (08/17 0414) BP: (106-127)/(56-65) 120/56 mmHg (08/17 0414) SpO2:  [97 %-100 %] 97 % (08/17 0414) Weight:  [189 lb 2.5 oz (85.8 kg)] 189 lb 2.5 oz (85.8 kg) (08/17 0414)  Hemodynamic parameters for last 24 hours:    Intake/Output from previous day: 08/16 0701 - 08/17 0700 In: 240 [P.O.:240] Out: 200 [Urine:200] Intake/Output this shift:    General appearance: alert, cooperative and no distress Heart: regular rate and rhythm Lungs: clear to auscultation bilaterally Abdomen: benign Extremities: + LE edema Wound: incis healing well  Lab Results:  Recent Labs  08/30/13 0450  WBC 7.8  HGB 9.4*  HCT 27.9*  PLT 138*   BMET:  Recent Labs  08/29/13 0436 08/30/13 0450  NA 141 139  K 3.9 4.0  CL 106 104  CO2 25 24  GLUCOSE 116* 176*  BUN 13 17  CREATININE 1.19 1.16  CALCIUM 8.3* 8.6    PT/INR: No results found for this basename: LABPROT, INR,  in the last 72 hours ABG    Component Value Date/Time   PHART 7.377 08/27/2013 0157   HCO3 22.5 08/27/2013 0157   TCO2 23 08/27/2013 1552   ACIDBASEDEF 2.0 08/27/2013 0157   O2SAT 99.0 08/27/2013 0157   CBG (last 3)   Recent Labs  08/30/13 1623 08/30/13 2055 08/31/13 0608  GLUCAP 172* 200* 148*   Scheduled Meds: . acetaminophen  1,000 mg Oral 4 times per day   Or  . acetaminophen (TYLENOL) oral liquid 160 mg/5 mL  1,000 mg Per Tube 4 times per day  . aspirin  EC  325 mg Oral Daily  . atorvastatin  40 mg Oral q1800  . bisacodyl  10 mg Oral Daily   Or  . bisacodyl  10 mg Rectal Daily  . docusate sodium  200 mg Oral Daily  . insulin aspart  0-15 Units Subcutaneous TID WC  . lisinopril  2.5 mg Oral Daily  . metFORMIN  500 mg Oral BID WC  . metoprolol tartrate  25 mg Oral BID  . pantoprazole  40 mg Oral Daily  . sodium chloride  3 mL Intravenous Q12H   Continuous Infusions:  PRN Meds:.sodium chloride, ALPRAZolam, alum & mag hydroxide-simeth, guaiFENesin-dextromethorphan, magnesium hydroxide, ondansetron (ZOFRAN) IV, oxyCODONE, sodium chloride, zolpidem  Dg Chest 2 View  08/30/2013   CLINICAL DATA:  Status post CABG  EXAM: CHEST  2 VIEW  COMPARISON:  Radiograph 08/29/2013  FINDINGS: Sternotomy wires overlie normal cardiac silhouette. There is improvement in the left lower lobe atelectasis seen on prior. No pulmonary edema. No infiltrate or pneumothorax.  IMPRESSION: Improvement in left lower lobe atelectasis.   Electronically Signed   By: Suzy Bouchard M.D.   On: 08/30/2013 08:45   Assessment/Plan: S/P Procedure(s) (LRB): CORONARY ARTERY BYPASS GRAFTING (CABG) times five, using left internal mammary artery, right and left greater saphenous vein (N/A) INTRAOPERATIVE  TRANSESOPHAGEAL ECHOCARDIOGRAM (N/A)  1 feels well, need DM education to see prior to d/c to assist with checking sugars . Otherwise appears ready for d/c     LOS: 8 days    Angel Benson 08/31/2013

## 2013-08-31 NOTE — Progress Notes (Signed)
Ed completed with pt. Voiced understanding with good reception. Interested in [facility] and will send referral to [place]. [place] CES, ACSM 9:10 AM 08/31/2013

## 2013-08-31 NOTE — Progress Notes (Signed)
Inpatient Diabetes Program Recommendations  AACE/ADA: New Consensus Statement on Inpatient Glycemic Control (2013)  Target Ranges:  Prepandial:   less than 140 mg/dL      Peak postprandial:   less than 180 mg/dL (1-2 hours)      Critically ill patients:  140 - 180 mg/dL   Inpatient Diabetes Program Recommendations HgbA1C: =7.4 Outpatient Referral: [facility] for [facility]   This coordinator met with patient to discuss new diagnosis DM.  Pt is currently viewing videos on the Patient Ed Network.  He has viewed 1 video for DM and plans to view more after the heart videos.  Living Well with Diabetes patient ed workbook has been given to patient.  Discussed basic A1C information and carb counting.  Pt would like a RX for a glucose meter, strips and lancets at discharge.  Pt is interested in attending the OP diabetes classes at the [facility].  Order for OP ed entered with cosign required.  No further questions/concerns at the end of our visit.  Patient is ready to make lifestyle changes to positively impact his health. Thank you  Raoul Pitch BSN, RN,CDE Inpatient Diabetes Coordinator [phone] (team pager)

## 2013-09-03 ENCOUNTER — Telehealth: Payer: Self-pay | Admitting: *Deleted

## 2013-09-03 NOTE — Telephone Encounter (Signed)
Faxed signed orders for phase 2 cardiac rehab

## 2013-09-17 ENCOUNTER — Encounter: Payer: 59 | Attending: "Endocrinology

## 2013-09-17 VITALS — Ht 69.0 in | Wt 179.7 lb

## 2013-09-17 DIAGNOSIS — Z713 Dietary counseling and surveillance: Secondary | ICD-10-CM | POA: Insufficient documentation

## 2013-09-17 DIAGNOSIS — E119 Type 2 diabetes mellitus without complications: Secondary | ICD-10-CM | POA: Insufficient documentation

## 2013-09-17 DIAGNOSIS — Z0279 Encounter for issue of other medical certificate: Secondary | ICD-10-CM

## 2013-09-22 NOTE — Progress Notes (Signed)
Patient was seen on 09/17/13 for the first of a series of three diabetes self-management courses at the [facility].  Patient Education Plan per assessed needs and concerns is to attend four course education program for Diabetes Self Management Education.  Current HbA1c: 7.4% in 08/2013  The following learning objectives were met by the patient during this class:  Describe diabetes  State some common risk factors for diabetes  Defines the role of glucose and insulin  Identifies type of diabetes and pathophysiology  Describe the relationship between diabetes and cardiovascular risk  State the members of the Healthcare Team  States the rationale for glucose monitoring  State when to test glucose  State their individual Target Range  State the importance of logging glucose readings  Describe how to interpret glucose readings  Identifies A1C target  Explain the correlation between A1c and eAG values  State symptoms and treatment of high blood glucose  State symptoms and treatment of low blood glucose  Explain proper technique for glucose testing  Identifies proper sharps disposal  Handouts given during class include:  Living Well with Diabetes book  Carb Counting and Meal Planning book  Meal Plan Card  Carbohydrate guide  Meal planning worksheet  Low Sodium Flavoring Tips  The diabetes portion plate  F7C to eAG Conversion Chart  Diabetes Medications  Diabetes Recommended Care Schedule  Support Group  Diabetes Success Plan  Core Class Satisfaction Survey  Follow-Up Plan:  Attend core 2

## 2013-09-23 ENCOUNTER — Ambulatory Visit (INDEPENDENT_AMBULATORY_CARE_PROVIDER_SITE_OTHER): Payer: 59 | Admitting: Cardiology

## 2013-09-23 ENCOUNTER — Encounter: Payer: Self-pay | Admitting: Cardiology

## 2013-09-23 VITALS — BP 130/80 | HR 89 | Ht 69.0 in | Wt 178.9 lb

## 2013-09-23 DIAGNOSIS — I2510 Atherosclerotic heart disease of native coronary artery without angina pectoris: Secondary | ICD-10-CM

## 2013-09-23 DIAGNOSIS — I10 Essential (primary) hypertension: Secondary | ICD-10-CM

## 2013-09-23 DIAGNOSIS — I214 Non-ST elevation (NSTEMI) myocardial infarction: Secondary | ICD-10-CM

## 2013-09-23 DIAGNOSIS — E119 Type 2 diabetes mellitus without complications: Secondary | ICD-10-CM

## 2013-09-23 DIAGNOSIS — E785 Hyperlipidemia, unspecified: Secondary | ICD-10-CM

## 2013-09-23 DIAGNOSIS — Z951 Presence of aortocoronary bypass graft: Secondary | ICD-10-CM

## 2013-09-23 NOTE — Progress Notes (Signed)
09/27/2013   PCP: No PCP Per Patient   Chief Complaint  Patient presents with  . post CABG    S/P CABG august 12th. patient reports having no problems since having surgery.    Primary Cardiologist:Dr. Alma Friendly   HPI: 62 year old AA male with no prior cardiac hx was admitted to [facility] 08/24/13 with chest pain, + NSTEMI.  Cardiac cath with severe triple vessel CAD with total occlusion of the distal RCA, severe proximal LAD stenosis involving a long segment, occluded distal LAD, high grade disease in all three obtuse marginal branches and the mid Circumflex.  Mild segmental LV systolic dysfunction.  He then underwent PROCEDURE: Procedure(s):   CORONARY ARTERY BYPASS GRAFTING (CABG)X5 LIMA-LAD; SEQ SVG-OM1-OM2-OM3; SVG-RCA  INTRAOPERATIVE TRANSESOPHAGEAL ECHOCARDIOGRAM EVH RIGHT THIGH OVH RIGHT LOWER AND LEFT LOWER LEG by Dr. Servando Snare 08/26/13.  Pt did well post op, no atrial fib and progressed with out complications.  Since discharge he has done well no complaint today.  Walking 20 min a day- a mile or so.  occ surgical chest pain.  Eating healthy.      No Known Allergies  Current Outpatient Prescriptions  Medication Sig Dispense Refill  . aspirin EC 325 MG EC tablet Take 1 tablet (325 mg total) by mouth daily.      Marland Kitchen lisinopril (PRINIVIL,ZESTRIL) 2.5 MG tablet Take 1 tablet (2.5 mg total) by mouth daily.  30 tablet  1  . metFORMIN (GLUCOPHAGE) 500 MG tablet Take 1 tablet (500 mg total) by mouth 2 (two) times daily with a meal.  60 tablet  1  . metoprolol succinate (TOPROL-XL) 50 MG 24 hr tablet Take 50 mg by mouth daily. Take with or immediately following a meal.      . omeprazole (PRILOSEC) 20 MG capsule Take 20 mg by mouth daily.      Marland Kitchen oxyCODONE (OXY IR/ROXICODONE) 5 MG immediate release tablet Take 1-2 tablets (5-10 mg total) by mouth every 4 (four) hours as needed for moderate pain.  50 tablet  0  . simvastatin (ZOCOR) 20 MG tablet Take 20 mg by mouth daily.      Marland Kitchen  triamcinolone cream (KENALOG) 0.1 % Apply 1 application topically 2 (two) times daily.       No current facility-administered medications for this visit.    Past Medical History  Diagnosis Date  . Diabetes mellitus without complication   . GERD (gastroesophageal reflux disease)   . Hypercholesteremia   . CAD, multiple vessel     s/p CABG    Past Surgical History  Procedure Laterality Date  . Coronary artery bypass graft N/A 08/26/2013    Procedure: CORONARY ARTERY BYPASS GRAFTING (CABG) times five, using left internal mammary artery, right and left greater saphenous vein;  Surgeon: Grace Isaac, MD;  Location: [facility];  Service: Open Heart Surgery;  Laterality: N/A;  LIMA-LAD; SEQ SVG-OM1-OM2-OM3; SVG-RCA   . Intraoperative transesophageal echocardiogram N/A 08/26/2013    Procedure: INTRAOPERATIVE TRANSESOPHAGEAL ECHOCARDIOGRAM;  Surgeon: Grace Isaac, MD;  Location: [facility];  Service: Open Heart Surgery;  Laterality: N/A;  . Cardiac catheterization      EZM:OQHUTML:YY colds or fevers, no weight changes Skin:no rashes or ulcers HEENT:no blurred vision, no congestion CV:see HPI PUL:see HPI GI:no diarrhea constipation or melena, no indigestion GU:no hematuria, no dysuria MS:no joint pain, no claudication Neuro:no syncope, no lightheadedness Endo:+ diabetes stable, no thyroid disease  Wt Readings from Last 3 Encounters:  09/23/13 178 lb 14.4 oz (81.149 kg)  09/22/13 179 lb 11.2 oz (81.511 kg)  08/31/13 189 lb 2.5 oz (85.8 kg)    PHYSICAL EXAM BP 130/80  Pulse 89  Ht 5\' 9"  (1.753 m)  Wt 178 lb 14.4 oz (81.149 kg)  BMI 26.41 kg/m2 General:Pleasant affect, NAD Skin:Warm and dry, brisk capillary refill HEENT:normocephalic, sclera clear, mucus membranes moist Neck:supple, no JVD, no bruits  Heart:S1S2 RRR without murmur, gallup, rub or click, chest wall incision healing well. Lungs:clear without rales, rhonchi, or wheezes MWU:XLKG, non tender, + BS, do not palpate  liver spleen or masses Ext:no lower ext edema, 2+ pedal pulses, 2+ radial pulses Neuro:alert and oriented, MAE, follows commands, + facial symmetry EKG:SR rate of 89, short PR no ischemic changes in ant lat leads as seen previously   ASSESSMENT AND PLAN NSTEMI (non-ST elevated myocardial infarction) Recent NSTEMI, leading to cardiac cath and CABG.  S/P CABG x 5 CORONARY ARTERY BYPASS GRAFTING (CABG)X5 LIMA-LAD; SEQ SVG-OM1-OM2-OM3; SVG-RCA  INTRAOPERATIVE TRANSESOPHAGEAL ECHOCARDIOGRAM EVH RIGHT THIGH OVH RIGHT LOWER AND LEFT LOWER LEG by Dr. Servando Snare 08/26/13.  Progressing well , no irregular or rapid HR.  eating healthy and exercising.  CAD (coronary artery disease), native coronary artery See above  DM2 (diabetes mellitus, type 2) stable  Benign essential HTN stable  Hyperlipidemia Lipid Panel     Component Value Date/Time   CHOL 208* 08/24/2013 0442   TRIG 73 08/24/2013 0442   HDL 41 08/24/2013 0442   CHOLHDL 5.1 08/24/2013 0442   VLDL 15 08/24/2013 0442   LDLCALC 152* 08/24/2013 0442   On zocor 20 mg daily, recheck post op in 6 weeks.  follow up with Dr. Debara Pickett in 4-6 weeks.

## 2013-09-23 NOTE — Patient Instructions (Signed)
Your physician recommends that you schedule a follow-up appointment in: 5 Weeks with Dr Debara Pickett

## 2013-09-24 DIAGNOSIS — E119 Type 2 diabetes mellitus without complications: Secondary | ICD-10-CM

## 2013-09-24 NOTE — Progress Notes (Signed)

## 2013-09-25 ENCOUNTER — Other Ambulatory Visit: Payer: Self-pay | Admitting: Cardiothoracic Surgery

## 2013-09-25 DIAGNOSIS — I214 Non-ST elevation (NSTEMI) myocardial infarction: Secondary | ICD-10-CM

## 2013-09-27 ENCOUNTER — Encounter: Payer: Self-pay | Admitting: Cardiology

## 2013-09-27 NOTE — Assessment & Plan Note (Signed)
stable °

## 2013-09-27 NOTE — Assessment & Plan Note (Signed)
Recent NSTEMI, leading to cardiac cath and CABG.

## 2013-09-27 NOTE — Assessment & Plan Note (Signed)
Lipid Panel     Component Value Date/Time   CHOL 208* 08/24/2013 0442   TRIG 73 08/24/2013 0442   HDL 41 08/24/2013 0442   CHOLHDL 5.1 08/24/2013 0442   VLDL 15 08/24/2013 0442   LDLCALC 152* 08/24/2013 0442   On zocor 20 mg daily, recheck post op in 6 weeks.

## 2013-09-27 NOTE — Assessment & Plan Note (Signed)
See above

## 2013-09-27 NOTE — Assessment & Plan Note (Signed)
CORONARY ARTERY BYPASS GRAFTING (CABG)X5 LIMA-LAD; SEQ SVG-OM1-OM2-OM3; SVG-RCA  INTRAOPERATIVE TRANSESOPHAGEAL ECHOCARDIOGRAM EVH RIGHT THIGH OVH RIGHT LOWER AND LEFT LOWER LEG by Dr. Servando Snare 08/26/13.  Progressing well , no irregular or rapid HR.  eating healthy and exercising.

## 2013-09-28 ENCOUNTER — Ambulatory Visit (INDEPENDENT_AMBULATORY_CARE_PROVIDER_SITE_OTHER): Payer: Self-pay | Admitting: Physician Assistant

## 2013-09-28 ENCOUNTER — Ambulatory Visit
Admission: RE | Admit: 2013-09-28 | Discharge: 2013-09-28 | Disposition: A | Discharge status: Home / Self Care | Payer: 59 | Source: Ambulatory Visit | Attending: Cardiothoracic Surgery | Admitting: Cardiothoracic Surgery

## 2013-09-28 VITALS — BP 112/72 | HR 80 | Ht 69.0 in | Wt 178.0 lb

## 2013-09-28 DIAGNOSIS — I214 Non-ST elevation (NSTEMI) myocardial infarction: Secondary | ICD-10-CM

## 2013-09-28 DIAGNOSIS — Z951 Presence of aortocoronary bypass graft: Secondary | ICD-10-CM

## 2013-09-28 NOTE — Progress Notes (Signed)
  HPI: Patient returns for routine postoperative follow-up having undergone CABG x 5 on 08/26/2013. The patient's early postoperative recovery while in the hospital was unremarkable. Since hospital discharge the patient reports he is doing very well.  He states he feels great and has had difficulties since discharge.  He states he is currently walking about a mile per day, however the rain may limit how far he walks.  His appetite has been normal since surgery and the patient states his blood sugar remains well controlled.  He states his incisions are healing and there has been on evidence of infection present.  He does ask when he can resume doing yard work.  I explained to the patient he would need to wait at least 6 weeks from date of surgery.  If at that point he increases his activity level and develops discomfort he would want to decrease his activity level and give himself a little more time to heal.   Current Outpatient Prescriptions  Medication Sig Dispense Refill  . aspirin EC 325 MG EC tablet Take 1 tablet (325 mg total) by mouth daily.      Marland Kitchen lisinopril (PRINIVIL,ZESTRIL) 2.5 MG tablet Take 1 tablet (2.5 mg total) by mouth daily.  30 tablet  1  . metFORMIN (GLUCOPHAGE) 500 MG tablet Take 1 tablet (500 mg total) by mouth 2 (two) times daily with a meal.  60 tablet  1  . metoprolol succinate (TOPROL-XL) 50 MG 24 hr tablet Take 50 mg by mouth daily. Take with or immediately following a meal.      . omeprazole (PRILOSEC) 20 MG capsule Take 20 mg by mouth daily.      Marland Kitchen oxyCODONE (OXY IR/ROXICODONE) 5 MG immediate release tablet Take 1-2 tablets (5-10 mg total) by mouth every 4 (four) hours as needed for moderate pain.  50 tablet  0  . simvastatin (ZOCOR) 20 MG tablet Take 20 mg by mouth daily.      Marland Kitchen triamcinolone cream (KENALOG) 0.1 % Apply 1 application topically 2 (two) times daily.       No current facility-administered medications for this visit.    Physical Exam:  BP 112/72  Pulse  80  Ht 5\' 9"  (1.753 m)  Wt 178 lb (80.74 kg)  BMI 26.27 kg/m2  SpO2 99%  Gen: No apparent distress Heart: RRR Lungs: CTA bilaterally Incisions: sternotomy well healed, LE incisions are healing some residual scabs present Ext: no lower extremity  Diagnostic Tests:  CXR: looks good, no pneumothorax, minimal posterior pleural effusion on the elft  A/P:  1. S/P CABG x 5- doing very well 2. Patient instructed he may resume driving short distances 3. Patient given instructions regarding sternal precautions and increasing activity level as tolerated 4. RTC in 6 weeks

## 2013-09-28 NOTE — Patient Instructions (Signed)
1. May resume driving short distances if off all Narcotic Pain Medication 2. Observe Sternal Precautions until 10/08/2013, then you may gradually increase activity level, however would keep lifting under 10 lbs for 12 weeks from date of surgery 3. Continue to ambulate Three times per day

## 2013-10-01 ENCOUNTER — Ambulatory Visit: Payer: 59 | Admitting: Cardiothoracic Surgery

## 2013-10-01 DIAGNOSIS — E119 Type 2 diabetes mellitus without complications: Secondary | ICD-10-CM | POA: Diagnosis not present

## 2013-10-02 DIAGNOSIS — Z0271 Encounter for disability determination: Secondary | ICD-10-CM

## 2013-10-05 ENCOUNTER — Ambulatory Visit: Payer: Self-pay | Admitting: Internal Medicine

## 2013-10-05 NOTE — Progress Notes (Signed)
Patient was seen on 10/01/13 for the third of a series of three diabetes self-management courses at the [facility]. The following learning objectives were met by the patient during this class:    State the amount of activity recommended for healthy living   Describe activities suitable for individual needs   Identify ways to regularly incorporate activity into daily life   Identify barriers to activity and ways to over come these barriers  Identify diabetes medications being personally used and their primary action for lowering glucose and possible side effects   Describe role of stress on blood glucose and develop strategies to address psychosocial issues   Identify diabetes complications and ways to prevent them  Explain how to manage diabetes during illness   Evaluate success in meeting personal goal   Establish 2-3 goals that they will plan to diligently work on until they return for the  69-monthfollow-up visit  Goals:  I will count my carb choices at most meals and snacks I will be active 30 minutes or more 7 times a week I will take my diabetes medications as scheduled I will eat less unhealthy fats by eating less red  meat I will test my glucose at least 3 times a day, 7 days a week I will look at patterns in my record book at least 20 days a month To help manage stress I will  walk 14 times a week  Barriers to success: None, "Now I'm motivated" [facility], [facility] support  Plan:  Attend Core 4 in 4 months  Bring log book to all healthcare appointments

## 2013-10-13 ENCOUNTER — Ambulatory Visit: Payer: 59

## 2013-10-22 ENCOUNTER — Encounter (HOSPITAL_COMMUNITY)
Admission: RE | Admit: 2013-10-22 | Discharge: 2013-10-22 | Disposition: A | Discharge status: Home / Self Care | Payer: 59 | Source: Ambulatory Visit | Attending: Internal Medicine | Admitting: Internal Medicine

## 2013-10-22 DIAGNOSIS — E785 Hyperlipidemia, unspecified: Secondary | ICD-10-CM | POA: Insufficient documentation

## 2013-10-22 DIAGNOSIS — I10 Essential (primary) hypertension: Secondary | ICD-10-CM | POA: Insufficient documentation

## 2013-10-22 DIAGNOSIS — I252 Old myocardial infarction: Secondary | ICD-10-CM | POA: Insufficient documentation

## 2013-10-22 DIAGNOSIS — Z7982 Long term (current) use of aspirin: Secondary | ICD-10-CM | POA: Insufficient documentation

## 2013-10-22 DIAGNOSIS — K219 Gastro-esophageal reflux disease without esophagitis: Secondary | ICD-10-CM | POA: Insufficient documentation

## 2013-10-22 DIAGNOSIS — Z951 Presence of aortocoronary bypass graft: Secondary | ICD-10-CM | POA: Insufficient documentation

## 2013-10-22 DIAGNOSIS — Z833 Family history of diabetes mellitus: Secondary | ICD-10-CM | POA: Insufficient documentation

## 2013-10-22 DIAGNOSIS — I6529 Occlusion and stenosis of unspecified carotid artery: Secondary | ICD-10-CM | POA: Insufficient documentation

## 2013-10-22 DIAGNOSIS — Z79899 Other long term (current) drug therapy: Secondary | ICD-10-CM | POA: Insufficient documentation

## 2013-10-22 DIAGNOSIS — Z8249 Family history of ischemic heart disease and other diseases of the circulatory system: Secondary | ICD-10-CM | POA: Insufficient documentation

## 2013-10-22 DIAGNOSIS — E780 Pure hypercholesterolemia: Secondary | ICD-10-CM | POA: Insufficient documentation

## 2013-10-22 DIAGNOSIS — I2510 Atherosclerotic heart disease of native coronary artery without angina pectoris: Secondary | ICD-10-CM | POA: Insufficient documentation

## 2013-10-22 DIAGNOSIS — E119 Type 2 diabetes mellitus without complications: Secondary | ICD-10-CM | POA: Insufficient documentation

## 2013-10-22 NOTE — Progress Notes (Signed)
Cardiac Rehab Medication Review by a Pharmacist  Does the patient  feel that his/her medications are working for him/her?  yes  Has the patient been experiencing any side effects to the medications prescribed?  yes  Does the patient measure his/her own blood pressure or blood glucose at home?  yes - blood sugar  Does the patient have any problems obtaining medications due to transportation or finances?   no  Understanding of regimen: good Understanding of indications: good Potential of compliance: fair    Pharmacist comments: Patient rates himself as fair compliance. Runs out of his metoprolol sometimes and misses simvastatin at night occasionally. Patient has good understanding of medications.  Says he never has taken the lisinopril listed on his medication list. Doesn't have medication.  Elicia Lamp, PharmD Clinical Pharmacist - Resident Pager [phone] 10/22/2013 8:24 AM

## 2013-10-26 ENCOUNTER — Encounter (HOSPITAL_COMMUNITY)
Admission: RE | Admit: 2013-10-26 | Discharge: 2013-10-26 | Disposition: A | Discharge status: Home / Self Care | Payer: 59 | Source: Ambulatory Visit | Attending: Internal Medicine | Admitting: Internal Medicine

## 2013-10-26 ENCOUNTER — Telehealth: Payer: Self-pay | Admitting: *Deleted

## 2013-10-26 DIAGNOSIS — Z833 Family history of diabetes mellitus: Secondary | ICD-10-CM | POA: Diagnosis not present

## 2013-10-26 DIAGNOSIS — Z951 Presence of aortocoronary bypass graft: Secondary | ICD-10-CM | POA: Diagnosis not present

## 2013-10-26 DIAGNOSIS — K219 Gastro-esophageal reflux disease without esophagitis: Secondary | ICD-10-CM | POA: Diagnosis not present

## 2013-10-26 DIAGNOSIS — E785 Hyperlipidemia, unspecified: Secondary | ICD-10-CM | POA: Diagnosis not present

## 2013-10-26 DIAGNOSIS — E780 Pure hypercholesterolemia: Secondary | ICD-10-CM | POA: Diagnosis not present

## 2013-10-26 DIAGNOSIS — I6529 Occlusion and stenosis of unspecified carotid artery: Secondary | ICD-10-CM | POA: Diagnosis not present

## 2013-10-26 DIAGNOSIS — Z8249 Family history of ischemic heart disease and other diseases of the circulatory system: Secondary | ICD-10-CM | POA: Diagnosis not present

## 2013-10-26 DIAGNOSIS — Z79899 Other long term (current) drug therapy: Secondary | ICD-10-CM | POA: Diagnosis not present

## 2013-10-26 DIAGNOSIS — I252 Old myocardial infarction: Secondary | ICD-10-CM | POA: Diagnosis not present

## 2013-10-26 DIAGNOSIS — Z7982 Long term (current) use of aspirin: Secondary | ICD-10-CM | POA: Diagnosis not present

## 2013-10-26 DIAGNOSIS — I2510 Atherosclerotic heart disease of native coronary artery without angina pectoris: Secondary | ICD-10-CM | POA: Diagnosis not present

## 2013-10-26 DIAGNOSIS — I10 Essential (primary) hypertension: Secondary | ICD-10-CM | POA: Diagnosis not present

## 2013-10-26 DIAGNOSIS — E119 Type 2 diabetes mellitus without complications: Secondary | ICD-10-CM | POA: Diagnosis not present

## 2013-10-26 DIAGNOSIS — Z5189 Encounter for other specified aftercare: Secondary | ICD-10-CM | POA: Diagnosis present

## 2013-10-26 LAB — GLUCOSE, CAPILLARY
GLUCOSE-CAPILLARY: 110 mg/dL — AB (ref 70–99)
Glucose-Capillary: 135 mg/dL — ABNORMAL HIGH (ref 70–99)

## 2013-10-26 MED ORDER — LISINOPRIL 2.5 MG PO TABS: 2.5000 mg | ORAL_TABLET | Freq: Every day | ORAL | Status: DC

## 2013-10-26 NOTE — Progress Notes (Signed)
Pt in today for his first day of exercise in the 6:45 exercise class time. Pt tolerated exercise with no complaints. Monitor showed SR with ST depression, no noted ectopy.  Medication list reconciled.  Noted that Lisinopril 2.5 mg is on his med list, d/c summary and follow up office note however pt reports that he has never taken this medication.  Will plan to in basket dr. Debara Pickett to determine whether this medication is appropriate for this patient. Await response. PHQ2 score 0  Pt does not "buy in " to depression and feels he is doing well in his recovery.  Pt feels that anyone that wishes him well and given him encouragement is a part of his support system.  Pt long term short term goal is to run again.  Will be able to implement light jogging with MD approval as PT improves his met level and no longer require sternal consideration.  Long term goal is to feel good and reduce shortness of breath.  Will plan to teach pt purse lip breathing to maximize his oxygen use.   Will periodicaly check in with pt to measure his progress of meeting these goals.  Cherre Huger, BSN

## 2013-10-26 NOTE — Telephone Encounter (Signed)
Patient was to be on lisinopril 2.5mg  per hospital discharge summary >> presented to cardiac rehab and stated he never got a Rx for this Rx was sent to pharmacy electronically.

## 2013-10-28 ENCOUNTER — Encounter (HOSPITAL_COMMUNITY)
Admission: RE | Admit: 2013-10-28 | Discharge: 2013-10-28 | Disposition: A | Discharge status: Home / Self Care | Payer: 59 | Source: Ambulatory Visit | Attending: Internal Medicine | Admitting: Internal Medicine

## 2013-10-28 DIAGNOSIS — I252 Old myocardial infarction: Secondary | ICD-10-CM | POA: Diagnosis not present

## 2013-10-29 ENCOUNTER — Encounter: Payer: Self-pay | Admitting: Internal Medicine

## 2013-10-29 ENCOUNTER — Other Ambulatory Visit: Payer: Self-pay | Admitting: *Deleted

## 2013-10-29 ENCOUNTER — Ambulatory Visit (INDEPENDENT_AMBULATORY_CARE_PROVIDER_SITE_OTHER): Payer: 59 | Admitting: Internal Medicine

## 2013-10-29 VITALS — BP 106/64 | HR 78 | Ht 69.0 in | Wt 175.0 lb

## 2013-10-29 DIAGNOSIS — I214 Non-ST elevation (NSTEMI) myocardial infarction: Secondary | ICD-10-CM

## 2013-10-29 DIAGNOSIS — I2510 Atherosclerotic heart disease of native coronary artery without angina pectoris: Secondary | ICD-10-CM

## 2013-10-29 DIAGNOSIS — I10 Essential (primary) hypertension: Secondary | ICD-10-CM

## 2013-10-29 DIAGNOSIS — Z951 Presence of aortocoronary bypass graft: Secondary | ICD-10-CM

## 2013-10-29 DIAGNOSIS — E785 Hyperlipidemia, unspecified: Secondary | ICD-10-CM

## 2013-10-29 DIAGNOSIS — E118 Type 2 diabetes mellitus with unspecified complications: Secondary | ICD-10-CM

## 2013-10-29 NOTE — Progress Notes (Signed)
10/29/2013   PCP: No PCP Per Patient   Chief Complaint  Patient presents with  . Follow-up    1 month - s/p CABG    Primary Cardiologist:Dr. Alma Friendly   HPI: 63 year old AA male with no prior cardiac hx was admitted to [facility] 08/24/13 with chest pain, + NSTEMI.  Cardiac cath with severe triple vessel CAD with total occlusion of the distal RCA, severe proximal LAD stenosis involving a long segment, occluded distal LAD, high grade disease in all three obtuse marginal branches and the mid Circumflex.  Mild segmental LV systolic dysfunction.  He then underwent PROCEDURE: Procedure(s):   CORONARY ARTERY BYPASS GRAFTING (CABG)X5 LIMA-LAD; SEQ SVG-OM1-OM2-OM3; SVG-RCA  INTRAOPERATIVE TRANSESOPHAGEAL ECHOCARDIOGRAM EVH RIGHT THIGH OVH RIGHT LOWER AND LEFT LOWER LEG by Dr. Servando Snare 08/26/13.  Pt did well post op, no atrial fib and progressed with out complications.  Since discharge he has done well no complaint today.  Walking 20 min a day- a mile or so.  occ surgical chest pain.  Eating healthy.    Angel Benson has no complaints today. He follows up and is doing well. He started cardiac rehabilitation and is doing well with his exercise. Denies any chest pain. He was supposed to be on lisinopril however the prescription was never placed. We did go ahead and start low-dose lisinopril and he is taking it without complications.   No Known Allergies  Current Outpatient Prescriptions  Medication Sig Dispense Refill  . aspirin 81 MG tablet Take 81 mg by mouth daily.      Marland Kitchen lisinopril (PRINIVIL,ZESTRIL) 2.5 MG tablet Take 1 tablet (2.5 mg total) by mouth daily.  30 tablet  6  . metFORMIN (GLUCOPHAGE) 500 MG tablet Take 1 tablet (500 mg total) by mouth 2 (two) times daily with a meal.  60 tablet  1  . metoprolol succinate (TOPROL-XL) 50 MG 24 hr tablet Take 50 mg by mouth daily. Take with or immediately following a meal.      . omeprazole (PRILOSEC) 20 MG capsule Take 20 mg by mouth daily.       Marland Kitchen oxyCODONE (OXY IR/ROXICODONE) 5 MG immediate release tablet as needed.      . simvastatin (ZOCOR) 20 MG tablet Take 20 mg by mouth daily.      Marland Kitchen triamcinolone cream (KENALOG) 0.1 % Apply 1 application topically 2 (two) times daily.       No current facility-administered medications for this visit.    Past Medical History  Diagnosis Date  . Diabetes mellitus without complication   . GERD (gastroesophageal reflux disease)   . Hypercholesteremia   . CAD, multiple vessel     s/p CABG    Past Surgical History  Procedure Laterality Date  . Coronary artery bypass graft N/A 08/26/2013    Procedure: CORONARY ARTERY BYPASS GRAFTING (CABG) times five, using left internal mammary artery, right and left greater saphenous vein;  Surgeon: Grace Isaac, MD;  Location: [place];  Service: Open Heart Surgery;  Laterality: N/A;  LIMA-LAD; SEQ SVG-OM1-OM2-OM3; SVG-RCA   . Intraoperative transesophageal echocardiogram N/A 08/26/2013    Procedure: INTRAOPERATIVE TRANSESOPHAGEAL ECHOCARDIOGRAM;  Surgeon: Grace Isaac, MD;  Location: [place];  Service: Open Heart Surgery;  Laterality: N/A;  . Cardiac catheterization      UMP:NTIRWER:XV colds or fevers, no weight changes Skin:no rashes or ulcers HEENT:no blurred vision, no congestion CV:see HPI PUL:see HPI GI:no diarrhea constipation or melena, no indigestion GU:no  hematuria, no dysuria MS:no joint pain, no claudication Neuro:no syncope, no lightheadedness Endo:+ diabetes stable, no thyroid disease  Wt Readings from Last 3 Encounters:  10/29/13 175 lb (79.379 kg)  10/22/13 174 lb 9.7 oz (79.2 kg)  09/28/13 178 lb (80.74 kg)    PHYSICAL EXAM BP 106/64  Pulse 78  Ht 5\' 9"  (1.753 m)  Wt 175 lb (79.379 kg)  BMI 25.83 kg/m2 Deferred  EKG: deferred  ASSESSMENT AND PLAN Patient Active Problem List   Diagnosis Date Noted  . S/P CABG x 5 08/26/2013  . Hyperlipidemia 08/25/2013  . CAD (coronary artery disease), native coronary artery  08/25/2013  . Benign essential HTN 08/25/2013  . DM2 (diabetes mellitus, type 2) 08/25/2013  . NSTEMI (non-ST elevated myocardial infarction) 08/24/2013   PLAN: 1.  Angel Benson is doing well post bypass. He is on appropriate medications. He scheduled to see Dr. Servando Snare in 2 weeks. He should continue cardiac rehabilitation and Dr. Servando Snare feels it is appropriate for him to go back to work he could clear him at that time. I would like to see him back in 6 months.  Pixie Casino, MD, [facility] Attending Cardiologist [place]

## 2013-10-29 NOTE — Patient Instructions (Signed)
Your physician wants you to follow-up in: 6 months. You will receive a reminder letter in the mail two months in advance. If you don't receive a letter, please call our office to schedule the follow-up appointment.  Decrease aspirin to 81mg  once daily

## 2013-10-30 ENCOUNTER — Encounter (HOSPITAL_COMMUNITY)
Admission: RE | Admit: 2013-10-30 | Discharge: 2013-10-30 | Disposition: A | Discharge status: Home / Self Care | Payer: 59 | Source: Ambulatory Visit | Attending: Internal Medicine | Admitting: Internal Medicine

## 2013-10-30 DIAGNOSIS — I252 Old myocardial infarction: Secondary | ICD-10-CM | POA: Diagnosis not present

## 2013-10-30 LAB — GLUCOSE, CAPILLARY
GLUCOSE-CAPILLARY: 96 mg/dL (ref 70–99)
GLUCOSE-CAPILLARY: 114 mg/dL — AB (ref 70–99)

## 2013-10-30 NOTE — Progress Notes (Signed)
Nutrition Note Spoke with pt. Pt fasting CBG this am 94 mg/dL. Pt reports eating lasagna, juice, a peach, and smoked [place] leg 1 hour before exercise. Pt pre-exercise CBG was 96 mg/dL. Post-exercise CBG 114 mg/dL. Pt states he has made changes to his diet and exercise regimen since his heart event. Last A1c was 2 months ago on the day of his procedure. Pt to try holding Metformin in the morning and check his CBG's before lunch from Saturday 10/17 through [facility] 10/21. Will evaluate pt's CBG's with team and notify pt's MD as appropriate re: CBG control. Continue client-centered nutrition education by RD as part of interdisciplinary care.  Monitor and evaluate progress toward nutrition goal with team.  Derek Mound, M.Ed, RD, LDN, CDE 10/30/2013 8:46 AM

## 2013-11-02 ENCOUNTER — Encounter (HOSPITAL_COMMUNITY)
Admission: RE | Admit: 2013-11-02 | Discharge: 2013-11-02 | Disposition: A | Discharge status: Home / Self Care | Payer: 59 | Source: Ambulatory Visit | Attending: Internal Medicine | Admitting: Internal Medicine

## 2013-11-02 DIAGNOSIS — I252 Old myocardial infarction: Secondary | ICD-10-CM | POA: Diagnosis not present

## 2013-11-02 LAB — GLUCOSE, CAPILLARY
GLUCOSE-CAPILLARY: 121 mg/dL — AB (ref 70–99)
Glucose-Capillary: 114 mg/dL — ABNORMAL HIGH (ref 70–99)

## 2013-11-04 ENCOUNTER — Encounter (HOSPITAL_COMMUNITY)
Admission: RE | Admit: 2013-11-04 | Discharge: 2013-11-04 | Disposition: A | Discharge status: Home / Self Care | Payer: 59 | Source: Ambulatory Visit | Attending: Internal Medicine | Admitting: Internal Medicine

## 2013-11-04 DIAGNOSIS — I252 Old myocardial infarction: Secondary | ICD-10-CM | POA: Diagnosis not present

## 2013-11-04 LAB — GLUCOSE, CAPILLARY
Glucose-Capillary: 107 mg/dL — ABNORMAL HIGH (ref 70–99)
Glucose-Capillary: 122 mg/dL — ABNORMAL HIGH (ref 70–99)

## 2013-11-06 ENCOUNTER — Encounter (HOSPITAL_COMMUNITY)
Admission: RE | Admit: 2013-11-06 | Discharge: 2013-11-06 | Disposition: A | Discharge status: Home / Self Care | Payer: 59 | Source: Ambulatory Visit | Attending: Internal Medicine | Admitting: Internal Medicine

## 2013-11-06 DIAGNOSIS — I252 Old myocardial infarction: Secondary | ICD-10-CM | POA: Diagnosis not present

## 2013-11-06 LAB — GLUCOSE, CAPILLARY: GLUCOSE-CAPILLARY: 97 mg/dL (ref 70–99)

## 2013-11-09 ENCOUNTER — Encounter (HOSPITAL_COMMUNITY)
Admission: RE | Admit: 2013-11-09 | Discharge: 2013-11-09 | Disposition: A | Discharge status: Home / Self Care | Payer: 59 | Source: Ambulatory Visit | Attending: Internal Medicine | Admitting: Internal Medicine

## 2013-11-09 DIAGNOSIS — I252 Old myocardial infarction: Secondary | ICD-10-CM | POA: Diagnosis not present

## 2013-11-09 LAB — GLUCOSE, CAPILLARY: GLUCOSE-CAPILLARY: 111 mg/dL — AB (ref 70–99)

## 2013-11-09 NOTE — Progress Notes (Signed)
I have reviewed home exercise with Angel Benson. The patient was advised to walk 2-4 days per week outside of CRP II for 30 minutes continuously.  Pt will also complete one additional day of hand weights outside of CRP II.  Progression of exercise prescription was discussed.  Reviewed THR, pulse, RPE, sign and symptoms and when to call 911 or MD.  Pt voiced understanding. [id number]  Archie Endo, MS, ACSM RCEP 11/09/2013 8:05 AM

## 2013-11-11 ENCOUNTER — Encounter (HOSPITAL_COMMUNITY)
Admission: RE | Admit: 2013-11-11 | Discharge: 2013-11-11 | Disposition: A | Discharge status: Home / Self Care | Payer: 59 | Source: Ambulatory Visit | Attending: Internal Medicine | Admitting: Internal Medicine

## 2013-11-11 DIAGNOSIS — I252 Old myocardial infarction: Secondary | ICD-10-CM | POA: Diagnosis not present

## 2013-11-11 LAB — GLUCOSE, CAPILLARY: Glucose-Capillary: 104 mg/dL — ABNORMAL HIGH (ref 70–99)

## 2013-11-12 ENCOUNTER — Encounter: Payer: Self-pay | Admitting: Cardiothoracic Surgery

## 2013-11-12 ENCOUNTER — Ambulatory Visit (INDEPENDENT_AMBULATORY_CARE_PROVIDER_SITE_OTHER): Payer: Self-pay | Admitting: Cardiothoracic Surgery

## 2013-11-12 VITALS — BP 108/71 | HR 86 | Ht 69.0 in | Wt 165.0 lb

## 2013-11-12 DIAGNOSIS — Z951 Presence of aortocoronary bypass graft: Secondary | ICD-10-CM

## 2013-11-12 NOTE — Progress Notes (Signed)
[address]       [place] [address]             [phone]      Delwin Pallo [place] Medical Record #[id number] Date of Birth: 05/17/1950  Referring: Burnell Blanks* Primary Care: No PCP Per Patient  Chief Complaint:   POST OP FOLLOW UP 08/26/2013  PREOPERATIVE DIAGNOSIS: Severe three-vessel coronary artery disease  with recent subendocardial myocardial infarction.  POSTOPERATIVE DIAGNOSIS: Severe three-vessel coronary artery disease  with recent subendocardial myocardial infarction.  SURGICAL PROCEDURE: Coronary artery bypass grafting x5 with left  internal mammary to the left anterior descending coronary artery, a  sequential reverse saphenous vein graft to the first, second, and third  obtuse marginal coronary artery, reverse saphenous vein graft to the  posterior descending coronary artery with right thigh endo vein  harvesting and open right lower leg vein harvest.  SURGEON: Lanelle Bal, MD  History of Present Illness:     Patient doing well postoperatively, now fully engaged in cardiac rehabilitation with 9 weeks remaining. He has had no evidence of congestive heart failure. Has been working diligently to keep his diabetes controlled, and checking his glucose.     Past Medical History  Diagnosis Date  . Diabetes mellitus without complication   . GERD (gastroesophageal reflux disease)   . Hypercholesteremia   . CAD, multiple vessel     s/p CABG     History  Smoking status  . Never Smoker   Smokeless tobacco  . Never Used    History  Alcohol Use No     No Known Allergies  Current Outpatient Prescriptions  Medication Sig Dispense Refill  . aspirin 81 MG tablet Take 81 mg by mouth daily.      Marland Kitchen lisinopril (PRINIVIL,ZESTRIL) 2.5 MG tablet Take 1 tablet (2.5 mg total) by mouth daily.  30 tablet  6  . metFORMIN (GLUCOPHAGE) 500 MG tablet Take 1 tablet (500 mg total) by mouth 2 (two) times daily with a meal.  60  tablet  1  . metoprolol succinate (TOPROL-XL) 50 MG 24 hr tablet Take 50 mg by mouth daily. Take with or immediately following a meal.      . omeprazole (PRILOSEC) 20 MG capsule Take 20 mg by mouth daily.      . simvastatin (ZOCOR) 20 MG tablet Take 20 mg by mouth daily.      Marland Kitchen triamcinolone cream (KENALOG) 0.1 % Apply 1 application topically 2 (two) times daily.      Marland Kitchen oxyCODONE (OXY IR/ROXICODONE) 5 MG immediate release tablet as needed.       No current facility-administered medications for this visit.       Physical Exam: BP 108/71  Pulse 86  Ht 5\' 9"  (1.753 m)  Wt 165 lb (74.844 kg)  BMI 24.36 kg/m2  SpO2 92%  General appearance: alert and cooperative Neurologic: intact Heart: regular rate and rhythm, S1, S2 normal, no murmur, click, rub or gallop Lungs: clear to auscultation bilaterally Abdomen: soft, non-tender; bowel sounds normal; no masses,  no organomegaly Extremities: extremities normal, atraumatic, no cyanosis or edema and Homans sign is negative, no sign of DVT Wound: Sternum is stable and well-healed   Diagnostic Studies & Laboratory data:     Recent Radiology Findings:   No results found.    Recent Lab Findings: Lab Results  Component Value Date   WBC 7.8 08/30/2013   HGB 9.4* 08/30/2013   HCT 27.9* 08/30/2013  PLT 138* 08/30/2013   GLUCOSE 176* 08/30/2013   CHOL 208* 08/24/2013   TRIG 73 08/24/2013   HDL 41 08/24/2013   LDLCALC 152* 08/24/2013   ALT 16 08/29/2013   AST 20 08/29/2013   NA 139 08/30/2013   K 4.0 08/30/2013   CL 104 08/30/2013   CREATININE 1.16 08/30/2013   BUN 17 08/30/2013   CO2 24 08/30/2013   INR 1.42 08/26/2013   HGBA1C 7.4* 08/24/2013      Assessment / Plan:     Patient doing well postoperatively, to return to full work which involved lifting on November 16 So far he has not seen primary care just before surgery he had an appointment arranged. I've encouraged him to be diligent about his diabetes control Plan to see him back as  necessary at his or cardiology request.       Grace Isaac MD      [address] [place] [address] Office [phone]   Beeper [phone]  11/12/2013 10:26 AM

## 2013-11-13 ENCOUNTER — Encounter (HOSPITAL_COMMUNITY)
Admission: RE | Admit: 2013-11-13 | Discharge: 2013-11-13 | Disposition: A | Discharge status: Home / Self Care | Payer: 59 | Source: Ambulatory Visit | Attending: Internal Medicine | Admitting: Internal Medicine

## 2013-11-13 DIAGNOSIS — I252 Old myocardial infarction: Secondary | ICD-10-CM | POA: Diagnosis not present

## 2013-11-13 LAB — GLUCOSE, CAPILLARY: GLUCOSE-CAPILLARY: 100 mg/dL — AB (ref 70–99)

## 2013-11-16 ENCOUNTER — Encounter (HOSPITAL_COMMUNITY)
Admission: RE | Admit: 2013-11-16 | Discharge: 2013-11-16 | Disposition: A | Discharge status: Home / Self Care | Payer: 59 | Source: Ambulatory Visit | Attending: Internal Medicine | Admitting: Internal Medicine

## 2013-11-16 DIAGNOSIS — I10 Essential (primary) hypertension: Secondary | ICD-10-CM | POA: Insufficient documentation

## 2013-11-16 DIAGNOSIS — Z8249 Family history of ischemic heart disease and other diseases of the circulatory system: Secondary | ICD-10-CM | POA: Insufficient documentation

## 2013-11-16 DIAGNOSIS — Z79899 Other long term (current) drug therapy: Secondary | ICD-10-CM | POA: Diagnosis not present

## 2013-11-16 DIAGNOSIS — E780 Pure hypercholesterolemia: Secondary | ICD-10-CM | POA: Diagnosis not present

## 2013-11-16 DIAGNOSIS — Z951 Presence of aortocoronary bypass graft: Secondary | ICD-10-CM | POA: Insufficient documentation

## 2013-11-16 DIAGNOSIS — K219 Gastro-esophageal reflux disease without esophagitis: Secondary | ICD-10-CM | POA: Insufficient documentation

## 2013-11-16 DIAGNOSIS — I252 Old myocardial infarction: Secondary | ICD-10-CM | POA: Diagnosis not present

## 2013-11-16 DIAGNOSIS — I6529 Occlusion and stenosis of unspecified carotid artery: Secondary | ICD-10-CM | POA: Diagnosis not present

## 2013-11-16 DIAGNOSIS — I2510 Atherosclerotic heart disease of native coronary artery without angina pectoris: Secondary | ICD-10-CM | POA: Insufficient documentation

## 2013-11-16 DIAGNOSIS — E119 Type 2 diabetes mellitus without complications: Secondary | ICD-10-CM | POA: Insufficient documentation

## 2013-11-16 DIAGNOSIS — E785 Hyperlipidemia, unspecified: Secondary | ICD-10-CM | POA: Diagnosis not present

## 2013-11-16 DIAGNOSIS — Z7982 Long term (current) use of aspirin: Secondary | ICD-10-CM | POA: Insufficient documentation

## 2013-11-16 DIAGNOSIS — Z833 Family history of diabetes mellitus: Secondary | ICD-10-CM | POA: Insufficient documentation

## 2013-11-16 DIAGNOSIS — Z5189 Encounter for other specified aftercare: Secondary | ICD-10-CM | POA: Diagnosis present

## 2013-11-16 LAB — GLUCOSE, CAPILLARY
GLUCOSE-CAPILLARY: 85 mg/dL (ref 70–99)
Glucose-Capillary: 118 mg/dL — ABNORMAL HIGH (ref 70–99)

## 2013-11-18 ENCOUNTER — Encounter (HOSPITAL_COMMUNITY)
Admission: RE | Admit: 2013-11-18 | Discharge: 2013-11-18 | Disposition: A | Discharge status: Home / Self Care | Payer: 59 | Source: Ambulatory Visit | Attending: Internal Medicine | Admitting: Internal Medicine

## 2013-11-18 DIAGNOSIS — I252 Old myocardial infarction: Secondary | ICD-10-CM | POA: Diagnosis not present

## 2013-11-18 LAB — GLUCOSE, CAPILLARY: Glucose-Capillary: 112 mg/dL — ABNORMAL HIGH (ref 70–99)

## 2013-11-20 ENCOUNTER — Encounter (HOSPITAL_COMMUNITY)
Admission: RE | Admit: 2013-11-20 | Discharge: 2013-11-20 | Disposition: A | Discharge status: Home / Self Care | Payer: 59 | Source: Ambulatory Visit | Attending: Internal Medicine | Admitting: Internal Medicine

## 2013-11-20 DIAGNOSIS — I252 Old myocardial infarction: Secondary | ICD-10-CM | POA: Diagnosis not present

## 2013-11-20 LAB — GLUCOSE, CAPILLARY: GLUCOSE-CAPILLARY: 109 mg/dL — AB (ref 70–99)

## 2013-11-23 ENCOUNTER — Encounter (HOSPITAL_COMMUNITY)
Admission: RE | Admit: 2013-11-23 | Discharge: 2013-11-23 | Disposition: A | Discharge status: Home / Self Care | Payer: 59 | Source: Ambulatory Visit | Attending: Internal Medicine | Admitting: Internal Medicine

## 2013-11-23 DIAGNOSIS — I252 Old myocardial infarction: Secondary | ICD-10-CM | POA: Diagnosis not present

## 2013-11-25 ENCOUNTER — Encounter (HOSPITAL_COMMUNITY)
Admission: RE | Admit: 2013-11-25 | Discharge: 2013-11-25 | Disposition: A | Discharge status: Home / Self Care | Payer: 59 | Source: Ambulatory Visit | Attending: Internal Medicine | Admitting: Internal Medicine

## 2013-11-25 DIAGNOSIS — I252 Old myocardial infarction: Secondary | ICD-10-CM | POA: Diagnosis not present

## 2013-11-26 ENCOUNTER — Telehealth: Payer: Self-pay | Admitting: Internal Medicine

## 2013-11-26 NOTE — Telephone Encounter (Signed)
Lm for Dr Thomes Dinning office that we will contact them with Dr [facility] recommendation.

## 2013-11-26 NOTE — Telephone Encounter (Signed)
Pt is going to have a dental cleaning. She needs to know if pt needs antibiotic or pre meds before cleaning?

## 2013-11-26 NOTE — Telephone Encounter (Signed)
This is Dr Debara Pickett patient. I will defer to Dr Debara Pickett

## 2013-11-26 NOTE — Telephone Encounter (Signed)
FORWARD TO DR Courtney Heys RN

## 2013-11-27 ENCOUNTER — Encounter (HOSPITAL_COMMUNITY)
Admission: RE | Admit: 2013-11-27 | Discharge: 2013-11-27 | Disposition: A | Discharge status: Home / Self Care | Payer: 59 | Source: Ambulatory Visit | Attending: Internal Medicine | Admitting: Internal Medicine

## 2013-11-27 DIAGNOSIS — I252 Old myocardial infarction: Secondary | ICD-10-CM | POA: Diagnosis not present

## 2013-11-27 NOTE — Telephone Encounter (Signed)
No antibiotics are necessary for dental work - he had CABG, but no valve surgery.  Dr. Lemmie Evens

## 2013-11-27 NOTE — Telephone Encounter (Signed)
Letter created and signed by Dr Debara Pickett.    I need the fax number for Dr Thomes Dinning office.  I tried to call the office, but no answer.  Will attempt later.

## 2013-11-30 ENCOUNTER — Encounter (HOSPITAL_COMMUNITY)
Admission: RE | Admit: 2013-11-30 | Discharge: 2013-11-30 | Disposition: A | Discharge status: Home / Self Care | Payer: 59 | Source: Ambulatory Visit | Attending: Internal Medicine | Admitting: Internal Medicine

## 2013-11-30 DIAGNOSIS — I252 Old myocardial infarction: Secondary | ICD-10-CM | POA: Diagnosis not present

## 2013-12-01 NOTE — Telephone Encounter (Signed)
[phone] fax number.  Letter faxed

## 2013-12-02 ENCOUNTER — Encounter (HOSPITAL_COMMUNITY)
Admission: RE | Admit: 2013-12-02 | Discharge: 2013-12-02 | Disposition: A | Discharge status: Home / Self Care | Payer: 59 | Source: Ambulatory Visit | Attending: Internal Medicine | Admitting: Internal Medicine

## 2013-12-02 DIAGNOSIS — I252 Old myocardial infarction: Secondary | ICD-10-CM | POA: Diagnosis not present

## 2013-12-03 ENCOUNTER — Encounter: Payer: Self-pay | Admitting: Family

## 2013-12-03 ENCOUNTER — Other Ambulatory Visit (INDEPENDENT_AMBULATORY_CARE_PROVIDER_SITE_OTHER): Payer: 59

## 2013-12-03 ENCOUNTER — Ambulatory Visit (INDEPENDENT_AMBULATORY_CARE_PROVIDER_SITE_OTHER): Payer: 59 | Admitting: Family

## 2013-12-03 ENCOUNTER — Telehealth: Payer: Self-pay | Admitting: Family

## 2013-12-03 DIAGNOSIS — E1165 Type 2 diabetes mellitus with hyperglycemia: Secondary | ICD-10-CM

## 2013-12-03 DIAGNOSIS — IMO0002 Reserved for concepts with insufficient information to code with codable children: Secondary | ICD-10-CM

## 2013-12-03 DIAGNOSIS — E785 Hyperlipidemia, unspecified: Secondary | ICD-10-CM

## 2013-12-03 DIAGNOSIS — E119 Type 2 diabetes mellitus without complications: Secondary | ICD-10-CM

## 2013-12-03 DIAGNOSIS — I10 Essential (primary) hypertension: Secondary | ICD-10-CM

## 2013-12-03 LAB — BASIC METABOLIC PANEL
BUN: 17 mg/dL (ref 6–23)
CHLORIDE: 102 meq/L (ref 96–112)
CO2: 26 meq/L (ref 19–32)
CREATININE: 1.2 mg/dL (ref 0.4–1.5)
Calcium: 9 mg/dL (ref 8.4–10.5)
GFR: 79.7 mL/min (ref >60.00)
Glucose, Bld: 99 mg/dL (ref 70–99)
Potassium: 4.1 mEq/L (ref 3.5–5.1)
Sodium: 137 mEq/L (ref 135–145)

## 2013-12-03 LAB — HEMOGLOBIN A1C: Hgb A1c MFr Bld: 6.2 % (ref 4.6–6.5)

## 2013-12-03 NOTE — Assessment & Plan Note (Signed)
Stable.  Continue current doses of lisinopril and metoprolol

## 2013-12-03 NOTE — Patient Instructions (Signed)
Thank you for choosing [facility].  Summary/Instructions:  Please stop by the lab on the basement level of the building for your blood work. Your results will be released to [facility] (or called to you) after review, usually within 72hours after test completion. If any changes need to be made, you will be notified at that same time.  Referrals have been made during this visit. You should expect to hear back from our schedulers in about 7-10 days in regards to establishing an appointment with the specialists we discussed.   If your symptoms worsen or fail to improve, please contact our office for further instruction, or in case of emergency go directly to the emergency room at the closest medical facility.   Please continue to take your medication as prescribed.  Type 2 Diabetes Mellitus Type 2 diabetes mellitus, often simply referred to as type 2 diabetes, is a long-lasting (chronic) disease. In type 2 diabetes, the pancreas does not make enough insulin (a hormone), the cells are less responsive to the insulin that is made (insulin resistance), or both. Normally, insulin moves sugars from food into the tissue cells. The tissue cells use the sugars for energy. The lack of insulin or the lack of normal response to insulin causes excess sugars to build up in the blood instead of going into the tissue cells. As a result, high blood sugar (hyperglycemia) develops. The effect of high sugar (glucose) levels can cause many complications. Type 2 diabetes was also previously called adult-onset diabetes, but it can occur at any age.  RISK FACTORS  A person is predisposed to developing type 2 diabetes if someone in the family has the disease and also has one or more of the following primary risk factors:  Overweight.  An inactive lifestyle.  A history of consistently eating high-calorie foods. Maintaining a normal weight and regular physical activity can reduce the chance of developing type 2  diabetes. SYMPTOMS  A person with type 2 diabetes may not show symptoms initially. The symptoms of type 2 diabetes appear slowly. The symptoms include:  Increased thirst (polydipsia).  Increased urination (polyuria).  Increased urination during the night (nocturia).  Weight loss. This weight loss may be rapid.  Frequent, recurring infections.  Tiredness (fatigue).  Weakness.  Vision changes, such as blurred vision.  Fruity smell to your breath.  Abdominal pain.  Nausea or vomiting.  Cuts or bruises which are slow to heal.  Tingling or numbness in the hands or feet. DIAGNOSIS Type 2 diabetes is frequently not diagnosed until complications of diabetes are present. Type 2 diabetes is diagnosed when symptoms or complications are present and when blood glucose levels are increased. Your blood glucose level may be checked by one or more of the following blood tests:  A fasting blood glucose test. You will not be allowed to eat for at least 8 hours before a blood sample is taken.  A random blood glucose test. Your blood glucose is checked at any time of the day regardless of when you ate.  A hemoglobin A1c blood glucose test. A hemoglobin A1c test provides information about blood glucose control over the previous 3 months.  An oral glucose tolerance test (OGTT). Your blood glucose is measured after you have not eaten (fasted) for 2 hours and then after you drink a glucose-containing beverage. TREATMENT   You may need to take insulin or diabetes medicine daily to keep blood glucose levels in the desired range.  If you use insulin, you may need  to adjust the dosage depending on the carbohydrates that you eat with each meal or snack. The treatment goal is to maintain the before meal blood sugar (preprandial glucose) level at 70-130 mg/dL. HOME CARE INSTRUCTIONS   Have your hemoglobin A1c level checked twice a year.  Perform daily blood glucose monitoring as directed by your  health care provider.  Monitor urine ketones when you are ill and as directed by your health care provider.  Take your diabetes medicine or insulin as directed by your health care provider to maintain your blood glucose levels in the desired range.  Never run out of diabetes medicine or insulin. It is needed every day.  If you are using insulin, you may need to adjust the amount of insulin given based on your intake of carbohydrates. Carbohydrates can raise blood glucose levels but need to be included in your diet. Carbohydrates provide vitamins, minerals, and fiber which are an essential part of a healthy diet. Carbohydrates are found in fruits, vegetables, whole grains, dairy products, legumes, and foods containing added sugars.  Eat healthy foods. You should make an appointment to see a registered dietitian to help you create an eating plan that is right for you.  Lose weight if you are overweight.  Carry a medical alert card or wear your medical alert jewelry.  Carry a 15-gram carbohydrate snack with you at all times to treat low blood glucose (hypoglycemia). Some examples of 15-gram carbohydrate snacks include:  Glucose tablets, 3 or 4.  Glucose gel, 15-gram tube.  Raisins, 2 tablespoons (24 grams).  Jelly beans, 6.  Animal crackers, 8.  Regular pop, 4 ounces (120 mL).  Gummy treats, 9.  Recognize hypoglycemia. Hypoglycemia occurs with blood glucose levels of 70 mg/dL and below. The risk for hypoglycemia increases when fasting or skipping meals, during or after intense exercise, and during sleep. Hypoglycemia symptoms can include:  Tremors or shakes.  Decreased ability to concentrate.  Sweating.  Increased heart rate.  Headache.  Dry mouth.  Hunger.  Irritability.  Anxiety.  Restless sleep.  Altered speech or coordination.  Confusion.  Treat hypoglycemia promptly. If you are alert and able to safely swallow, follow the 15:15 rule:  Take 15-20 grams of  rapid-acting glucose or carbohydrate. Rapid-acting options include glucose gel, glucose tablets, or 4 ounces (120 mL) of fruit juice, regular soda, or low-fat milk.  Check your blood glucose level 15 minutes after taking the glucose.  Take 15-20 grams more of glucose if the repeat blood glucose level is still 70 mg/dL or below.  Eat a meal or snack within 1 hour once blood glucose levels return to normal.  Be alert to feeling very thirsty and urinating more frequently than usual, which are early signs of hyperglycemia. An early awareness of hyperglycemia allows for prompt treatment. Treat hyperglycemia as directed by your health care provider.  Engage in at least 150 minutes of moderate-intensity physical activity a week, spread over at least 3 days of the week or as directed by your health care provider. In addition, you should engage in resistance exercise at least 2 times a week or as directed by your health care provider. Try to spend no more than 90 minutes at one time inactive.  Adjust your medicine and food intake as needed if you start a new exercise or sport.  Follow your sick-day plan anytime you are unable to eat or drink as usual.  Do not use any tobacco products including cigarettes, chewing tobacco, or electronic cigarettes.  If you need help quitting, ask your health care provider.  Limit alcohol intake to no more than 1 drink per day for nonpregnant women and 2 drinks per day for men. You should drink alcohol only when you are also eating food. Talk with your health care provider whether alcohol is safe for you. Tell your health care provider if you drink alcohol several times a week.  Keep all follow-up visits as directed by your health care provider. This is important.  Schedule an eye exam soon after the diagnosis of type 2 diabetes and then annually.  Perform daily skin and foot care. Examine your skin and feet daily for cuts, bruises, redness, nail problems, bleeding,  blisters, or sores. A foot exam by a health care provider should be done annually.  Brush your teeth and gums at least twice a day and floss at least once a day. Follow up with your dentist regularly.  Share your diabetes management plan with your workplace or school.  Stay up-to-date with immunizations. It is recommended that people with diabetes who are over 11 years old get the pneumonia vaccine. In some cases, two separate shots may be given. Ask your health care provider if your pneumonia vaccination is up-to-date.  Learn to manage stress.  Obtain ongoing diabetes education and support as needed.  Participate in or seek rehabilitation as needed to maintain or improve independence and quality of life. Request a physical or occupational therapy referral if you are having foot or hand numbness, or difficulties with grooming, dressing, eating, or physical activity. SEEK MEDICAL CARE IF:   You are unable to eat food or drink fluids for more than 6 hours.  You have nausea and vomiting for more than 6 hours.  Your blood glucose level is over 240 mg/dL.  There is a change in mental status.  You develop an additional serious illness.  You have diarrhea for more than 6 hours.  You have been sick or have had a fever for a couple of days and are not getting better.  You have pain during any physical activity.  SEEK IMMEDIATE MEDICAL CARE IF:  You have difficulty breathing.  You have moderate to large ketone levels. MAKE SURE YOU:  Understand these instructions.  Will watch your condition.  Will get help right away if you are not doing well or get worse. Document Released: 01/01/2005 Document Revised: 05/18/2013 Document Reviewed: 07/31/2011 [facility] Patient Information 2015 [place], [place]. This information is not intended to replace advice given to you by your health care provider. Make sure you discuss any questions you have with your health care provider.

## 2013-12-03 NOTE — Assessment & Plan Note (Addendum)
Blood sugar log reviewed and reveals near stabilized blood sugars. Continue to Metformin as prescribed. Continue to check blood sugars daily in the morning and evening. Obtain A1c and BMET. Diabetic foot exam completed. Refer to opthalmology for diabetic eye exam. Follow up pending lab results.

## 2013-12-03 NOTE — Progress Notes (Signed)
   Subjective:    Patient ID: Angel Benson, male    DOB: 1950-11-09, 63 y.o.   MRN: [id number]  Chief Complaint  Patient presents with  . Establish Care    had heart surgery in august and feels like he needs pcp    HPI:  Angel Benson is a 63 y.o. male who presents today to establish care. Being followed by Dr. Debara Pickett of cardiology.   1) Diabetes -  Recently newly diagnosed in July. Currently maintained on Metformin. Denies any adverse effects from the metformin. Denies changes in vision.   Lab Results  Component Value Date   HGBA1C 7.4* 08/24/2013   2) Hypertension - currently maintained on lisinopril and metoprolol. Denies nagging cough. Denies chest pain/discomfort, edema.  BP Readings from Last 3 Encounters:  12/03/13 124/78  11/12/13 108/71  10/29/13 106/64    3) Hyperlipidemia - maintained on Zocor. Denies any muscle pain.  Lab Results  Component Value Date   CHOL 208* 08/24/2013   HDL 41 08/24/2013   LDLCALC 152* 08/24/2013   TRIG 73 08/24/2013   CHOLHDL 5.1 08/24/2013   No Known Allergies  Current Outpatient Prescriptions on File Prior to Visit  Medication Sig Dispense Refill  . aspirin 81 MG tablet Take 81 mg by mouth daily.    Marland Kitchen lisinopril (PRINIVIL,ZESTRIL) 2.5 MG tablet Take 1 tablet (2.5 mg total) by mouth daily. 30 tablet 6  . metFORMIN (GLUCOPHAGE) 500 MG tablet Take 1 tablet (500 mg total) by mouth 2 (two) times daily with a meal. 60 tablet 1  . metoprolol succinate (TOPROL-XL) 50 MG 24 hr tablet Take 50 mg by mouth daily. Take with or immediately following a meal.    . omeprazole (PRILOSEC) 20 MG capsule Take 20 mg by mouth daily.    . simvastatin (ZOCOR) 20 MG tablet Take 20 mg by mouth daily.     No current facility-administered medications on file prior to visit.   Past Medical History  Diagnosis Date  . GERD (gastroesophageal reflux disease)   . Hypercholesteremia   . CAD, multiple vessel     s/p CABG  . Chicken pox   . Diabetes  mellitus without complication     Type 2  . Allergy   . Hypertension   . Urinary tract bacterial infections      Review of Systems    See HPI  Objective:    BP 124/78 mmHg  Pulse 77  Temp(Src) 98.1 F (36.7 C) (Oral)  Resp 18  Ht 5\' 9"  (1.753 m)  Wt 171 lb (77.565 kg)  BMI 25.24 kg/m2  SpO2 98% Nursing note and vital signs reviewed.  Physical Exam  Constitutional: He is oriented to person, place, and time. He appears well-developed and well-nourished. No distress.  Cardiovascular: Normal rate, regular rhythm, normal heart sounds and intact distal pulses.   Pulmonary/Chest: Effort normal and breath sounds normal.  Musculoskeletal:  Diabetic Foot Exam - Bilateral feet are free from skin breakdown or wounds. Pulses intact. Sensation intact to monofilament and appropriate.   Neurological: He is alert and oriented to person, place, and time.  Skin: Skin is warm and dry.  Psychiatric: He has a normal mood and affect. His behavior is normal. Judgment and thought content normal.       Assessment & Plan:

## 2013-12-03 NOTE — Progress Notes (Signed)
Pre visit review using our clinic review tool, if applicable. No additional management support is needed unless otherwise documented below in the visit note. 

## 2013-12-03 NOTE — Telephone Encounter (Signed)
Please call the patient and inform him that his kidney function is normal and that his A1c is 6.2. This indicates that his diabetes is controlled at this point and no adjustments are necessary. He may also cut back on checking his sugar every other day in the morning and evening unless he feels the need to test more often.

## 2013-12-03 NOTE — Assessment & Plan Note (Signed)
Stable. Continue simvastatin at current dosage. Recheck lipid profile at physical when fasting.

## 2013-12-04 ENCOUNTER — Encounter (HOSPITAL_COMMUNITY)
Admission: RE | Admit: 2013-12-04 | Discharge: 2013-12-04 | Disposition: A | Discharge status: Home / Self Care | Payer: 59 | Source: Ambulatory Visit | Attending: Internal Medicine | Admitting: Internal Medicine

## 2013-12-04 DIAGNOSIS — I252 Old myocardial infarction: Secondary | ICD-10-CM | POA: Diagnosis not present

## 2013-12-04 NOTE — Telephone Encounter (Signed)
Let pt know that his labs were good an A1c was 6.2. Pt understood that his diabetes was well controlled and there are no adjustments that need to be made.

## 2013-12-04 NOTE — Telephone Encounter (Signed)
Called pt and left a message for him to call me back.

## 2013-12-07 ENCOUNTER — Encounter (HOSPITAL_COMMUNITY)
Admission: RE | Admit: 2013-12-07 | Discharge: 2013-12-07 | Disposition: A | Discharge status: Home / Self Care | Payer: 59 | Source: Ambulatory Visit | Attending: Internal Medicine | Admitting: Internal Medicine

## 2013-12-07 DIAGNOSIS — I252 Old myocardial infarction: Secondary | ICD-10-CM | POA: Diagnosis not present

## 2013-12-09 ENCOUNTER — Encounter (HOSPITAL_COMMUNITY)
Admission: RE | Admit: 2013-12-09 | Discharge: 2013-12-09 | Disposition: A | Discharge status: Home / Self Care | Payer: 59 | Source: Ambulatory Visit | Attending: Internal Medicine | Admitting: Internal Medicine

## 2013-12-09 DIAGNOSIS — I252 Old myocardial infarction: Secondary | ICD-10-CM | POA: Diagnosis not present

## 2013-12-14 ENCOUNTER — Encounter (HOSPITAL_COMMUNITY)
Admission: RE | Admit: 2013-12-14 | Discharge: 2013-12-14 | Disposition: A | Discharge status: Home / Self Care | Payer: 59 | Source: Ambulatory Visit | Attending: Internal Medicine | Admitting: Internal Medicine

## 2013-12-14 DIAGNOSIS — I252 Old myocardial infarction: Secondary | ICD-10-CM | POA: Diagnosis not present

## 2013-12-14 NOTE — Progress Notes (Signed)
Angel Benson 63 y.o. male Nutrition Note Spoke with pt.  Nutrition Plan and Nutrition Survey goals reviewed with pt. Pt is following Step 2 of the Therapeutic Lifestyle Changes diet. Pt is diabetic. Last A1c indicates blood glucose well-controlled. Pt checks his fasting CBG's several mornings/week. Pt reports CBG's 96-110 mg/dL. Pt continues holding one Metformin/d and is taking his pm Metformin. Pt wants to try holding his evening Metformin "sometime soon." This writer went over Diabetes Education test results. Pt expressed understanding of the information reviewed. Pt aware of nutrition education classes offered. Lab Results  Component Value Date   HGBA1C 6.2 12/03/2013   Nutrition Diagnosis ? Food-and nutrition-related knowledge deficit related to lack of exposure to information as related to diagnosis of: ? CVD ? DM (A1c 6.2)  Nutrition RX/ Estimated Daily Nutrition Needs for: wt maintenance 2200-2600 Kcal, 70-85 gm fat, 14-17 gm sat fat, 2.2-2.5 gm trans-fat, <1500 mg sodium, 325 gm CHO   Nutrition Intervention ? Pt's individual nutrition plan reviewed with pt. ? Benefits of adopting Therapeutic Lifestyle Changes discussed when Medficts reviewed. ? Pt to attend the Portion Distortion class ? Pt to attend the  ? Nutrition I class - met; 11/17/13                     ? Nutrition II class - met; 10/27/13        ? Diabetes Blitz class - met; 11/03/13       ? Diabetes Q & A class - met; 11/27/13 ? Continue client-centered nutrition education by RD, as part of interdisciplinary care. Goal(s) ? Pt to describe the benefit of including fruits, vegetables, whole grains, and low-fat dairy products in a heart healthy meal plan. ? Use pre-meal and post-meal CBG's and A1c to determine whether adjustments in food/meal planning will be beneficial or if any meds need to be combined with nutrition therapy. Monitor and Evaluate progress toward nutrition goal with team. Nutrition Risk:  Change to  Moderate Angel Benson, M.Ed, RD, LDN, CDE 12/14/2013 8:05 AM

## 2013-12-16 ENCOUNTER — Encounter (HOSPITAL_COMMUNITY)
Admission: RE | Admit: 2013-12-16 | Discharge: 2013-12-16 | Disposition: A | Discharge status: Home / Self Care | Payer: 59 | Source: Ambulatory Visit | Attending: Internal Medicine | Admitting: Internal Medicine

## 2013-12-16 DIAGNOSIS — I252 Old myocardial infarction: Secondary | ICD-10-CM | POA: Insufficient documentation

## 2013-12-16 DIAGNOSIS — E119 Type 2 diabetes mellitus without complications: Secondary | ICD-10-CM | POA: Diagnosis not present

## 2013-12-16 DIAGNOSIS — I6529 Occlusion and stenosis of unspecified carotid artery: Secondary | ICD-10-CM | POA: Insufficient documentation

## 2013-12-16 DIAGNOSIS — E785 Hyperlipidemia, unspecified: Secondary | ICD-10-CM | POA: Diagnosis not present

## 2013-12-16 DIAGNOSIS — Z7982 Long term (current) use of aspirin: Secondary | ICD-10-CM | POA: Insufficient documentation

## 2013-12-16 DIAGNOSIS — K219 Gastro-esophageal reflux disease without esophagitis: Secondary | ICD-10-CM | POA: Insufficient documentation

## 2013-12-16 DIAGNOSIS — E780 Pure hypercholesterolemia: Secondary | ICD-10-CM | POA: Insufficient documentation

## 2013-12-16 DIAGNOSIS — I2510 Atherosclerotic heart disease of native coronary artery without angina pectoris: Secondary | ICD-10-CM | POA: Insufficient documentation

## 2013-12-16 DIAGNOSIS — Z833 Family history of diabetes mellitus: Secondary | ICD-10-CM | POA: Diagnosis not present

## 2013-12-16 DIAGNOSIS — I10 Essential (primary) hypertension: Secondary | ICD-10-CM | POA: Insufficient documentation

## 2013-12-16 DIAGNOSIS — Z79899 Other long term (current) drug therapy: Secondary | ICD-10-CM | POA: Insufficient documentation

## 2013-12-16 DIAGNOSIS — Z951 Presence of aortocoronary bypass graft: Secondary | ICD-10-CM | POA: Diagnosis not present

## 2013-12-16 DIAGNOSIS — Z8249 Family history of ischemic heart disease and other diseases of the circulatory system: Secondary | ICD-10-CM | POA: Diagnosis not present

## 2013-12-16 DIAGNOSIS — Z5189 Encounter for other specified aftercare: Secondary | ICD-10-CM | POA: Diagnosis present

## 2013-12-18 ENCOUNTER — Encounter (HOSPITAL_COMMUNITY)
Admission: RE | Admit: 2013-12-18 | Discharge: 2013-12-18 | Disposition: A | Discharge status: Home / Self Care | Payer: 59 | Source: Ambulatory Visit | Attending: Internal Medicine | Admitting: Internal Medicine

## 2013-12-18 DIAGNOSIS — I252 Old myocardial infarction: Secondary | ICD-10-CM | POA: Diagnosis not present

## 2013-12-21 ENCOUNTER — Encounter (HOSPITAL_COMMUNITY)
Admission: RE | Admit: 2013-12-21 | Discharge: 2013-12-21 | Disposition: A | Discharge status: Home / Self Care | Payer: 59 | Source: Ambulatory Visit | Attending: Internal Medicine | Admitting: Internal Medicine

## 2013-12-21 DIAGNOSIS — I252 Old myocardial infarction: Secondary | ICD-10-CM | POA: Diagnosis not present

## 2013-12-23 ENCOUNTER — Encounter (HOSPITAL_COMMUNITY): Payer: 59

## 2013-12-25 ENCOUNTER — Encounter (HOSPITAL_COMMUNITY)
Admission: RE | Admit: 2013-12-25 | Discharge: 2013-12-25 | Disposition: A | Discharge status: Home / Self Care | Payer: 59 | Source: Ambulatory Visit | Attending: Internal Medicine | Admitting: Internal Medicine

## 2013-12-25 DIAGNOSIS — I252 Old myocardial infarction: Secondary | ICD-10-CM | POA: Diagnosis not present

## 2013-12-28 ENCOUNTER — Encounter (HOSPITAL_COMMUNITY)
Admission: RE | Admit: 2013-12-28 | Discharge: 2013-12-28 | Disposition: A | Discharge status: Home / Self Care | Payer: 59 | Source: Ambulatory Visit | Attending: Internal Medicine | Admitting: Internal Medicine

## 2013-12-28 DIAGNOSIS — I252 Old myocardial infarction: Secondary | ICD-10-CM | POA: Diagnosis not present

## 2013-12-30 ENCOUNTER — Encounter (HOSPITAL_COMMUNITY)
Admission: RE | Admit: 2013-12-30 | Discharge: 2013-12-30 | Disposition: A | Discharge status: Home / Self Care | Payer: 59 | Source: Ambulatory Visit | Attending: Internal Medicine | Admitting: Internal Medicine

## 2013-12-30 DIAGNOSIS — I252 Old myocardial infarction: Secondary | ICD-10-CM | POA: Diagnosis not present

## 2014-01-01 ENCOUNTER — Encounter (HOSPITAL_COMMUNITY)
Admission: RE | Admit: 2014-01-01 | Discharge: 2014-01-01 | Disposition: A | Discharge status: Home / Self Care | Payer: 59 | Source: Ambulatory Visit | Attending: Internal Medicine | Admitting: Internal Medicine

## 2014-01-01 DIAGNOSIS — I252 Old myocardial infarction: Secondary | ICD-10-CM | POA: Diagnosis not present

## 2014-01-01 LAB — GLUCOSE, CAPILLARY: Glucose-Capillary: 109 mg/dL — ABNORMAL HIGH (ref 70–99)

## 2014-01-04 ENCOUNTER — Encounter (HOSPITAL_COMMUNITY)
Admission: RE | Admit: 2014-01-04 | Discharge: 2014-01-04 | Disposition: A | Discharge status: Home / Self Care | Payer: 59 | Source: Ambulatory Visit | Attending: Internal Medicine | Admitting: Internal Medicine

## 2014-01-04 DIAGNOSIS — I252 Old myocardial infarction: Secondary | ICD-10-CM | POA: Diagnosis not present

## 2014-01-06 ENCOUNTER — Encounter (HOSPITAL_COMMUNITY)
Admission: RE | Admit: 2014-01-06 | Discharge: 2014-01-06 | Disposition: A | Discharge status: Home / Self Care | Payer: 59 | Source: Ambulatory Visit | Attending: Internal Medicine | Admitting: Internal Medicine

## 2014-01-06 DIAGNOSIS — I252 Old myocardial infarction: Secondary | ICD-10-CM | POA: Diagnosis not present

## 2014-01-11 ENCOUNTER — Encounter (HOSPITAL_COMMUNITY)
Admission: RE | Admit: 2014-01-11 | Discharge: 2014-01-11 | Disposition: A | Discharge status: Home / Self Care | Payer: 59 | Source: Ambulatory Visit | Attending: Internal Medicine | Admitting: Internal Medicine

## 2014-01-11 DIAGNOSIS — I252 Old myocardial infarction: Secondary | ICD-10-CM | POA: Diagnosis not present

## 2014-01-11 LAB — GLUCOSE, CAPILLARY: GLUCOSE-CAPILLARY: 96 mg/dL (ref 70–99)

## 2014-01-13 ENCOUNTER — Encounter (HOSPITAL_COMMUNITY)
Admission: RE | Admit: 2014-01-13 | Discharge: 2014-01-13 | Disposition: A | Discharge status: Home / Self Care | Payer: 59 | Source: Ambulatory Visit | Attending: Internal Medicine | Admitting: Internal Medicine

## 2014-01-13 DIAGNOSIS — I252 Old myocardial infarction: Secondary | ICD-10-CM | POA: Diagnosis not present

## 2014-01-13 NOTE — Progress Notes (Signed)
Pt  Noticed his fasting blood glucose at home ranging in the 90's.  Pt omitted his metformin evening dose. AM fasting blood glucose 114.  Pt plans to hold his evening dose and continue to monitor his blood glucose at home.  Pt has upcoming appt in January for his annual physical.  Pt plans to talk to his primary MD then. Cherre Huger, BSN

## 2014-01-18 ENCOUNTER — Encounter (HOSPITAL_COMMUNITY)
Admission: RE | Admit: 2014-01-18 | Discharge: 2014-01-18 | Disposition: A | Discharge status: Home / Self Care | Payer: 59 | Source: Ambulatory Visit | Attending: Internal Medicine | Admitting: Internal Medicine

## 2014-01-18 DIAGNOSIS — I252 Old myocardial infarction: Secondary | ICD-10-CM | POA: Insufficient documentation

## 2014-01-18 DIAGNOSIS — Z7982 Long term (current) use of aspirin: Secondary | ICD-10-CM | POA: Diagnosis not present

## 2014-01-18 DIAGNOSIS — Z79899 Other long term (current) drug therapy: Secondary | ICD-10-CM | POA: Diagnosis not present

## 2014-01-18 DIAGNOSIS — Z5189 Encounter for other specified aftercare: Secondary | ICD-10-CM | POA: Diagnosis present

## 2014-01-18 DIAGNOSIS — Z833 Family history of diabetes mellitus: Secondary | ICD-10-CM | POA: Insufficient documentation

## 2014-01-18 DIAGNOSIS — E785 Hyperlipidemia, unspecified: Secondary | ICD-10-CM | POA: Insufficient documentation

## 2014-01-18 DIAGNOSIS — I6529 Occlusion and stenosis of unspecified carotid artery: Secondary | ICD-10-CM | POA: Diagnosis not present

## 2014-01-18 DIAGNOSIS — I10 Essential (primary) hypertension: Secondary | ICD-10-CM | POA: Insufficient documentation

## 2014-01-18 DIAGNOSIS — Z951 Presence of aortocoronary bypass graft: Secondary | ICD-10-CM | POA: Diagnosis not present

## 2014-01-18 DIAGNOSIS — I2510 Atherosclerotic heart disease of native coronary artery without angina pectoris: Secondary | ICD-10-CM | POA: Insufficient documentation

## 2014-01-18 DIAGNOSIS — E780 Pure hypercholesterolemia: Secondary | ICD-10-CM | POA: Diagnosis not present

## 2014-01-18 DIAGNOSIS — Z8249 Family history of ischemic heart disease and other diseases of the circulatory system: Secondary | ICD-10-CM | POA: Diagnosis not present

## 2014-01-18 DIAGNOSIS — E119 Type 2 diabetes mellitus without complications: Secondary | ICD-10-CM | POA: Insufficient documentation

## 2014-01-18 DIAGNOSIS — K219 Gastro-esophageal reflux disease without esophagitis: Secondary | ICD-10-CM | POA: Diagnosis not present

## 2014-01-20 ENCOUNTER — Encounter (HOSPITAL_COMMUNITY)
Admission: RE | Admit: 2014-01-20 | Discharge: 2014-01-20 | Disposition: A | Discharge status: Home / Self Care | Payer: 59 | Source: Ambulatory Visit | Attending: Internal Medicine | Admitting: Internal Medicine

## 2014-01-20 DIAGNOSIS — I252 Old myocardial infarction: Secondary | ICD-10-CM | POA: Diagnosis not present

## 2014-01-22 ENCOUNTER — Encounter (HOSPITAL_COMMUNITY)
Admission: RE | Admit: 2014-01-22 | Discharge: 2014-01-22 | Disposition: A | Discharge status: Home / Self Care | Payer: 59 | Source: Ambulatory Visit | Attending: Internal Medicine | Admitting: Internal Medicine

## 2014-01-22 ENCOUNTER — Telehealth (HOSPITAL_COMMUNITY): Payer: Self-pay | Admitting: *Deleted

## 2014-01-22 DIAGNOSIS — I252 Old myocardial infarction: Secondary | ICD-10-CM | POA: Diagnosis not present

## 2014-01-22 NOTE — Telephone Encounter (Signed)
-----   Message from Pixie Casino, MD sent at 01/22/2014 11:46 AM EST ----- Regarding: RE: [facility] for light jogging Yes, light jogging is fine.  Dr. Lemmie Evens  ----- Message -----    From: Rowe Pavy, RN    Sent: 01/22/2014  10:28 AM      To: Pixie Casino, MD Subject: [facility] for light jogging                            Pt is s/p NSTEMI - 08/24/13 and CABG x 5 08/26/2013.  Pt is nearing the end of his participation in cardiac rehab.  Pt has expressed interest in resuming light jogging as once was able to prior to his cardiac event.  Pt vital signs remain within normal limits both at rest and on exertion and no ectopy noted on monitor.  May pt resume light jogging on treadmill here in cardiac rehab? Ideally would like to monitor here before pt resumes jogging on his own.  Thanks for the advisement  [employer] RN

## 2014-01-22 NOTE — Progress Notes (Signed)
Pt will graduate with the completion of 36 exercise sessions in the Cardiac Rehab phase II program.  Pt maintained excellent attendance to exercise as well as the educational offerings.  Pt progressed in the program is measured by his increase in his MET level from 3.5 to 4.9.  Pt demonstrates appropriate knowledge of heart healthy lifestyle with the increase in his score for post knowledge test. Pt plans to continue his home exercise with walking.  Pt feels he has met his personal goal for weight gain/maintenance.  Pt desired to gain the weight he loss during his recovery from his heart surgery.  Although he did not have the significant gain he hoped for he was able to maintain his weight and not lose anymore.  Pt reported during orientation that he desired to run.  Pt use to run in the [employer] but did not find any real pleasure with running.  When questioned regarding this goal pt responded that he really did not have a real desire to return to running.  He simply stated this because this was was an activity he did in the past.  Pt plans to to some light jogging in spurts with his walking.  Pt feels confident in this.  Pt long term goal is to feel good and decrease sob.  Pt remarks that he has achieved this goal and denies any complaints of sob or low energy. Pt able to return to work with ease.  Psychosocial:  Pt demonstrates positive and healthy coping skills.  Pt has support from his extended family and friends. Pt denies any depression.   It is indeed a pleasure to work with this pt in cardiac rehab. Cherre Huger, BSN

## 2014-01-25 ENCOUNTER — Encounter (HOSPITAL_COMMUNITY)
Admission: RE | Admit: 2014-01-25 | Discharge: 2014-01-25 | Disposition: A | Discharge status: Home / Self Care | Payer: 59 | Source: Ambulatory Visit | Attending: Internal Medicine | Admitting: Internal Medicine

## 2014-01-25 DIAGNOSIS — I252 Old myocardial infarction: Secondary | ICD-10-CM | POA: Diagnosis not present

## 2014-01-27 ENCOUNTER — Encounter (HOSPITAL_COMMUNITY): Payer: 59

## 2014-01-29 ENCOUNTER — Encounter (HOSPITAL_COMMUNITY): Payer: 59

## 2014-02-01 ENCOUNTER — Ambulatory Visit (INDEPENDENT_AMBULATORY_CARE_PROVIDER_SITE_OTHER): Payer: 59 | Admitting: Family

## 2014-02-01 ENCOUNTER — Telehealth: Payer: Self-pay | Admitting: Family

## 2014-02-01 ENCOUNTER — Encounter (HOSPITAL_COMMUNITY): Payer: 59

## 2014-02-01 ENCOUNTER — Encounter: Payer: Self-pay | Admitting: Family

## 2014-02-01 ENCOUNTER — Other Ambulatory Visit (INDEPENDENT_AMBULATORY_CARE_PROVIDER_SITE_OTHER): Payer: 59

## 2014-02-01 VITALS — BP 110/74 | HR 81 | Temp 97.8°F | Resp 18 | Ht 69.0 in | Wt 173.8 lb

## 2014-02-01 DIAGNOSIS — Z9189 Other specified personal risk factors, not elsewhere classified: Secondary | ICD-10-CM

## 2014-02-01 DIAGNOSIS — E119 Type 2 diabetes mellitus without complications: Secondary | ICD-10-CM

## 2014-02-01 DIAGNOSIS — E785 Hyperlipidemia, unspecified: Secondary | ICD-10-CM

## 2014-02-01 DIAGNOSIS — Z0000 Encounter for general adult medical examination without abnormal findings: Secondary | ICD-10-CM

## 2014-02-01 DIAGNOSIS — R718 Other abnormality of red blood cells: Secondary | ICD-10-CM

## 2014-02-01 DIAGNOSIS — Z23 Encounter for immunization: Secondary | ICD-10-CM

## 2014-02-01 LAB — BASIC METABOLIC PANEL
BUN: 24 mg/dL — ABNORMAL HIGH (ref 6–23)
CHLORIDE: 104 meq/L (ref 96–112)
CO2: 30 mEq/L (ref 19–32)
Calcium: 9.5 mg/dL (ref 8.4–10.5)
Creatinine, Ser: 1.23 mg/dL (ref 0.40–1.50)
GFR: 76.67 mL/min (ref >60.00)
GLUCOSE: 122 mg/dL — AB (ref 70–99)
Potassium: 4.7 mEq/L (ref 3.5–5.1)
SODIUM: 138 meq/L (ref 135–145)

## 2014-02-01 LAB — HEPATIC FUNCTION PANEL
ALK PHOS: 79 U/L (ref 39–117)
ALT: 16 U/L (ref 0–53)
AST: 16 U/L (ref 0–37)
Albumin: 3.9 g/dL (ref 3.5–5.2)
Bilirubin, Direct: 0.1 mg/dL (ref 0.0–0.3)
Total Bilirubin: 0.4 mg/dL (ref 0.2–1.2)
Total Protein: 7.2 g/dL (ref 6.0–8.3)

## 2014-02-01 LAB — HEMOGLOBIN A1C: HEMOGLOBIN A1C: 7.1 % — AB (ref 4.6–6.5)

## 2014-02-01 LAB — CBC
HCT: 47.7 % (ref 39.0–52.0)
Hemoglobin: 15.1 g/dL (ref 13.0–17.0)
MCHC: 31.7 g/dL (ref 30.0–36.0)
MCV: 82.9 fl (ref 78.0–100.0)
Platelets: 191 10*3/uL (ref 150.0–400.0)
RBC: 5.75 Mil/uL (ref 4.22–5.81)
RDW: 15.9 % — ABNORMAL HIGH (ref 11.5–15.5)
WBC: 10 10*3/uL (ref 4.0–10.5)

## 2014-02-01 MED ORDER — LISINOPRIL 2.5 MG PO TABS: 2.5000 mg | ORAL_TABLET | Freq: Every day | ORAL | Status: DC

## 2014-02-01 MED ORDER — SIMVASTATIN 20 MG PO TABS: 20.0000 mg | ORAL_TABLET | Freq: Every day | ORAL | Status: DC

## 2014-02-01 MED ORDER — METOPROLOL SUCCINATE ER 50 MG PO TB24: 50.0000 mg | ORAL_TABLET | Freq: Every day | ORAL | Status: DC

## 2014-02-01 MED ORDER — OMEPRAZOLE 20 MG PO CPDR: 20.0000 mg | DELAYED_RELEASE_CAPSULE | Freq: Every day | ORAL | Status: DC

## 2014-02-01 NOTE — Progress Notes (Signed)
Subjective:    Patient ID: Angel Benson, male    DOB: [birth year], 64 y.o.   MRN: [id number]  Chief Complaint  Patient presents with  . CPE    Not fasting    HPI:  Angel Benson is a 64 y.o. male who presents today for an annual wellness visit.   1) Health Maintenance - Believes his health to be pretty good.   Diet -  3 meals per day; tries to do well with fruits and vegetables and lean meats; stays away from fried foods.   Exercise - Just completed cardiac rehab and is getting restarted.   2) Preventative Exams / Immunizations:  Dental -- Up to date Vision -- Up to date   Health Maintenance  Topic Date Due  . OPHTHALMOLOGY EXAM  01/18/1962  . URINE MICROALBUMIN  01/18/1962  . TETANUS/TDAP  01/19/1971  . COLONOSCOPY  01/18/2002  . ZOSTAVAX  01/19/2012  . INFLUENZA VACCINE  08/15/2013  . HEMOGLOBIN A1C  06/03/2014  . FOOT EXAM  12/04/2014  . PNEUMOCOCCAL POLYSACCHARIDE VACCINE (2) 08/26/2018  Schedule for colonscopy; Tetanus shot;   Immunization History  Administered Date(s) Administered  . Pneumococcal Polysaccharide-23 08/25/2013    No Known Allergies  Current Outpatient Prescriptions on File Prior to Visit  Medication Sig Dispense Refill  . aspirin 81 MG tablet Take 81 mg by mouth daily.    Marland Kitchen lisinopril (PRINIVIL,ZESTRIL) 2.5 MG tablet Take 1 tablet (2.5 mg total) by mouth daily. 30 tablet 6  . metoprolol succinate (TOPROL-XL) 50 MG 24 hr tablet Take 50 mg by mouth daily. Take with or immediately following a meal.    . omeprazole (PRILOSEC) 20 MG capsule Take 20 mg by mouth daily.    . simvastatin (ZOCOR) 20 MG tablet Take 20 mg by mouth daily.     No current facility-administered medications on file prior to visit.    Past Medical History  Diagnosis Date  . GERD (gastroesophageal reflux disease)   . Hypercholesteremia   . CAD, multiple vessel     s/p CABG  . Chicken pox   . Diabetes mellitus without complication     Type 2  . Allergy   .  Hypertension   . Urinary tract bacterial infections     Past Surgical History  Procedure Laterality Date  . Intraoperative transesophageal echocardiogram N/A 08/26/2013    Procedure: INTRAOPERATIVE TRANSESOPHAGEAL ECHOCARDIOGRAM;  Surgeon: Grace Isaac, MD;  Location: [place] [facility];  Service: Open Heart Surgery;  Laterality: N/A;  . Cardiac catheterization    . Coronary artery bypass graft N/A 08/26/2013    Procedure: CORONARY ARTERY BYPASS GRAFTING (CABG) times five, using left internal mammary artery, right and left greater saphenous vein;  Surgeon: Grace Isaac, MD;  Location: [place];  Service: Open Heart Surgery;  Laterality: N/A;  LIMA-LAD; SEQ SVG-OM1-OM2-OM3; SVG-RCA     Family History  Problem Relation Age of Onset  . Diabetes Mother   . Alzheimer's disease Mother   . Heart disease Father   . Diabetes Father   . Heart disease Brother     History   Social History  . Marital Status: Divorced    Spouse Name: N/A    Number of Children: 1  . Years of Education: 12   Occupational History  . Dock worker    Social History Main Topics  . Smoking status: Never Smoker   . Smokeless tobacco: Never Used  . Alcohol Use: No  . Drug Use: No  . Sexual  Activity: Not on file   Other Topics Concern  . Not on file   Social History Narrative   Born in [place] and raised in [place]. Currently resides in an apartment with his sister. Fun: Basketball   Denies religious beliefs effecting healthcare.     Review of Systems  Constitutional: Denies fever, chills, fatigue, or significant weight gain/loss. HENT: Head: Denies headache or neck pain Ears: Denies changes in hearing, ringing in ears, earache, drainage Nose: Denies discharge, stuffiness, itching, nosebleed, sinus pain Throat: Denies sore throat, hoarseness, dry mouth, sores, thrush Eyes: Denies loss/changes in vision, pain, redness, blurry/double vision, flashing lights Cardiovascular: Denies chest pain/discomfort,  tightness, palpitations, shortness of breath with activity, difficulty lying down, swelling, sudden awakening with shortness of breath Respiratory: Denies shortness of breath, cough, sputum production, wheezing Gastrointestinal: Denies dysphasia, heartburn, change in appetite, nausea, change in bowel habits, rectal bleeding, constipation, diarrhea, yellow skin or eyes Genitourinary: Denies frequency, urgency, burning/pain, blood in urine, incontinence, change in urinary strength. Musculoskeletal: Denies muscle/joint pain, stiffness, back pain, redness or swelling of joints, trauma Skin: Denies rashes, lumps, itching, dryness, color changes, or hair/nail changes Neurological: Denies dizziness, fainting, seizures, weakness, numbness, tingling, tremor Psychiatric - Denies nervousness, stress, depression or memory loss Endocrine: Denies heat or cold intolerance, sweating, frequent urination, excessive thirst, changes in appetite Hematologic: Denies ease of bruising or bleeding     Objective:    BP 110/74 mmHg  Pulse 81  Temp(Src) 97.8 F (36.6 C) (Oral)  Resp 18  Ht 5\' 9"  (1.753 m)  Wt 173 lb 12.8 oz (78.835 kg)  BMI 25.65 kg/m2 Nursing note and vital signs reviewed.  Physical Exam  Constitutional: He is oriented to person, place, and time. He appears well-developed and well-nourished.  HENT:  Head: Normocephalic.  Right Ear: Hearing, tympanic membrane, external ear and ear canal normal.  Left Ear: Hearing, tympanic membrane, external ear and ear canal normal.  Nose: Nose normal.  Mouth/Throat: Uvula is midline, oropharynx is clear and moist and mucous membranes are normal.  Eyes: Conjunctivae and EOM are normal. Pupils are equal, round, and reactive to light.  Neck: Neck supple. No JVD present. No tracheal deviation present. No thyromegaly present.  Cardiovascular: Normal rate, regular rhythm, normal heart sounds and intact distal pulses.   Pulmonary/Chest: Effort normal and breath  sounds normal.  Abdominal: Soft. Bowel sounds are normal. He exhibits no distension and no mass. There is no tenderness. There is no rebound and no guarding.  Musculoskeletal: Normal range of motion. He exhibits no edema or tenderness.  Lymphadenopathy:    He has no cervical adenopathy.  Neurological: He is alert and oriented to person, place, and time. He has normal reflexes. No cranial nerve deficit. He exhibits normal muscle tone. Coordination normal.  Skin: Skin is warm and dry.  Psychiatric: He has a normal mood and affect. His behavior is normal. Judgment and thought content normal.       Assessment & Plan:

## 2014-02-01 NOTE — Assessment & Plan Note (Signed)
1) Anticipatory Guidance: Discussed importance of wearing a seatbelt while driving and not texting while driving; changing batteries in smoke detector at least once annually; wearing suntan lotion when outside; eating a balanced and moderate diet; getting physical activity at least 30 minutes per day.  2) Immunizations / Screenings / Labs:  Tetanus shot given. Declines zostavax. All other immunizations are up to date per recommendations. Due for a colonoscopy - referred to GI. All other screenings are up to date per recommendations. Obtain CBC, BMET, A1c and hepatic panel.   Overall well exam. Discussed risk factors of hypertension which is well controlled; diabetes which is well controlled; and hyperlipidemia which is well controlled at this time. New blood work obtained to up date status. Diabetic foot exam completed. Patient encouraged to increase physical activity up to 30 minutes of walking most days of the week. Follow up prevention exam in 1 year. Follow up chronic conditions in 6 months.

## 2014-02-01 NOTE — Addendum Note (Signed)
Addended by: Delice Bison E on: 02/01/2014 02:20 PM   Modules accepted: Orders

## 2014-02-01 NOTE — Patient Instructions (Signed)
Thank you for choosing [facility].  Summary/Instructions:  Your prescription(s) have been submitted to your pharmacy or been printed and provided for you. Please take as directed and contact our office if you believe you are having problem(s) with the medication(s) or have any questions.  Please stop by the lab on the basement level of the building for your blood work. Your results will be released to [place] (or called to you) after review, usually within 72 hours after test completion. If any changes need to be made, you will be notified at that same time.   Health Maintenance A healthy lifestyle and preventative care can promote health and wellness.  Maintain regular health, dental, and eye exams.  Eat a healthy diet. Foods like vegetables, fruits, whole grains, low-fat dairy products, and lean protein foods contain the nutrients you need and are low in calories. Decrease your intake of foods high in solid fats, added sugars, and salt. Get information about a proper diet from your health care provider, if necessary.  Regular physical exercise is one of the most important things you can do for your health. Most adults should get at least 150 minutes of moderate-intensity exercise (any activity that increases your heart rate and causes you to sweat) each week. In addition, most adults need muscle-strengthening exercises on 2 or more days a week.   Maintain a healthy weight. The body mass index (BMI) is a screening tool to identify possible weight problems. It provides an estimate of body fat based on height and weight. Your health care provider can find your BMI and can help you achieve or maintain a healthy weight. For males 20 years and older:  A BMI below 18.5 is considered underweight.  A BMI of 18.5 to 24.9 is normal.  A BMI of 25 to 29.9 is considered overweight.  A BMI of 30 and above is considered obese.  Maintain normal blood lipids and cholesterol by exercising and  minimizing your intake of saturated fat. Eat a balanced diet with plenty of fruits and vegetables. Blood tests for lipids and cholesterol should begin at age 16 and be repeated every 5 years. If your lipid or cholesterol levels are high, you are over age 63, or you are at high risk for heart disease, you may need your cholesterol levels checked more frequently.Ongoing high lipid and cholesterol levels should be treated with medicines if diet and exercise are not working.  If you smoke, find out from your health care provider how to quit. If you do not use tobacco, do not start.  Lung cancer screening is recommended for adults aged 35-80 years who are at high risk for developing lung cancer because of a history of smoking. A yearly low-dose CT scan of the lungs is recommended for people who have at least a 30-pack-year history of smoking and are current smokers or have quit within the past 15 years. A pack year of smoking is smoking an average of 1 pack of cigarettes a day for 1 year (for example, a 30-pack-year history of smoking could mean smoking 1 pack a day for 30 years or 2 packs a day for 15 years). Yearly screening should continue until the smoker has stopped smoking for at least 15 years. Yearly screening should be stopped for people who develop a health problem that would prevent them from having lung cancer treatment.  If you choose to drink alcohol, do not have more than 2 drinks per day. One drink is considered to be  12 oz (360 mL) of beer, 5 oz (150 mL) of wine, or 1.5 oz (45 mL) of liquor.  Avoid the use of street drugs. Do not share needles with anyone. Ask for help if you need support or instructions about stopping the use of drugs.  High blood pressure causes heart disease and increases the risk of stroke. Blood pressure should be checked at least every 1-2 years. Ongoing high blood pressure should be treated with medicines if weight loss and exercise are not effective.  If you are  83-7 years old, ask your health care provider if you should take aspirin to prevent heart disease.  Diabetes screening involves taking a blood sample to check your fasting blood sugar level. This should be done once every 3 years after age 89 if you are at a normal weight and without risk factors for diabetes. Testing should be considered at a younger age or be carried out more frequently if you are overweight and have at least 1 risk factor for diabetes.  Colorectal cancer can be detected and often prevented. Most routine colorectal cancer screening begins at the age of 60 and continues through age 74. However, your health care provider may recommend screening at an earlier age if you have risk factors for colon cancer. On a yearly basis, your health care provider may provide home test kits to check for hidden blood in the stool. A small camera at the end of a tube may be used to directly examine the colon (sigmoidoscopy or colonoscopy) to detect the earliest forms of colorectal cancer. Talk to your health care provider about this at age 60 when routine screening begins. A direct exam of the colon should be repeated every 5-10 years through age 28, unless early forms of precancerous polyps or small growths are found.  People who are at an increased risk for hepatitis B should be screened for this virus. You are considered at high risk for hepatitis B if:  You were born in a country where hepatitis B occurs often. Talk with your health care provider about which countries are considered high risk.  Your parents were born in a high-risk country and you have not received a shot to protect against hepatitis B (hepatitis B vaccine).  You have HIV or AIDS.  You use needles to inject street drugs.  You live with, or have sex with, someone who has hepatitis B.  You are a man who has sex with other men (MSM).  You get hemodialysis treatment.  You take certain medicines for conditions like cancer, organ  transplantation, and autoimmune conditions.  Hepatitis C blood testing is recommended for all people born from [birth year] through 1963 and any individual with known risk factors for hepatitis C.  Healthy men should no longer receive prostate-specific antigen (PSA) blood tests as part of routine cancer screening. Talk to your health care provider about prostate cancer screening.  Testicular cancer screening is not recommended for adolescents or adult males who have no symptoms. Screening includes self-exam, a health care provider exam, and other screening tests. Consult with your health care provider about any symptoms you have or any concerns you have about testicular cancer.  Practice safe sex. Use condoms and avoid high-risk sexual practices to reduce the spread of sexually transmitted infections (STIs).  You should be screened for STIs, including gonorrhea and chlamydia if:  You are sexually active and are younger than 24 years.  You are older than 24 years, and your health care provider  tells you that you are at risk for this type of infection.  Your sexual activity has changed since you were last screened, and you are at an increased risk for chlamydia or gonorrhea. Ask your health care provider if you are at risk.  If you are at risk of being infected with HIV, it is recommended that you take a prescription medicine daily to prevent HIV infection. This is called pre-exposure prophylaxis (PrEP). You are considered at risk if:  You are a man who has sex with other men (MSM).  You are a heterosexual man who is sexually active with multiple partners.  You take drugs by injection.  You are sexually active with a partner who has HIV.  Talk with your health care provider about whether you are at high risk of being infected with HIV. If you choose to begin PrEP, you should first be tested for HIV. You should then be tested every 3 months for as long as you are taking PrEP.  Use sunscreen. Apply  sunscreen liberally and repeatedly throughout the day. You should seek shade when your shadow is shorter than you. Protect yourself by wearing long sleeves, pants, a wide-brimmed hat, and sunglasses year round whenever you are outdoors.  Tell your health care provider of new moles or changes in moles, especially if there is a change in shape or color. Also, tell your health care provider if a mole is larger than the size of a pencil eraser.  A one-time screening for abdominal aortic aneurysm (AAA) and surgical repair of large AAAs by ultrasound is recommended for men aged 19-75 years who are current or former smokers.  Stay current with your vaccines (immunizations). Document Released: 06/30/2007 Document Revised: 01/06/2013 Document Reviewed: 05/29/2010 [facility] Patient Information 2015 [place], [place]. This information is not intended to replace advice given to you by your health care provider. Make sure you discuss any questions you have with your health care provider.

## 2014-02-01 NOTE — Telephone Encounter (Signed)
Please inform the patient that his lab work looks good, however there are a few areas that need to be addressed. The first is his A1c was 7.1 indicating that his diabetes is slightly elevated. Given his heart history I think it would be in his best interest to be on medication. I would recommend restarting his metformin at this time. His kidney function and liver function both look okay. Finally his red blood cells are small which indicate a potential iron deficiency. Therefore I would like for him to come in and have further lab work to determine is iron status. No appointment is necessary and the labs have already been placed. Please let me know if there are any additional questions.

## 2014-02-01 NOTE — Progress Notes (Signed)
Pre visit review using our clinic review tool, if applicable. No additional management support is needed unless otherwise documented below in the visit note. 

## 2014-02-03 ENCOUNTER — Encounter (HOSPITAL_COMMUNITY): Payer: 59

## 2014-02-03 NOTE — Telephone Encounter (Signed)
Pt aware of lab results and will be coming back to get further testing done when he can.

## 2014-02-05 ENCOUNTER — Encounter (HOSPITAL_COMMUNITY): Payer: 59

## 2014-02-08 ENCOUNTER — Other Ambulatory Visit (INDEPENDENT_AMBULATORY_CARE_PROVIDER_SITE_OTHER): Payer: 59

## 2014-02-08 ENCOUNTER — Telehealth: Payer: Self-pay | Admitting: Family

## 2014-02-08 DIAGNOSIS — R718 Other abnormality of red blood cells: Secondary | ICD-10-CM

## 2014-02-08 LAB — IRON: Iron: 119 ug/dL (ref 42–165)

## 2014-02-08 LAB — IRON AND TIBC
%SAT: 38 % (ref 20–55)
Iron: 120 ug/dL (ref 42–165)
TIBC: 318 ug/dL (ref 215–435)
UIBC: 198 ug/dL (ref 125–400)

## 2014-02-08 LAB — FERRITIN: Ferritin: 23.1 ng/mL (ref 22.0–322.0)

## 2014-02-08 NOTE — Telephone Encounter (Signed)
Please inform the patient that his iron levels have come back within the normal limits. Given that his red blood cells are slightly small and he has increase differential between cell sizes I would recommend he continue an iron supplement for at least the next 3-6 months. We can then follow up to ensure the size of the cells are where they need to be or this is his baseline.

## 2014-02-09 NOTE — Telephone Encounter (Signed)
Pt aware of results. Will send results in the mail.

## 2014-02-16 ENCOUNTER — Encounter: Payer: Self-pay | Admitting: Gastroenterology

## 2014-04-12 ENCOUNTER — Ambulatory Visit: Payer: 59 | Admitting: Gastroenterology

## 2014-05-19 ENCOUNTER — Other Ambulatory Visit: Payer: Self-pay | Admitting: Family

## 2014-08-02 ENCOUNTER — Telehealth: Payer: Self-pay | Admitting: Family

## 2014-08-02 ENCOUNTER — Encounter: Payer: Self-pay | Admitting: Family

## 2014-08-02 ENCOUNTER — Other Ambulatory Visit (INDEPENDENT_AMBULATORY_CARE_PROVIDER_SITE_OTHER): Payer: TRICARE For Life (TFL)

## 2014-08-02 ENCOUNTER — Ambulatory Visit (INDEPENDENT_AMBULATORY_CARE_PROVIDER_SITE_OTHER): Payer: TRICARE For Life (TFL) | Admitting: Family

## 2014-08-02 DIAGNOSIS — E119 Type 2 diabetes mellitus without complications: Secondary | ICD-10-CM

## 2014-08-02 DIAGNOSIS — I10 Essential (primary) hypertension: Secondary | ICD-10-CM

## 2014-08-02 LAB — HEMOGLOBIN A1C: Hgb A1c MFr Bld: 6.2 % (ref 4.6–6.5)

## 2014-08-02 LAB — BASIC METABOLIC PANEL
BUN: 16 mg/dL (ref 6–23)
CO2: 30 meq/L (ref 19–32)
Calcium: 9.4 mg/dL (ref 8.4–10.5)
Chloride: 103 mEq/L (ref 96–112)
Creatinine, Ser: 1.28 mg/dL (ref 0.40–1.50)
GFR: 73.11 mL/min (ref >60.00)
Glucose, Bld: 122 mg/dL — ABNORMAL HIGH (ref 70–99)
Potassium: 4.7 mEq/L (ref 3.5–5.1)
Sodium: 140 mEq/L (ref 135–145)

## 2014-08-02 MED ORDER — OMEPRAZOLE 20 MG PO CPDR: 20.0000 mg | DELAYED_RELEASE_CAPSULE | Freq: Every day | ORAL | Status: DC

## 2014-08-02 MED ORDER — OLMESARTAN MEDOXOMIL 20 MG PO TABS: 20.0000 mg | ORAL_TABLET | Freq: Every day | ORAL | Status: DC

## 2014-08-02 NOTE — Telephone Encounter (Signed)
Please inform patient that his kidney function and electrolytes are within the normal ranges. His A1c was 6.2 which is excellent control of his diabetes. Please have him follow up in 3 months to recheck his A1c after being off of the metformin.

## 2014-08-02 NOTE — Assessment & Plan Note (Signed)
Stable with current regimen of metoprolol and lisinopril. Does report a small cough. Hold lisinopril and start olmesartan. Follow up if cough continues or if blood pressure does not remain controlled with medication changes.

## 2014-08-02 NOTE — Progress Notes (Signed)
Subjective:    Patient ID: Angel Benson, male    DOB: 08-Jul-1950, 64 y.o.   MRN: [id number]  Chief Complaint  Patient presents with  . Follow-up    no problems today - review conditions    HPI:  Angel Benson is a 64 y.o. male with a PMH of CAD, hypertension, hyperlipidemia and Type 2 diabetes who presents today for an office follow up.  1.) Type II Diabetes - Currently maintained on metformin. Indicates that he has been out of the metformin for about 2 weeks, otherwise he takes the medication as prescribed and denies adverse side effects. Blood sugars at home are averaging about 110-120.   Lab Results  Component Value Date   HGBA1C 7.1* 02/01/2014    2.) Hypertension - currently maintained on lisinopril and metoprolol. Takes the medication as prescribed and describes an occasional cough but denies other adverse side effects.   BP Readings from Last 3 Encounters:  08/02/14 120/82  02/01/14 110/74  12/03/13 124/78    No Known Allergies   Current Outpatient Prescriptions on File Prior to Visit  Medication Sig Dispense Refill  . aspirin 81 MG tablet Take 81 mg by mouth daily.    . metoprolol succinate (TOPROL-XL) 50 MG 24 hr tablet Take 1 tablet (50 mg total) by mouth daily. Take with or immediately following a meal. 90 tablet 3  . simvastatin (ZOCOR) 20 MG tablet Take 1 tablet (20 mg total) by mouth daily. 90 tablet 3   No current facility-administered medications on file prior to visit.    Review of Systems  Constitutional: Negative for fever and chills.  Eyes:       Negative for changes in vision.   Respiratory: Positive for choking. Negative for chest tightness and shortness of breath.   Cardiovascular: Negative for chest pain, palpitations and leg swelling.  Neurological: Negative for headaches.      Objective:    BP 120/82 mmHg  Pulse 82  Temp(Src) 98.3 F (36.8 C) (Oral)  Resp 16  Ht 5\' 9"  (1.753 m)  Wt 178 lb (80.74 kg)  BMI 26.27 kg/m2  SpO2  93% Nursing note and vital signs reviewed.  Physical Exam  Constitutional: He is oriented to person, place, and time. He appears well-developed and well-nourished. No distress.  Cardiovascular: Normal rate, regular rhythm, normal heart sounds and intact distal pulses.   Pulmonary/Chest: Effort normal and breath sounds normal.  Neurological: He is alert and oriented to person, place, and time.  Skin: Skin is warm and dry.  Psychiatric: He has a normal mood and affect. His behavior is normal. Judgment and thought content normal.       Assessment & Plan:   Problem List Items Addressed This Visit      Cardiovascular and Mediastinum   Benign essential HTN    Stable with current regimen of metoprolol and lisinopril. Does report a small cough. Hold lisinopril and start olmesartan. Follow up if cough continues or if blood pressure does not remain controlled with medication changes.       Relevant Medications   olmesartan (BENICAR) 20 MG tablet   Other Relevant Orders   Basic metabolic panel     Endocrine   DM2 (diabetes mellitus, type 2)    Stable with current regimen of metformin. Blood sugar average at home is less than goal of 130. Obtain A1c.  Hold metformin for a drug holiday for 3 months. Recheck A1c in 3 months.  Relevant Medications   olmesartan (BENICAR) 20 MG tablet   Other Relevant Orders   Hemoglobin A1c

## 2014-08-02 NOTE — Progress Notes (Signed)
Pre visit review using our clinic review tool, if applicable. No additional management support is needed unless otherwise documented below in the visit note. 

## 2014-08-02 NOTE — Patient Instructions (Signed)
Thank you for choosing [facility].  Summary/Instructions:  Please stop taking the metformin for 3 months and follow up for a repeat test in 3 months.  Please take your blood sugars every other day.  Your prescription(s) have been submitted to your pharmacy or been printed and provided for you. Please take as directed and contact our office if you believe you are having problem(s) with the medication(s) or have any questions.  Please stop by the lab on the basement level of the building for your blood work. Your results will be released to [facility] (or called to you) after review, usually within 72 hours after test completion. If any changes need to be made, you will be notified at that same time.  If your symptoms worsen or fail to improve, please contact our office for further instruction, or in case of emergency go directly to the emergency room at the closest medical facility.

## 2014-08-02 NOTE — Assessment & Plan Note (Signed)
Stable with current regimen of metformin. Blood sugar average at home is less than goal of 130. Obtain A1c.  Hold metformin for a drug holiday for 3 months. Recheck A1c in 3 months.

## 2014-08-03 ENCOUNTER — Other Ambulatory Visit: Payer: Self-pay

## 2014-08-03 MED ORDER — LOSARTAN POTASSIUM 50 MG PO TABS: 50.0000 mg | ORAL_TABLET | Freq: Every day | ORAL | Status: DC

## 2014-08-03 NOTE — Telephone Encounter (Signed)
LVM for pt to call back.

## 2014-08-03 NOTE — Telephone Encounter (Signed)
Pt aware of results 

## 2014-08-11 ENCOUNTER — Other Ambulatory Visit: Payer: Self-pay

## 2014-08-18 ENCOUNTER — Telehealth: Payer: Self-pay

## 2014-08-18 DIAGNOSIS — E785 Hyperlipidemia, unspecified: Secondary | ICD-10-CM

## 2014-08-18 MED ORDER — SIMVASTATIN 20 MG PO TABS: 20.0000 mg | ORAL_TABLET | Freq: Every day | ORAL | Status: DC

## 2014-08-18 MED ORDER — OMEPRAZOLE 20 MG PO CPDR: 20.0000 mg | DELAYED_RELEASE_CAPSULE | Freq: Every day | ORAL | Status: DC

## 2014-08-18 MED ORDER — METOPROLOL SUCCINATE ER 50 MG PO TB24: 50.0000 mg | ORAL_TABLET | Freq: Every day | ORAL | Status: DC

## 2014-08-18 MED ORDER — LOSARTAN POTASSIUM 50 MG PO TABS: 50.0000 mg | ORAL_TABLET | Freq: Every day | ORAL | Status: DC

## 2014-09-27 ENCOUNTER — Encounter: Payer: Self-pay | Admitting: Internal Medicine

## 2014-09-27 ENCOUNTER — Ambulatory Visit (INDEPENDENT_AMBULATORY_CARE_PROVIDER_SITE_OTHER): Payer: TRICARE For Life (TFL) | Admitting: Internal Medicine

## 2014-09-27 VITALS — BP 102/68 | HR 72 | Ht 69.0 in | Wt 182.4 lb

## 2014-09-27 DIAGNOSIS — Z951 Presence of aortocoronary bypass graft: Secondary | ICD-10-CM | POA: Diagnosis not present

## 2014-09-27 DIAGNOSIS — Z79899 Other long term (current) drug therapy: Secondary | ICD-10-CM

## 2014-09-27 DIAGNOSIS — I10 Essential (primary) hypertension: Secondary | ICD-10-CM

## 2014-09-27 DIAGNOSIS — E785 Hyperlipidemia, unspecified: Secondary | ICD-10-CM

## 2014-09-27 DIAGNOSIS — I2510 Atherosclerotic heart disease of native coronary artery without angina pectoris: Secondary | ICD-10-CM

## 2014-09-27 DIAGNOSIS — I255 Ischemic cardiomyopathy: Secondary | ICD-10-CM | POA: Diagnosis not present

## 2014-09-27 NOTE — Patient Instructions (Signed)
Medication Instructions:   CONTINUE SAME MEDICATIONS  Labwork:  FASTING LAB WORK AT YOUR CONVENIENCE AT [facility]  Testing/Procedures:  Your physician has requested that you have an echocardiogram. Echocardiography is a painless test that uses sound waves to create images of your heart. It provides your doctor with information about the size and shape of your heart and how well your heart's chambers and valves are working. This procedure takes approximately one hour. There are no restrictions for this procedure.    Follow-Up:  YEARLY WITH DR. HILTY  Any Other Special Instructions Will Be Listed Below (If Applicable).

## 2014-09-27 NOTE — Progress Notes (Signed)
09/27/2014   PCP: Mauricio Po, FNP   Chief Complaint  Patient presents with  . Annual Exam    pt c/o lightheadedness when he bends over and stands back up sometimes    Primary Cardiologist:Dr. Alma Friendly   HPI: 64 year old AA male with no prior cardiac hx was admitted to [facility] 08/24/13 with chest pain, + NSTEMI.  Cardiac cath with severe triple vessel CAD with total occlusion of the distal RCA, severe proximal LAD stenosis involving a long segment, occluded distal LAD, high grade disease in all three obtuse marginal branches and the mid Circumflex.  Mild segmental LV systolic dysfunction.  He then underwent PROCEDURE: Procedure(s):   CORONARY ARTERY BYPASS GRAFTING (CABG)X5 LIMA-LAD; SEQ SVG-OM1-OM2-OM3; SVG-RCA  INTRAOPERATIVE TRANSESOPHAGEAL ECHOCARDIOGRAM EVH RIGHT THIGH OVH RIGHT LOWER AND LEFT LOWER LEG by Dr. Servando Snare 08/26/13.  Pt did well post op, no atrial fib and progressed with out complications.  Since discharge he has done well no complaint today.  Walking 20 min a day- a mile or so.  occ surgical chest pain.  Eating healthy.    Angel Benson has no complaints today. He follows up and is doing well. He started cardiac rehabilitation and is doing well with his exercise. Denies any chest pain. He was supposed to be on lisinopril however the prescription was never placed. We did go ahead and start low-dose lisinopril and he is taking it without complications.   Angel Benson returns today for follow-up. Overall he is doing very well. He denies any chest pain or worsening shortness of breath. It was noted that during his catheterization EF is reduced  To 45-50%. This is not been reassessed postoperatively. He is also not had a check of his cholesterol in some time.   No Known Allergies  Current Outpatient Prescriptions  Medication Sig Dispense Refill  . aspirin 81 MG tablet Take 81 mg by mouth daily.    Marland Kitchen latanoprost (XALATAN) 0.005 % ophthalmic solution Place 1 drop  into both eyes at bedtime.    Marland Kitchen losartan (COZAAR) 50 MG tablet Take 1 tablet (50 mg total) by mouth daily. Patient needs office visit before any further refills 90 tablet 0  . metoprolol succinate (TOPROL-XL) 50 MG 24 hr tablet Take 1 tablet (50 mg total) by mouth daily. Take with or immediately following a meal.--patient needs office visit before any further refills 90 tablet 0  . omeprazole (PRILOSEC) 20 MG capsule Take 1 capsule (20 mg total) by mouth daily. Patient needs office visit before any further refills 90 capsule 0  . simvastatin (ZOCOR) 20 MG tablet Take 1 tablet (20 mg total) by mouth daily. Patient needs office visit before any further refills 90 tablet 0   No current facility-administered medications for this visit.    Past Medical History  Diagnosis Date  . GERD (gastroesophageal reflux disease)   . Hypercholesteremia   . CAD, multiple vessel     s/p CABG  . Chicken pox   . Diabetes mellitus without complication     Type 2  . Allergy   . Hypertension   . Urinary tract bacterial infections     Past Surgical History  Procedure Laterality Date  . Intraoperative transesophageal echocardiogram N/A 08/26/2013    Procedure: INTRAOPERATIVE TRANSESOPHAGEAL ECHOCARDIOGRAM;  Surgeon: Grace Isaac, MD;  Location: [facility];  Service: Open Heart Surgery;  Laterality: N/A;  . Cardiac catheterization    . Coronary artery bypass graft N/A 08/26/2013  Procedure: CORONARY ARTERY BYPASS GRAFTING (CABG) times five, using left internal mammary artery, right and left greater saphenous vein;  Surgeon: Grace Isaac, MD;  Location: [facility];  Service: Open Heart Surgery;  Laterality: N/A;  LIMA-LAD; SEQ SVG-OM1-OM2-OM3; SVG-RCA     KGY:JEHUDJS:HF colds or fevers, no weight changes Skin:no rashes or ulcers HEENT:no blurred vision, no congestion CV:see HPI PUL:see HPI GI:no diarrhea constipation or melena, no indigestion GU:no hematuria, no dysuria MS:no joint pain, no  claudication Neuro:no syncope, no lightheadedness Endo:+ diabetes stable, no thyroid disease  Wt Readings from Last 3 Encounters:  09/27/14 182 lb 6.4 oz (82.736 kg)  08/02/14 178 lb (80.74 kg)  02/01/14 173 lb 12.8 oz (78.835 kg)    PHYSICAL EXAM BP 102/68 mmHg  Pulse 72  Ht 5\' 9"  (1.753 m)  Wt 182 lb 6.4 oz (82.736 kg)  BMI 26.92 kg/m2  GEN: Awake, NAD HEENT: PERRLA, EOMI  lungs: clear bilaterally Abdomen: soft , nontender positive bowel sounds  cardiovascular: regular rate and rhythm , no murmur  extremities: No edema  neurologic, grossly nonfocal Psych: Pleasant  EKG:  normal sinus rhythm at 72  ASSESSMENT AND PLAN Patient Active Problem List   Diagnosis Date Noted  . Cardiomyopathy, ischemic 09/27/2014  . Routine general medical examination at a health care facility 02/01/2014  . S/P CABG x 5 08/26/2013  . Hyperlipidemia 08/25/2013  . CAD (coronary artery disease), native coronary artery 08/25/2013  . Benign essential HTN 08/25/2013  . DM2 (diabetes mellitus, type 2) 08/25/2013  . NSTEMI (non-ST elevated myocardial infarction) 08/24/2013   PLAN: 1.  Angel Benson is doing well post bypass.  He is due for recheck of his lipid profile I'll go ahead and order an  NMR  Aunt comprehensive metabolic profile. His EF prior to CABG was 45%. Presumably that has improved however would like to recheck that with echocardiogram today. He is on appropriate medications 4 cardiomyopathy including aspirin, losartan and metoprolol. He appears euvolemic on exam. I'll make adjustments to his medications based on findings and his laboratory work. Otherwise plan to see him back annually or sooner as necessary.  Pixie Casino, MD, [facility] Attending Cardiologist [place]

## 2014-10-05 ENCOUNTER — Other Ambulatory Visit: Payer: Self-pay

## 2014-10-05 ENCOUNTER — Ambulatory Visit (HOSPITAL_COMMUNITY): Payer: TRICARE For Life (TFL) | Attending: Cardiovascular Disease

## 2014-10-05 DIAGNOSIS — E785 Hyperlipidemia, unspecified: Secondary | ICD-10-CM | POA: Diagnosis not present

## 2014-10-05 DIAGNOSIS — I351 Nonrheumatic aortic (valve) insufficiency: Secondary | ICD-10-CM | POA: Diagnosis not present

## 2014-10-05 DIAGNOSIS — I255 Ischemic cardiomyopathy: Secondary | ICD-10-CM | POA: Insufficient documentation

## 2014-10-05 DIAGNOSIS — I10 Essential (primary) hypertension: Secondary | ICD-10-CM | POA: Insufficient documentation

## 2014-10-05 DIAGNOSIS — E119 Type 2 diabetes mellitus without complications: Secondary | ICD-10-CM | POA: Insufficient documentation

## 2014-10-17 ENCOUNTER — Other Ambulatory Visit: Payer: Self-pay | Admitting: Family

## 2014-10-18 ENCOUNTER — Other Ambulatory Visit: Payer: Self-pay | Admitting: Family

## 2014-12-10 ENCOUNTER — Other Ambulatory Visit: Payer: Self-pay | Admitting: Family

## 2014-12-27 ENCOUNTER — Other Ambulatory Visit: Payer: Self-pay | Admitting: Family

## 2015-01-03 NOTE — Telephone Encounter (Signed)
Erroneous

## 2015-02-03 ENCOUNTER — Other Ambulatory Visit: Payer: Self-pay

## 2015-02-03 MED ORDER — METOPROLOL SUCCINATE ER 50 MG PO TB24: 50.0000 mg | ORAL_TABLET | Freq: Every day | ORAL | Status: DC

## 2015-02-03 NOTE — Telephone Encounter (Signed)
[facility] sent rx request for Toprol 50 mg tabs.   Pls advise if rx is appropriate. Pended for your review.

## 2015-02-03 NOTE — Telephone Encounter (Signed)
CPX due in Aug [facility]...Angel Benson

## 2015-03-25 ENCOUNTER — Other Ambulatory Visit: Payer: Self-pay | Admitting: Family

## 2015-05-19 ENCOUNTER — Other Ambulatory Visit: Payer: Self-pay

## 2015-05-19 MED ORDER — METOPROLOL SUCCINATE ER 50 MG PO TB24: 50.0000 mg | ORAL_TABLET | Freq: Every day | ORAL | Status: DC

## 2015-06-07 ENCOUNTER — Other Ambulatory Visit: Payer: Self-pay | Admitting: Family

## 2015-08-16 ENCOUNTER — Ambulatory Visit (INDEPENDENT_AMBULATORY_CARE_PROVIDER_SITE_OTHER): Payer: TRICARE For Life (TFL) | Admitting: Family

## 2015-08-16 ENCOUNTER — Encounter: Payer: Self-pay | Admitting: Family

## 2015-08-16 ENCOUNTER — Other Ambulatory Visit (INDEPENDENT_AMBULATORY_CARE_PROVIDER_SITE_OTHER): Payer: TRICARE For Life (TFL)

## 2015-08-16 VITALS — BP 132/72 | HR 81 | Temp 98.0°F | Resp 16 | Ht 69.0 in | Wt 182.0 lb

## 2015-08-16 DIAGNOSIS — Z0000 Encounter for general adult medical examination without abnormal findings: Secondary | ICD-10-CM

## 2015-08-16 DIAGNOSIS — Z125 Encounter for screening for malignant neoplasm of prostate: Secondary | ICD-10-CM | POA: Diagnosis not present

## 2015-08-16 DIAGNOSIS — Z1211 Encounter for screening for malignant neoplasm of colon: Secondary | ICD-10-CM

## 2015-08-16 LAB — HEMOGLOBIN A1C: HEMOGLOBIN A1C: 6.1 % (ref 4.6–6.5)

## 2015-08-16 LAB — COMPREHENSIVE METABOLIC PANEL
ALBUMIN: 4.3 g/dL (ref 3.5–5.2)
ALK PHOS: 58 U/L (ref 39–117)
ALT: 16 U/L (ref 0–53)
AST: 22 U/L (ref 0–37)
BUN: 16 mg/dL (ref 6–23)
CO2: 28 mEq/L (ref 19–32)
Calcium: 9.5 mg/dL (ref 8.4–10.5)
Chloride: 107 mEq/L (ref 96–112)
Creatinine, Ser: 1.29 mg/dL (ref 0.40–1.50)
GFR: 72.21 mL/min (ref >60.00)
Glucose, Bld: 98 mg/dL (ref 70–99)
POTASSIUM: 4.3 meq/L (ref 3.5–5.1)
SODIUM: 144 meq/L (ref 135–145)
TOTAL PROTEIN: 7.2 g/dL (ref 6.0–8.3)
Total Bilirubin: 0.7 mg/dL (ref 0.2–1.2)

## 2015-08-16 LAB — CBC
HEMATOCRIT: 44.4 % (ref 39.0–52.0)
HEMOGLOBIN: 14.7 g/dL (ref 13.0–17.0)
MCHC: 33.1 g/dL (ref 30.0–36.0)
MCV: 85.1 fl (ref 78.0–100.0)
PLATELETS: 144 10*3/uL — AB (ref 150.0–400.0)
RBC: 5.22 Mil/uL (ref 4.22–5.81)
RDW: 14.5 % (ref 11.5–15.5)
WBC: 8.3 10*3/uL (ref 4.0–10.5)

## 2015-08-16 LAB — LIPID PANEL
Cholesterol: 217 mg/dL — ABNORMAL HIGH (ref 0–200)
HDL: 50.8 mg/dL (ref >39.00)
LDL Cholesterol: 154 mg/dL — ABNORMAL HIGH (ref 0–99)
NONHDL: 166.05
Total CHOL/HDL Ratio: 4
Triglycerides: 58 mg/dL (ref 0.0–149.0)
VLDL: 11.6 mg/dL (ref 0.0–40.0)

## 2015-08-16 LAB — PSA: PSA: 2.46 ng/mL (ref 0.10–4.00)

## 2015-08-16 MED ORDER — ZOSTER VACCINE LIVE 19400 UNT/0.65ML ~~LOC~~ SUSR: 0.6500 mL | Freq: Once | SUBCUTANEOUS | 0 refills | Status: AC

## 2015-08-16 MED ORDER — SIMVASTATIN 20 MG PO TABS: ORAL_TABLET | ORAL | 1 refills | Status: DC

## 2015-08-16 MED ORDER — LOSARTAN POTASSIUM 50 MG PO TABS: 50.0000 mg | ORAL_TABLET | Freq: Every day | ORAL | 1 refills | Status: DC

## 2015-08-16 NOTE — Progress Notes (Signed)
Subjective:    Patient ID: Angel Benson, male    DOB: May 19, 1950, 65 y.o.   MRN: [id number]  Chief Complaint  Patient presents with  . CPE    not fasting    HPI:  Angel Benson is a 65 y.o. male who presents today for an annual wellness visit.   1) Health Maintenance -   Diet - Averages about 3 meals per day consisting of a regular diet. Caffeine intake a couple of times per week.   Exercise - Walking on a regular basis; some resistance training.    2) Preventative Exams / Immunizations:  Dental -- Up to date  Vision -- Up to date   Health Maintenance  Topic Date Due  . Hepatitis C Screening  1950/02/04  . HIV Screening  01/19/1967  . COLONOSCOPY  01/18/2002  . ZOSTAVAX  01/19/2012  . FOOT EXAM  02/02/2015  . HEMOGLOBIN A1C  02/02/2015  . INFLUENZA VACCINE  08/16/2015  . OPHTHALMOLOGY EXAM  01/24/2016  . PNEUMOCOCCAL POLYSACCHARIDE VACCINE (2) 08/26/2018  . TETANUS/TDAP  02/02/2024    Immunization History  Administered Date(s) Administered  . Pneumococcal Polysaccharide-23 08/25/2013  . Tdap 02/01/2014    No Known Allergies   Outpatient Medications Prior to Visit  Medication Sig Dispense Refill  . aspirin 81 MG tablet Take 81 mg by mouth daily.    Marland Kitchen latanoprost (XALATAN) 0.005 % ophthalmic solution Place 1 drop into both eyes at bedtime.    . metoprolol succinate (TOPROL-XL) 50 MG 24 hr tablet Take 1 tablet (50 mg total) by mouth daily. Take with or immediately following a meal. Yearly physical due in Aug 90 tablet 0  . omeprazole (PRILOSEC) 20 MG capsule Take 1 capsule (20 mg total) by mouth daily. Yearly physical is due must see Angel Benson for future refills 90 capsule 0  . losartan (COZAAR) 50 MG tablet TAKE 1 TABLET DAILY 90 tablet 0  . simvastatin (ZOCOR) 20 MG tablet TAKE 1 TABLET DAILY (PATIENT NEEDS OFFICE VISIT BEFORE ANY FURTHER REFILLS) 90 tablet 0   No facility-administered medications prior to visit.      Past Medical History:  Diagnosis  Date  . Allergy   . CAD, multiple vessel    s/p CABG  . Chicken pox   . Diabetes mellitus without complication (HCC)    Type 2  . GERD (gastroesophageal reflux disease)   . Hypercholesteremia   . Hypertension   . Urinary tract bacterial infections      Past Surgical History:  Procedure Laterality Date  . CARDIAC CATHETERIZATION    . CORONARY ARTERY BYPASS GRAFT N/A 08/26/2013   Procedure: CORONARY ARTERY BYPASS GRAFTING (CABG) times five, using left internal mammary artery, right and left greater saphenous vein;  Surgeon: Angel Isaac, MD;  Location: [facility];  Service: Open Heart Surgery;  Laterality: N/A;  LIMA-LAD; SEQ SVG-OM1-OM2-OM3; SVG-RCA   . INTRAOPERATIVE TRANSESOPHAGEAL ECHOCARDIOGRAM N/A 08/26/2013   Procedure: INTRAOPERATIVE TRANSESOPHAGEAL ECHOCARDIOGRAM;  Surgeon: Angel Isaac, MD;  Location: [facility];  Service: Open Heart Surgery;  Laterality: N/A;     Family History  Problem Relation Age of Onset  . Diabetes Mother   . Alzheimer's disease Mother   . Heart disease Father   . Diabetes Father   . Heart disease Brother      Social History   Social History  . Marital status: Divorced    Spouse name: N/A  . Number of children: 1  . Years of education: 43  Occupational History  . Retired - Physicist, medical    Social History Main Topics  . Smoking status: Never Smoker  . Smokeless tobacco: Never Used  . Alcohol use No  . Drug use: No  . Sexual activity: Not on file   Other Topics Concern  . Not on file   Social History Narrative   Born in [place] and raised in [place]. Currently resides in an apartment with his sister. Fun: Basketball   Denies religious beliefs effecting healthcare.      Review of Systems  Constitutional: Denies fever, chills, fatigue, or significant weight gain/loss. HENT: Head: Denies headache or neck pain Ears: Denies changes in hearing, ringing in ears, earache, drainage Nose: Denies discharge, stuffiness, itching,  nosebleed, sinus pain Throat: Denies sore throat, hoarseness, dry mouth, sores, thrush Eyes: Denies loss/changes in vision, pain, redness, blurry/double vision, flashing lights Cardiovascular: Denies chest pain/discomfort, tightness, palpitations, shortness of breath with activity, difficulty lying down, swelling, sudden awakening with shortness of breath Respiratory: Denies shortness of breath, cough, sputum production, wheezing Gastrointestinal: Denies dysphasia, heartburn, change in appetite, nausea, change in bowel habits, rectal bleeding, constipation, diarrhea, yellow skin or eyes Genitourinary: Denies frequency, urgency, burning/pain, blood in urine, incontinence, change in urinary strength. Musculoskeletal: Denies muscle/joint pain, stiffness, back pain, redness or swelling of joints, trauma Skin: Denies rashes, lumps, itching, dryness, color changes, or hair/nail changes Neurological: Denies dizziness, fainting, seizures, weakness, numbness, tingling, tremor Psychiatric - Denies nervousness, stress, depression or memory loss Endocrine: Denies heat or cold intolerance, sweating, frequent urination, excessive thirst, changes in appetite Hematologic: Denies ease of bruising or bleeding     Objective:     BP 132/72 (BP Location: Left Arm, Patient Position: Sitting, Cuff Size: Normal)   Pulse 81   Temp 98 F (36.7 C) (Oral)   Resp 16   Ht 5\' 9"  (1.753 m)   Wt 182 lb (82.6 kg)   SpO2 98%   BMI 26.88 kg/m  Nursing note and vital signs reviewed.  Physical Exam  Constitutional: He is oriented to person, place, and time. He appears well-developed and well-nourished.  HENT:  Head: Normocephalic.  Right Ear: Hearing, tympanic membrane, external ear and ear canal normal.  Left Ear: Hearing, tympanic membrane, external ear and ear canal normal.  Nose: Nose normal.  Mouth/Throat: Uvula is midline, oropharynx is clear and moist and mucous membranes are normal.  Eyes: Conjunctivae and  EOM are normal. Pupils are equal, round, and reactive to light.  Neck: Neck supple. No JVD present. No tracheal deviation present. No thyromegaly present.  Cardiovascular: Normal rate, regular rhythm, normal heart sounds and intact distal pulses.   Pulmonary/Chest: Effort normal and breath sounds normal.  Abdominal: Soft. Bowel sounds are normal. He exhibits no distension and no mass. There is no tenderness. There is no rebound and no guarding.  Musculoskeletal: Normal range of motion. He exhibits no edema or tenderness.  Lymphadenopathy:    He has no cervical adenopathy.  Neurological: He is alert and oriented to person, place, and time. He has normal reflexes. No cranial nerve deficit. He exhibits normal muscle tone. Coordination normal.  Skin: Skin is warm and dry.  Psychiatric: He has a normal mood and affect. His behavior is normal. Judgment and thought content normal.       Assessment & Plan:   Problem List Items Addressed This Visit      Other   Routine general medical examination at a health care facility    1) Anticipatory Guidance: Discussed  importance of wearing a seatbelt while driving and not texting while driving; changing batteries in smoke detector at least once annually; wearing suntan lotion when outside; eating a balanced and moderate diet; getting physical activity at least 30 minutes per day.  2) Immunizations / Screenings / Labs:  Prescription for Zostavax provided today. All other immunizations are up-to-date per recommendations. Obtain A1c for diabetes screening. Obtain PSA for prostate cancer screening. Obtain hepatitis C antibody for hepatitis C screening. Diabetic foot exam completed today. All other screenings are up-to-date per recommendations. Obtain CBC, CMET, and lipid profile.   Overall well exam with risk factors for cardiovascular disease including hypertension, type 2 diabetes, hyperlipidemia and coronary artery disease. Chronic conditions appear  adequately controlled with medication regimen and no adverse side effects. He exercises on a regular basis and consumes a balanced and nutritionally adequate diet. Continue healthy lifestyle behaviors and choices. Follow-up prevention exam in 1 year. Follow-up office visit for chronic conditions pending blood work if necessary.      Relevant Orders   CBC (Completed)   Comprehensive metabolic panel (Completed)   PSA (Completed)   Lipid panel (Completed)   Hepatitis C antibody   Hemoglobin A1c (Completed)    Other Visit Diagnoses    Colon cancer screening    -  Primary   Relevant Orders   Ambulatory referral to Gastroenterology               I have changed Mr. Newstrom's simvastatin and losartan. I am also having him start on Zoster Vaccine Live (PF). Additionally, I am having him maintain his aspirin, latanoprost, metoprolol succinate, and omeprazole.   Meds ordered this encounter  Medications  . simvastatin (ZOCOR) 20 MG tablet    Sig: TAKE 1 TABLET DAILY    Dispense:  90 tablet    Refill:  1    Order Specific Question:   Supervising Provider    Answer:   Pricilla Holm A [id number]  . losartan (COZAAR) 50 MG tablet    Sig: Take 1 tablet (50 mg total) by mouth daily.    Dispense:  90 tablet    Refill:  1    Order Specific Question:   Supervising Provider    Answer:   Pricilla Holm A [id number]  . Zoster Vaccine Live, PF, (ZOSTAVAX) 24401 UNT/0.65ML injection    Sig: Inject 19,400 Units into the skin once.    Dispense:  1 each    Refill:  0    Order Specific Question:   Supervising Provider    Answer:   Pricilla Holm A [id number]     Follow-up: Return in about 1 year (around 08/15/2016), or if symptoms worsen or fail to improve.   Mauricio Po, FNP

## 2015-08-16 NOTE — Assessment & Plan Note (Signed)
1) Anticipatory Guidance: Discussed importance of wearing a seatbelt while driving and not texting while driving; changing batteries in smoke detector at least once annually; wearing suntan lotion when outside; eating a balanced and moderate diet; getting physical activity at least 30 minutes per day.  2) Immunizations / Screenings / Labs:  Prescription for Zostavax provided today. All other immunizations are up-to-date per recommendations. Obtain A1c for diabetes screening. Obtain PSA for prostate cancer screening. Obtain hepatitis C antibody for hepatitis C screening. Diabetic foot exam completed today. All other screenings are up-to-date per recommendations. Obtain CBC, CMET, and lipid profile.   Overall well exam with risk factors for cardiovascular disease including hypertension, type 2 diabetes, hyperlipidemia and coronary artery disease. Chronic conditions appear adequately controlled with medication regimen and no adverse side effects. He exercises on a regular basis and consumes a balanced and nutritionally adequate diet. Continue healthy lifestyle behaviors and choices. Follow-up prevention exam in 1 year. Follow-up office visit for chronic conditions pending blood work if necessary.

## 2015-08-16 NOTE — Patient Instructions (Signed)
Thank you for choosing [facility].  Summary/Instructions:  Your prescription(s) have been submitted to your pharmacy or been printed and provided for you. Please take as directed and contact our office if you believe you are having problem(s) with the medication(s) or have any questions.  Please stop by the lab on the lower level of the building for your blood work. Your results will be released to [facility] (or called to you) after review, usually within 72 hours after test completion. If any changes need to be made, you will be notified at that same time.  1. The lab is open from 7:30am to 5:30 pm Monday-Friday  2. No appointment is necessary  3. Fasting (if needed) is 6-8 hours after food and drink; black coffee and water  are okay    If your symptoms worsen or fail to improve, please contact our office for further instruction, or in case of emergency go directly to the emergency room at the closest medical facility.   Health Maintenance, Male A healthy lifestyle and preventative care can promote health and wellness.  Maintain regular health, dental, and eye exams.  Eat a healthy diet. Foods like vegetables, fruits, whole grains, low-fat dairy products, and lean protein foods contain the nutrients you need and are low in calories. Decrease your intake of foods high in solid fats, added sugars, and salt. Get information about a proper diet from your health care provider, if necessary.  Regular physical exercise is one of the most important things you can do for your health. Most adults should get at least 150 minutes of moderate-intensity exercise (any activity that increases your heart rate and causes you to sweat) each week. In addition, most adults need muscle-strengthening exercises on 2 or more days a week.   Maintain a healthy weight. The body mass index (BMI) is a screening tool to identify possible weight problems. It provides an estimate of body fat based on height and  weight. Your health care provider can find your BMI and can help you achieve or maintain a healthy weight. For males 20 years and older:  A BMI below 18.5 is considered underweight.  A BMI of 18.5 to 24.9 is normal.  A BMI of 25 to 29.9 is considered overweight.  A BMI of 30 and above is considered obese.  Maintain normal blood lipids and cholesterol by exercising and minimizing your intake of saturated fat. Eat a balanced diet with plenty of fruits and vegetables. Blood tests for lipids and cholesterol should begin at age 32 and be repeated every 5 years. If your lipid or cholesterol levels are high, you are over age 32, or you are at high risk for heart disease, you may need your cholesterol levels checked more frequently.Ongoing high lipid and cholesterol levels should be treated with medicines if diet and exercise are not working.  If you smoke, find out from your health care provider how to quit. If you do not use tobacco, do not start.  Lung cancer screening is recommended for adults aged 83-80 years who are at high risk for developing lung cancer because of a history of smoking. A yearly low-dose CT scan of the lungs is recommended for people who have at least a 30-pack-year history of smoking and are current smokers or have quit within the past 15 years. A pack year of smoking is smoking an average of 1 pack of cigarettes a day for 1 year (for example, a 30-pack-year history of smoking could mean smoking 1  pack a day for 30 years or 2 packs a day for 15 years). Yearly screening should continue until the smoker has stopped smoking for at least 15 years. Yearly screening should be stopped for people who develop a health problem that would prevent them from having lung cancer treatment.  If you choose to drink alcohol, do not have more than 2 drinks per day. One drink is considered to be 12 oz (360 mL) of beer, 5 oz (150 mL) of wine, or 1.5 oz (45 mL) of liquor.  Avoid the use of street  drugs. Do not share needles with anyone. Ask for help if you need support or instructions about stopping the use of drugs.  High blood pressure causes heart disease and increases the risk of stroke. High blood pressure is more likely to develop in:  People who have blood pressure in the end of the normal range (100-139/85-89 mm Hg).  People who are overweight or obese.  People who are African American.  If you are 79-13 years of age, have your blood pressure checked every 3-5 years. If you are 58 years of age or older, have your blood pressure checked every year. You should have your blood pressure measured twice--once when you are at a hospital or clinic, and once when you are not at a hospital or clinic. Record the average of the two measurements. To check your blood pressure when you are not at a hospital or clinic, you can use:  An automated blood pressure machine at a pharmacy.  A home blood pressure monitor.  If you are 74-83 years old, ask your health care provider if you should take aspirin to prevent heart disease.  Diabetes screening involves taking a blood sample to check your fasting blood sugar level. This should be done once every 3 years after age 82 if you are at a normal weight and without risk factors for diabetes. Testing should be considered at a younger age or be carried out more frequently if you are overweight and have at least 1 risk factor for diabetes.  Colorectal cancer can be detected and often prevented. Most routine colorectal cancer screening begins at the age of 4 and continues through age 74. However, your health care provider may recommend screening at an earlier age if you have risk factors for colon cancer. On a yearly basis, your health care provider may provide home test kits to check for hidden blood in the stool. A small camera at the end of a tube may be used to directly examine the colon (sigmoidoscopy or colonoscopy) to detect the earliest forms of  colorectal cancer. Talk to your health care provider about this at age 22 when routine screening begins. A direct exam of the colon should be repeated every 5-10 years through age 66, unless early forms of precancerous polyps or small growths are found.  People who are at an increased risk for hepatitis B should be screened for this virus. You are considered at high risk for hepatitis B if:  You were born in a country where hepatitis B occurs often. Talk with your health care provider about which countries are considered high risk.  Your parents were born in a high-risk country and you have not received a shot to protect against hepatitis B (hepatitis B vaccine).  You have HIV or AIDS.  You use needles to inject street drugs.  You live with, or have sex with, someone who has hepatitis B.  You are a man  who has sex with other men (MSM).  You get hemodialysis treatment.  You take certain medicines for conditions like cancer, organ transplantation, and autoimmune conditions.  Hepatitis C blood testing is recommended for all people born from [birth year] through 1965 and any individual with known risk factors for hepatitis C.  Healthy men should no longer receive prostate-specific antigen (PSA) blood tests as part of routine cancer screening. Talk to your health care provider about prostate cancer screening.  Testicular cancer screening is not recommended for adolescents or adult males who have no symptoms. Screening includes self-exam, a health care provider exam, and other screening tests. Consult with your health care provider about any symptoms you have or any concerns you have about testicular cancer.  Practice safe sex. Use condoms and avoid high-risk sexual practices to reduce the spread of sexually transmitted infections (STIs).  You should be screened for STIs, including gonorrhea and chlamydia if:  You are sexually active and are younger than 24 years.  You are older than 24 years, and  your health care provider tells you that you are at risk for this type of infection.  Your sexual activity has changed since you were last screened, and you are at an increased risk for chlamydia or gonorrhea. Ask your health care provider if you are at risk.  If you are at risk of being infected with HIV, it is recommended that you take a prescription medicine daily to prevent HIV infection. This is called pre-exposure prophylaxis (PrEP). You are considered at risk if:  You are a man who has sex with other men (MSM).  You are a heterosexual man who is sexually active with multiple partners.  You take drugs by injection.  You are sexually active with a partner who has HIV.  Talk with your health care provider about whether you are at high risk of being infected with HIV. If you choose to begin PrEP, you should first be tested for HIV. You should then be tested every 3 months for as long as you are taking PrEP.  Use sunscreen. Apply sunscreen liberally and repeatedly throughout the day. You should seek shade when your shadow is shorter than you. Protect yourself by wearing long sleeves, pants, a wide-brimmed hat, and sunglasses year round whenever you are outdoors.  Tell your health care provider of new moles or changes in moles, especially if there is a change in shape or color. Also, tell your health care provider if a mole is larger than the size of a pencil eraser.  A one-time screening for abdominal aortic aneurysm (AAA) and surgical repair of large AAAs by ultrasound is recommended for men aged 42-75 years who are current or former smokers.  Stay current with your vaccines (immunizations).   This information is not intended to replace advice given to you by your health care provider. Make sure you discuss any questions you have with your health care provider.   Document Released: 06/30/2007 Document Revised: 01/22/2014 Document Reviewed: 05/29/2010 Elsevier Interactive Patient  Education [employer].

## 2015-08-17 LAB — HEPATITIS C ANTIBODY: HCV AB: NEGATIVE

## 2015-09-05 ENCOUNTER — Other Ambulatory Visit: Payer: Self-pay | Admitting: Family

## 2015-09-27 ENCOUNTER — Other Ambulatory Visit: Payer: Self-pay | Admitting: Family

## 2015-10-13 ENCOUNTER — Encounter: Payer: Self-pay | Admitting: Family

## 2015-10-19 LAB — HM DIABETES EYE EXAM

## 2015-10-20 ENCOUNTER — Encounter: Payer: Self-pay | Admitting: Family

## 2015-10-20 ENCOUNTER — Ambulatory Visit (INDEPENDENT_AMBULATORY_CARE_PROVIDER_SITE_OTHER): Admitting: Internal Medicine

## 2015-10-20 ENCOUNTER — Encounter: Payer: Self-pay | Admitting: Internal Medicine

## 2015-10-20 VITALS — BP 122/70 | HR 73 | Ht 69.0 in | Wt 178.8 lb

## 2015-10-20 DIAGNOSIS — I10 Essential (primary) hypertension: Secondary | ICD-10-CM | POA: Diagnosis not present

## 2015-10-20 DIAGNOSIS — E785 Hyperlipidemia, unspecified: Secondary | ICD-10-CM | POA: Diagnosis not present

## 2015-10-20 DIAGNOSIS — I2510 Atherosclerotic heart disease of native coronary artery without angina pectoris: Secondary | ICD-10-CM

## 2015-10-20 DIAGNOSIS — Z951 Presence of aortocoronary bypass graft: Secondary | ICD-10-CM

## 2015-10-20 MED ORDER — ROSUVASTATIN CALCIUM 40 MG PO TABS: 40.0000 mg | ORAL_TABLET | Freq: Every day | ORAL | 0 refills | Status: DC

## 2015-10-20 NOTE — Progress Notes (Signed)
10/20/2015   PCP: Mauricio Po, FNP   Chief Complaint  Patient presents with  . Follow-up    edema; in left foot and leg    Primary Cardiologist:Dr. Alma Friendly  HPI: 65 year old AA male with no prior cardiac hx was admitted to [facility] 08/24/13 with chest pain, + NSTEMI.  Cardiac cath with severe triple vessel CAD with total occlusion of the distal RCA, severe proximal LAD stenosis involving a long segment, occluded distal LAD, high grade disease in all three obtuse marginal branches and the mid Circumflex.  Mild segmental LV systolic dysfunction.  He then underwent PROCEDURE: Procedure(s):   CORONARY ARTERY BYPASS GRAFTING (CABG)X5 LIMA-LAD; SEQ SVG-OM1-OM2-OM3; SVG-RCA  INTRAOPERATIVE TRANSESOPHAGEAL ECHOCARDIOGRAM EVH RIGHT THIGH OVH RIGHT LOWER AND LEFT LOWER LEG by Dr. Servando Snare 08/26/13.  Pt did well post op, no atrial fib and progressed with out complications.  Since discharge he has done well no complaint today.  Walking 20 min a day- a mile or so.  occ surgical chest pain.  Eating healthy.    Angel Benson has no complaints today. He follows up and is doing well. He started cardiac rehabilitation and is doing well with his exercise. Denies any chest pain. He was supposed to be on lisinopril however the prescription was never placed. We did go ahead and start low-dose lisinopril and he is taking it without complications.   Angel Benson returns today for follow-up. Overall he is doing very well. He denies any chest pain or worsening shortness of breath. It was noted that during his catheterization EF is reduced  To 45-50%. This is not been reassessed postoperatively. He is also not had a check of his cholesterol in some time.  10/20/2015  Angel Benson returns today for follow-up. He reports doing well and denies any chest pain or worsening shortness of breath.He saw his primary care provider in August of this year and had blood work including a lipid panel For which she is total cholesterol was  217 however LDL remained very high at 154. This is on low-dose simvastatin. Based on prescribing regulations by the FDA, we cannot further increase his dose of simvastatin. In fact he needs to be on a high-dose statin and I would recommend switching to Crestor 40 mg daily. After his last office visit we repeated his echocardiogram which showed normalization of LV function. This indicates successful revascularization. Blood pressure is well-controlled today.  No Known Allergies  Current Outpatient Prescriptions  Medication Sig Dispense Refill  . aspirin 81 MG tablet Take 81 mg by mouth daily.    Marland Kitchen latanoprost (XALATAN) 0.005 % ophthalmic solution Place 1 drop into both eyes at bedtime.    Marland Kitchen losartan (COZAAR) 50 MG tablet Take 1 tablet (50 mg total) by mouth daily. 90 tablet 1  . omeprazole (PRILOSEC) 20 MG capsule TAKE 1 CAPSULE DAILY. (YEARLY PHYSICAL IS DUE MUST SEE GREG FOR FUTURE REFILLS) 90 capsule 0  . simvastatin (ZOCOR) 20 MG tablet TAKE 1 TABLET DAILY 90 tablet 1  . TOPROL XL 50 MG 24 hr tablet TAKE 1 TABLET DAILY. TAKE WITH OR IMMEDIATELY FOLLOWING A MEAL (YEARLY PHYSICAL DUE IN AUGUST) 90 tablet 0   No current facility-administered medications for this visit.     Past Medical History:  Diagnosis Date  . Allergy   . CAD, multiple vessel    s/p CABG  . Chicken pox   . Diabetes mellitus without complication (HCC)    Type 2  . GERD (gastroesophageal reflux  disease)   . Hypercholesteremia   . Hypertension   . Urinary tract bacterial infections     Past Surgical History:  Procedure Laterality Date  . CARDIAC CATHETERIZATION    . CORONARY ARTERY BYPASS GRAFT N/A 08/26/2013   Procedure: CORONARY ARTERY BYPASS GRAFTING (CABG) times five, using left internal mammary artery, right and left greater saphenous vein;  Surgeon: Grace Isaac, MD;  Location: [place];  Service: Open Heart Surgery;  Laterality: N/A;  LIMA-LAD; SEQ SVG-OM1-OM2-OM3; SVG-RCA   . INTRAOPERATIVE  TRANSESOPHAGEAL ECHOCARDIOGRAM N/A 08/26/2013   Procedure: INTRAOPERATIVE TRANSESOPHAGEAL ECHOCARDIOGRAM;  Surgeon: Grace Isaac, MD;  Location: [place];  Service: Open Heart Surgery;  Laterality: N/A;    [id number] colds or fevers, no weight changes Skin:no rashes or ulcers HEENT:no blurred vision, no congestion CV:see HPI PUL:see HPI GI:no diarrhea constipation or melena, no indigestion GU:no hematuria, no dysuria MS:no joint pain, no claudication Neuro:no syncope, no lightheadedness Endo:+ diabetes stable, no thyroid disease  Wt Readings from Last 3 Encounters:  10/20/15 178 lb 12.8 oz (81.1 kg)  08/16/15 182 lb (82.6 kg)  09/27/14 182 lb 6.4 oz (82.7 kg)    PHYSICAL EXAM BP 122/70   Pulse 73   Ht 5\' 9"  (1.753 m)   Wt 178 lb 12.8 oz (81.1 kg)   BMI 26.40 kg/m   GEN: Awake, NAD HEENT: PERRLA, EOMI  lungs: clear bilaterally Abdomen: soft , nontender positive bowel sounds  cardiovascular: regular rate and rhythm , no murmur  extremities: No edema  neurologic, grossly nonfocal Psych: Pleasant  EKG: Sinus rhythm at 73  ASSESSMENT AND PLAN Patient Active Problem List   Diagnosis Date Noted  . Cardiomyopathy, ischemic 09/27/2014  . Routine general medical examination at a health care facility 02/01/2014  . S/P CABG x 5 08/26/2013  . Hyperlipidemia 08/25/2013  . CAD (coronary artery disease), native coronary artery 08/25/2013  . Benign essential HTN 08/25/2013  . DM2 (diabetes mellitus, type 2) ([place]) 08/25/2013  . NSTEMI (non-ST elevated myocardial infarction) ([place]) 08/24/2013   PLAN: 1.  Angel Benson is doing well post bypass. His LVEF has normalized. He denies any recurrent chest pain or worsening shortness of breath. Cholesterol remains very high with LDL 154. We'll stop his Zocor and change him to Crestor 40 mg daily. He may need to add ezetimibe, but we will reassess his cholesterol in 3 months.  Follow-up annually or sooner as necessary.  Pixie Casino, MD, [facility] Attending Cardiologist [place]

## 2015-10-20 NOTE — Patient Instructions (Addendum)
Medication Instructions:  STOP Simvastatin START Crestor 40mg  Take 1 tab by mouth once a day  Labwork: Your physician recommends that you return for lab work in: 3 months fasting lipid  Testing/Procedures: None   Follow-Up: Your physician wants you to follow-up in: 12 months with Dr Debara Pickett. You will receive a reminder letter in the mail two months in advance. If you don't receive a letter, please call our office to schedule the follow-up appointment.  Any Other Special Instructions Will Be Listed Below (If Applicable).     If you need a refill on your cardiac medications before your next appointment, please call your pharmacy.

## 2015-10-26 ENCOUNTER — Encounter: Payer: Self-pay | Admitting: Family

## 2015-11-17 ENCOUNTER — Other Ambulatory Visit: Payer: Self-pay

## 2015-11-17 MED ORDER — ROSUVASTATIN CALCIUM 40 MG PO TABS: 40.0000 mg | ORAL_TABLET | Freq: Every day | ORAL | 1 refills | Status: DC

## 2015-12-04 ENCOUNTER — Other Ambulatory Visit: Payer: Self-pay | Admitting: Family

## 2015-12-22 IMAGING — CR DG CHEST 1V PORT
1 series · 1 of 1 positions shown · non-contrast
Comparison: PA and lateral chest x-ray August 24, 2013

CLINICAL DATA: Status post CABG

EXAM:
PORTABLE CHEST - 1 VIEW

[AP]
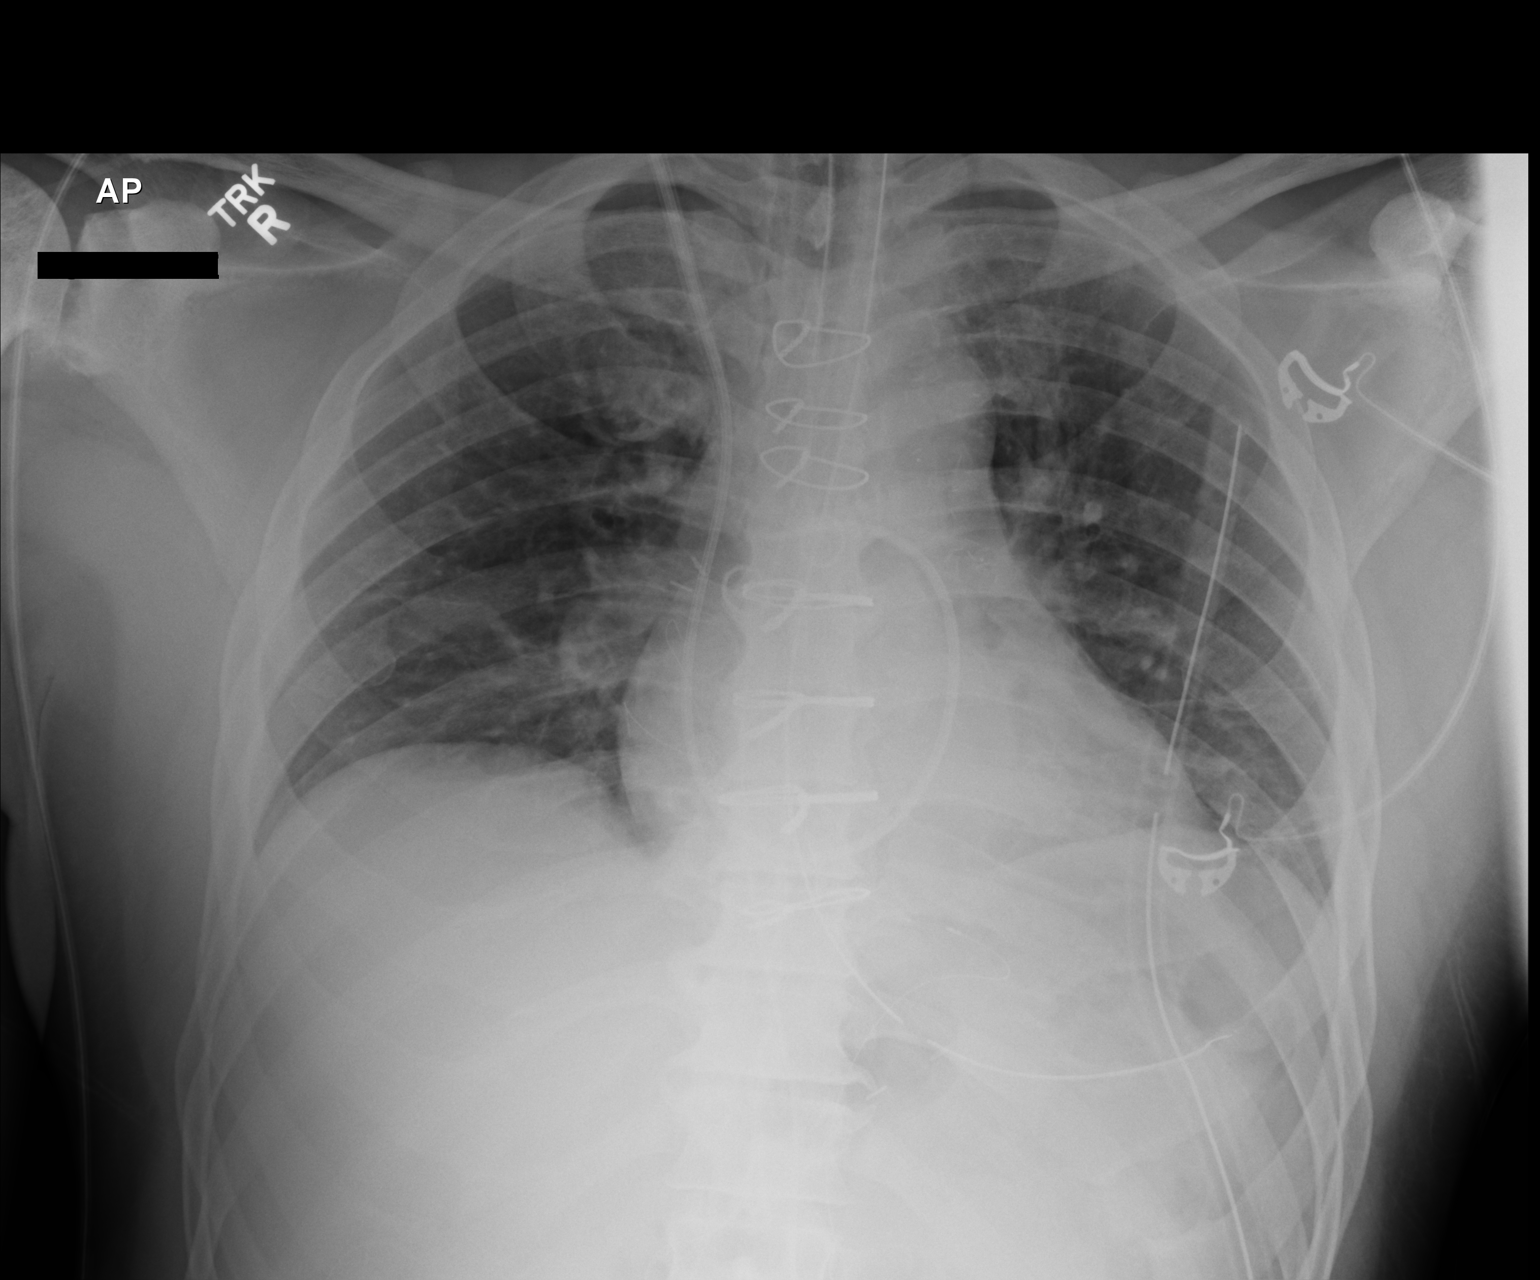

[1 of 1 positions shown; findings below may reference images not displayed]

FINDINGS: The lungs are mildly hypoinflated. There is no focal infiltrate.
There is no pleural effusion or pneumothorax. The cardiac silhouette
is top-normal in size.

The left-sided chest tube tip overlies the posterior lateral aspect
of the left fourth rib. The endotracheal tube tip lies approximately
4 cm above the crotch of the carina. The Swan-Ganz catheter tip lies
in the right main pulmonary artery. The esophagogastric tube tip and
proximal port project below the level of the GE junction.
IMPRESSION: There is mild bilateral hypoinflation. No acute postprocedure
complication is demonstrated. The support tubes and lines are in
appropriate position.

## 2015-12-23 IMAGING — CR DG CHEST 1V PORT
1 series · 1 of 1 positions shown · non-contrast
Comparison: Portable chest x-ray August 26, 2013 and
preoperative PA and lateral chest x-ray August 24, 2013.

CLINICAL DATA: Status post CABG

EXAM:
PORTABLE CHEST - 1 VIEW

[portable]
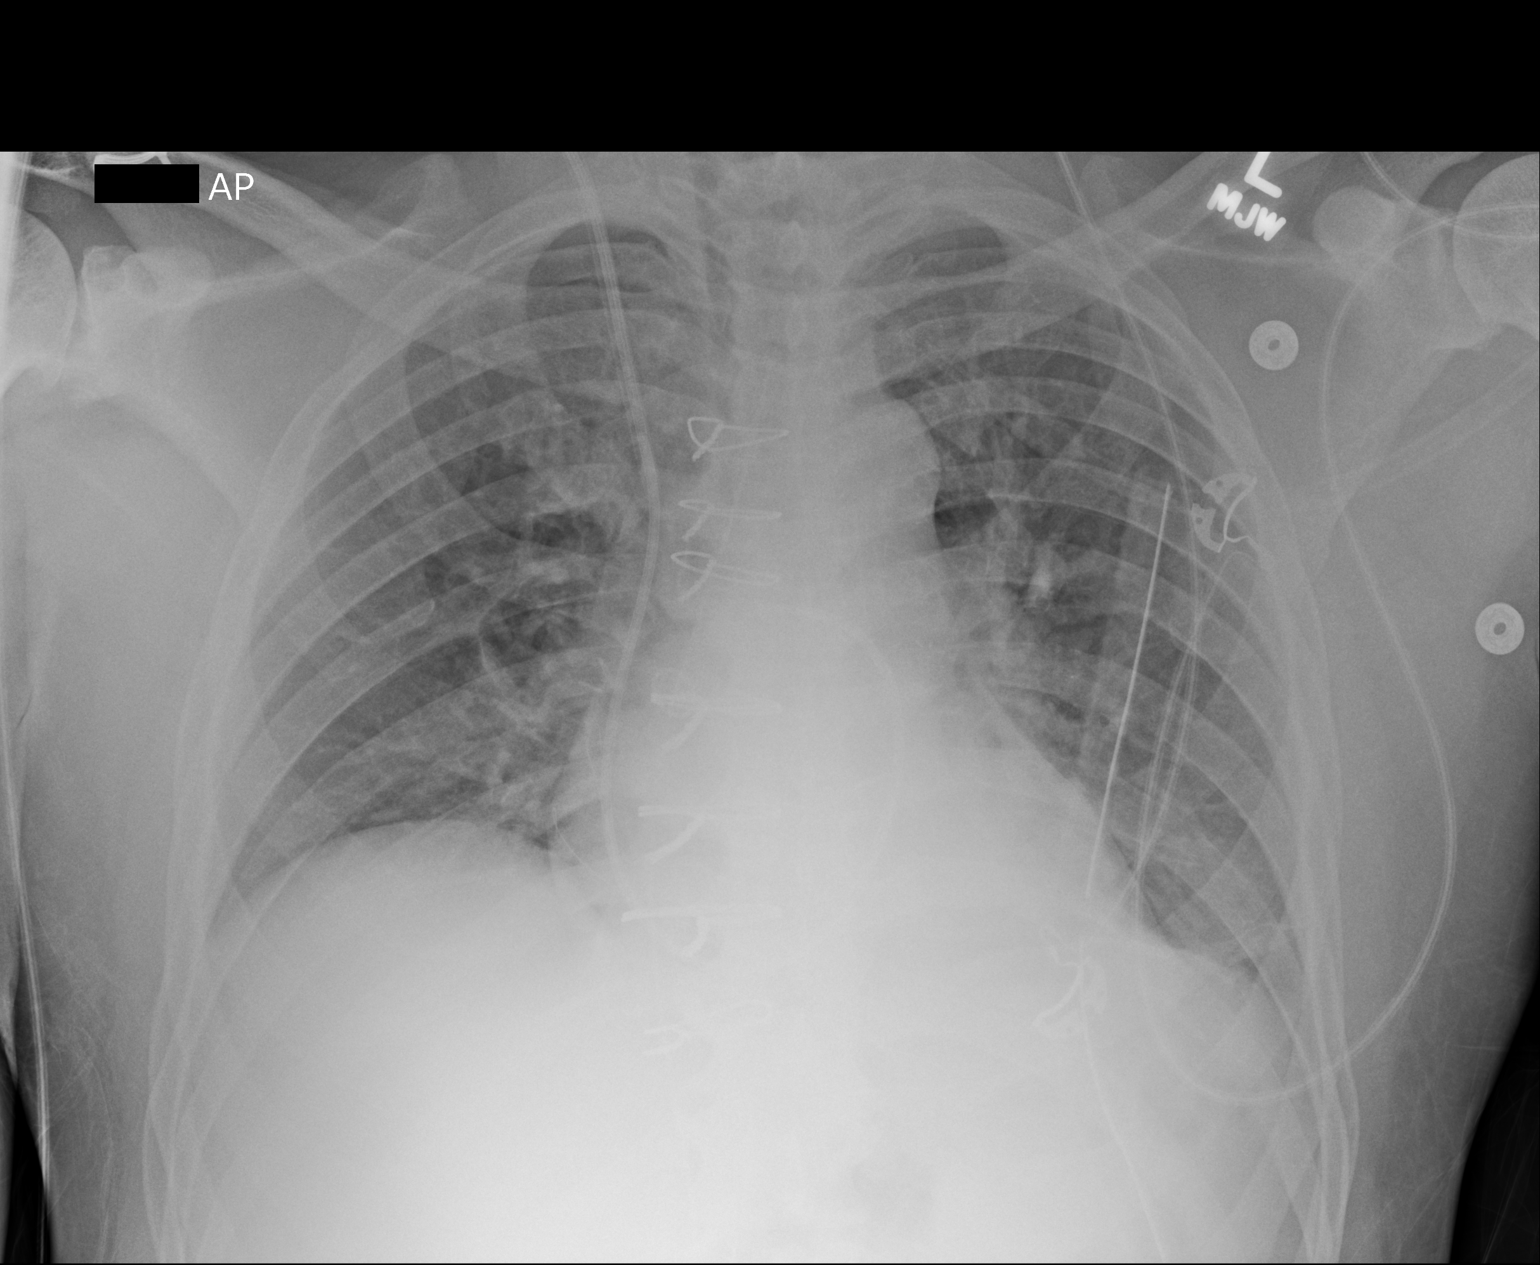

[1 of 1 positions shown; findings below may reference images not displayed]

FINDINGS: There has been interval extubation of the trachea and the esophagus.
The lungs are mildly hypoinflated. The pulmonary interstitial
markings are slightly more conspicuous bilaterally.The left-sided
chest tube tip overlies the posterior interspace of the fifth and
sixth ribs. There is no pneumothorax or significant pleural
effusion. The right internal jugular Cordis sheath contains the
Swan-Ganz catheter whose tip lies in the proximal right main
pulmonary artery. The cardiac silhouette is normal in size. The
pulmonary vascularity is mildly prominent centrally though stable.
IMPRESSION: Since extubation the lung volumes remain mildly decreased and there
has developed increased interstitial density bilaterally consistent
with mild edema. There is no significant pleural effusion and no
pneumothorax.

## 2015-12-25 IMAGING — CR DG CHEST 1V PORT
1 series · 1 of 1 positions shown · non-contrast
Comparison: 08/28/2013.

CLINICAL DATA: Evaluate for pneumothorax after chest tube removal.

EXAM:
PORTABLE CHEST - 1 VIEW

[AP]
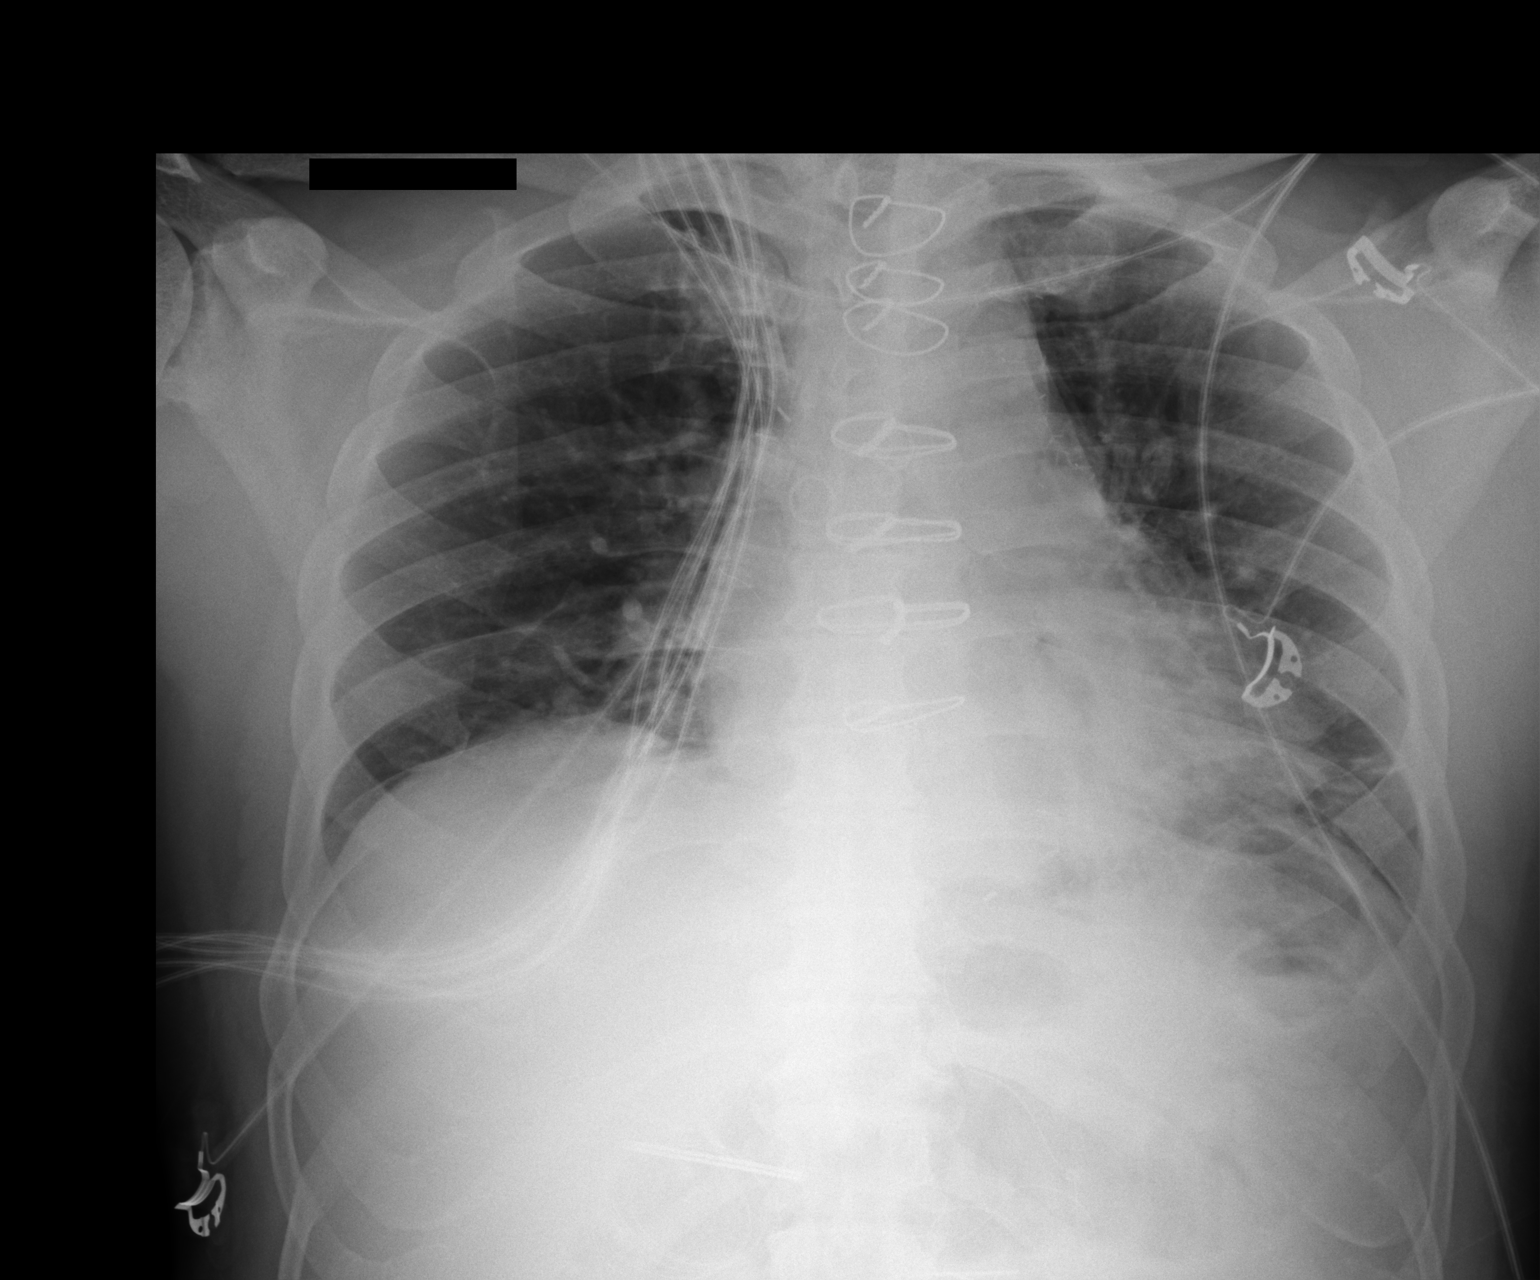

[1 of 1 positions shown; findings below may reference images not displayed]

FINDINGS: Cardiomegaly. LEFT basilar atelectasis is subsegmental, improved.
LEFT chest tube has been removed. There is no pneumothorax. Trace
LEFT pleural effusion. Prior CABG. RIGHT lung clear. Swan-Ganz
catheter introducer unchanged.
IMPRESSION: LEFT chest tube removed. No pneumothorax. Persistent but improved
atelectasis of the LEFT base.

## 2015-12-26 ENCOUNTER — Other Ambulatory Visit: Payer: Self-pay | Admitting: Family

## 2015-12-26 IMAGING — CR DG CHEST 2V
2 series · 2 of 2 positions shown · non-contrast
Comparison: Radiograph 08/29/2013

CLINICAL DATA: Status post CABG

EXAM:
CHEST  2 VIEW

[w chest pa]
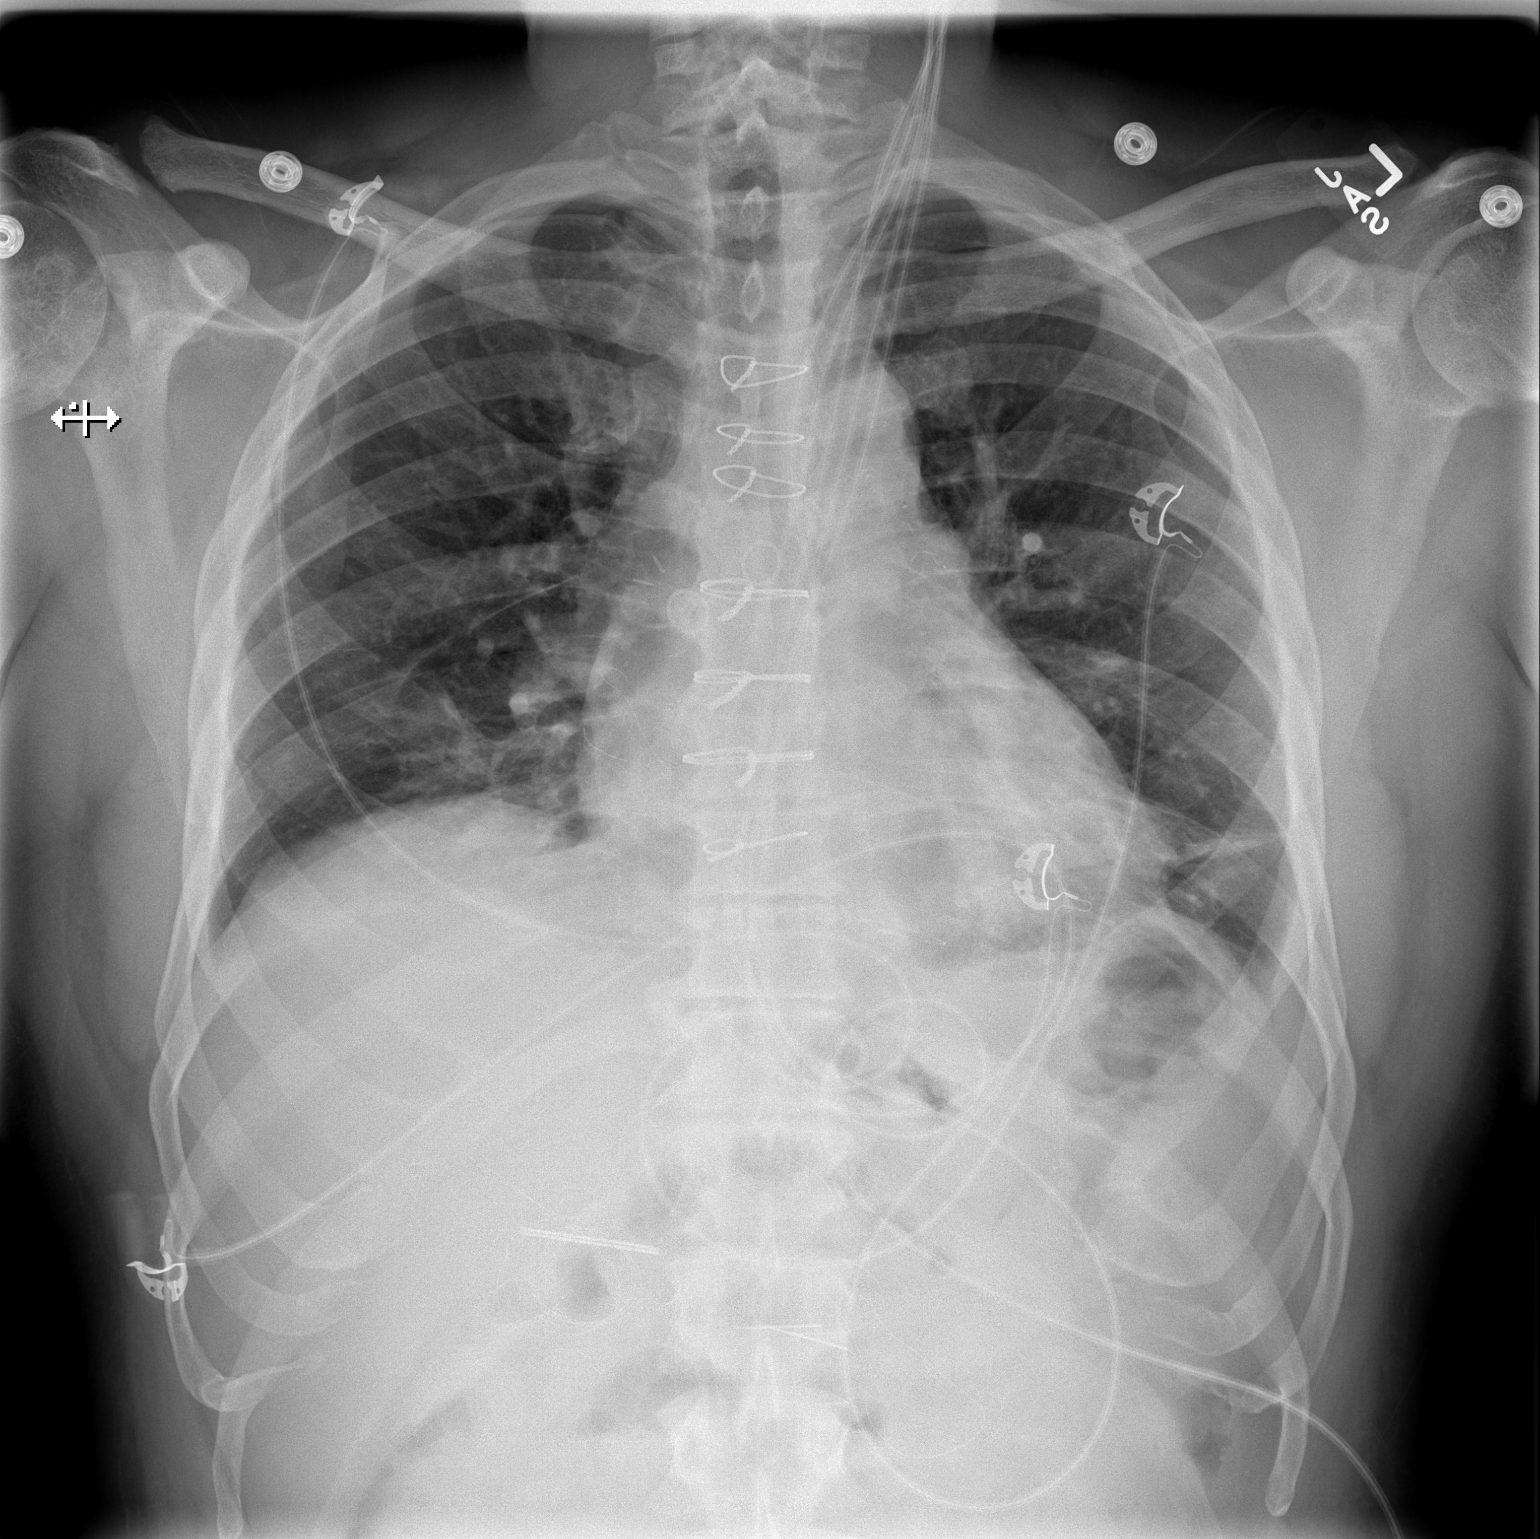

[w chest lat]
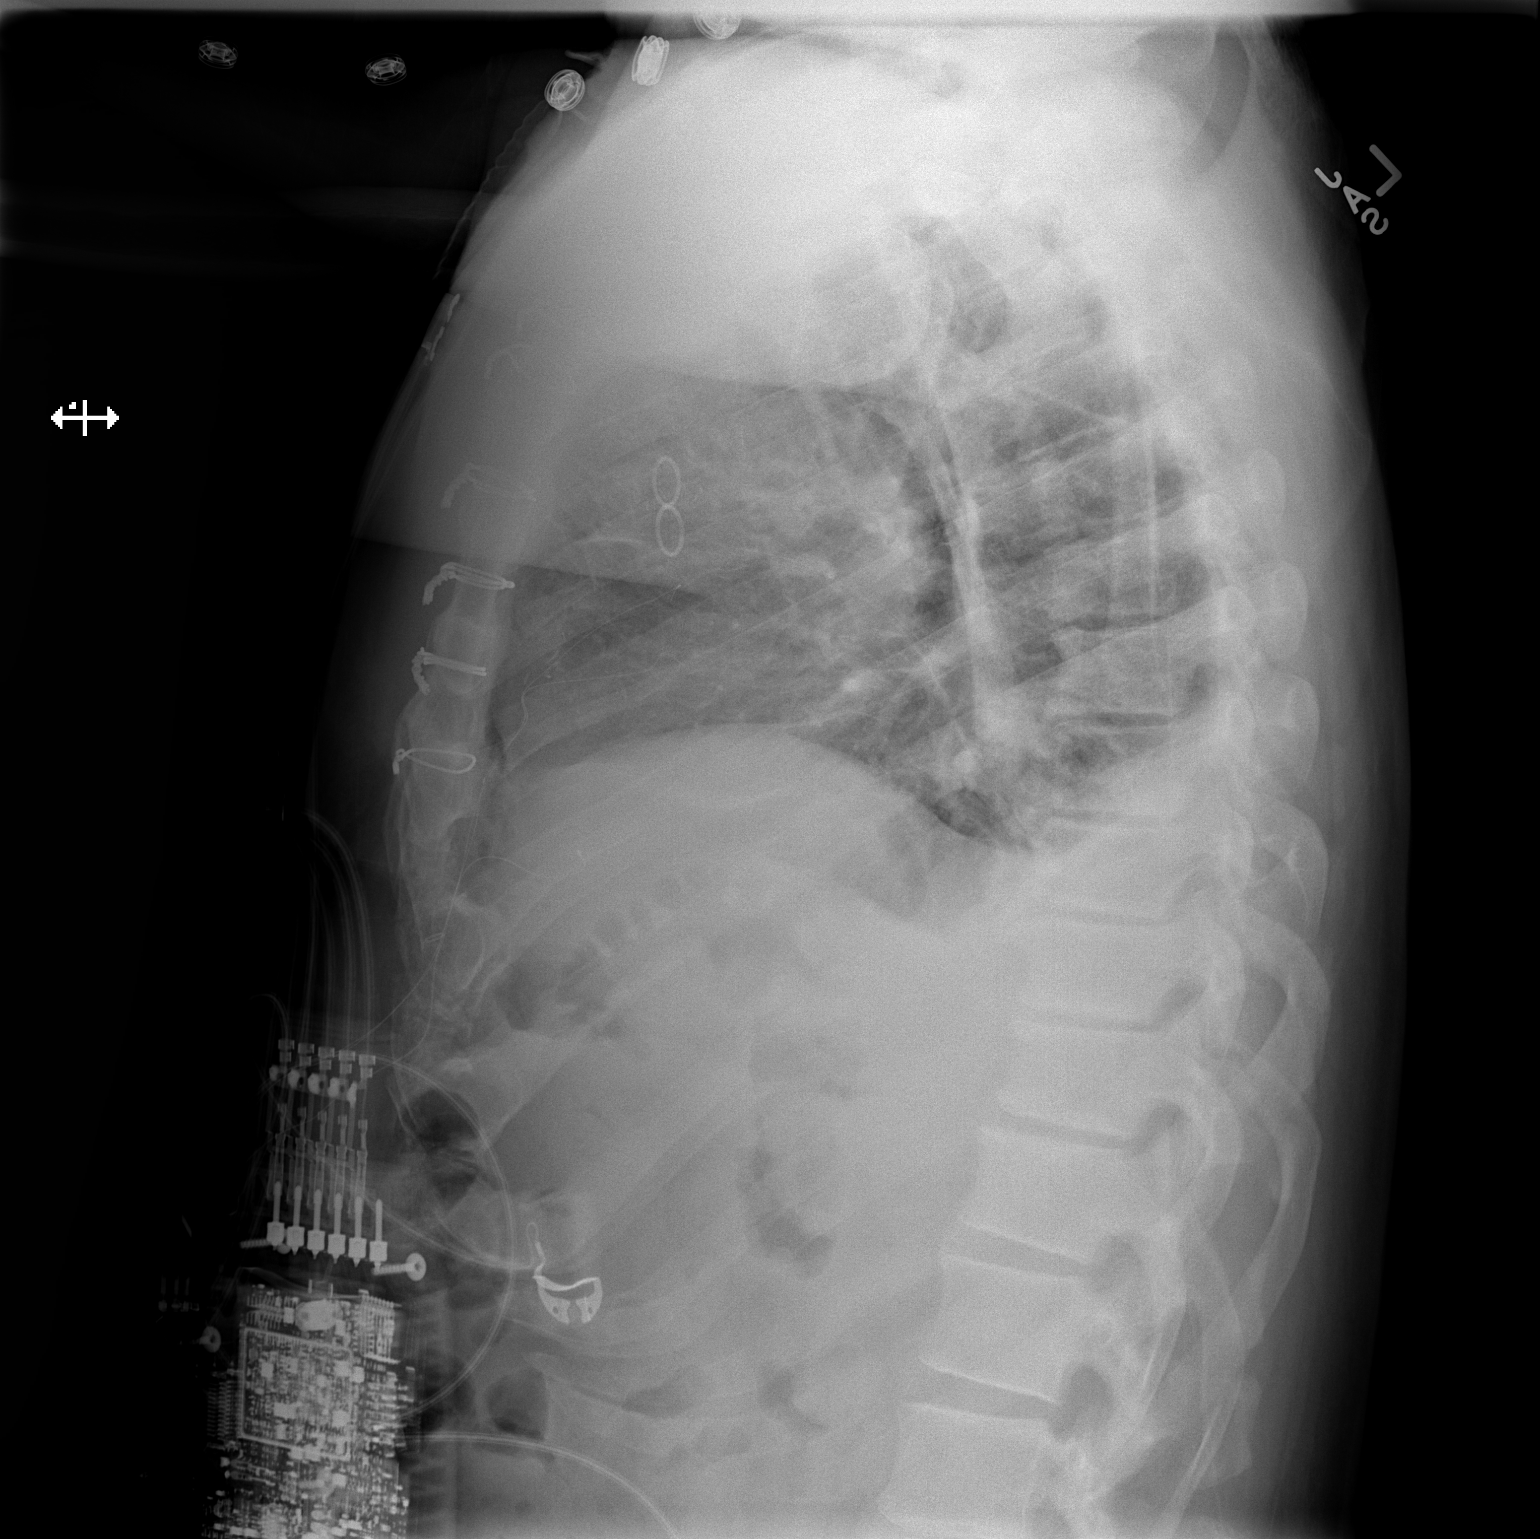

[2 of 2 positions shown; findings below may reference images not displayed]

FINDINGS: Sternotomy wires overlie normal cardiac silhouette. There is
improvement in the left lower lobe atelectasis seen on prior. No
pulmonary edema. No infiltrate or pneumothorax.
IMPRESSION: Improvement in left lower lobe atelectasis.

## 2016-01-19 ENCOUNTER — Encounter: Payer: Self-pay | Admitting: Internal Medicine

## 2016-01-19 LAB — LIPID PANEL
CHOLESTEROL: 133 mg/dL (ref <200)
HDL: 52 mg/dL (ref >40)
LDL CALC: 66 mg/dL (ref <100)
Total CHOL/HDL Ratio: 2.6 Ratio (ref <5.0)
Triglycerides: 73 mg/dL (ref <150)
VLDL: 15 mg/dL (ref <30)

## 2016-02-12 ENCOUNTER — Other Ambulatory Visit: Payer: Self-pay | Admitting: Family

## 2016-10-23 LAB — HM DIABETES EYE EXAM

## 2016-10-31 ENCOUNTER — Encounter: Payer: Self-pay | Admitting: Family

## 2017-05-17 ENCOUNTER — Encounter: Payer: Self-pay | Admitting: Family

## 2017-05-17 ENCOUNTER — Other Ambulatory Visit (INDEPENDENT_AMBULATORY_CARE_PROVIDER_SITE_OTHER): Payer: Medicare Other

## 2017-05-17 ENCOUNTER — Ambulatory Visit (INDEPENDENT_AMBULATORY_CARE_PROVIDER_SITE_OTHER): Payer: Medicare Other | Admitting: Family

## 2017-05-17 VITALS — BP 130/84 | HR 83 | Temp 98.2°F | Ht 69.0 in | Wt 181.1 lb

## 2017-05-17 DIAGNOSIS — Z114 Encounter for screening for human immunodeficiency virus [HIV]: Secondary | ICD-10-CM

## 2017-05-17 DIAGNOSIS — I10 Essential (primary) hypertension: Secondary | ICD-10-CM | POA: Diagnosis not present

## 2017-05-17 DIAGNOSIS — Z1211 Encounter for screening for malignant neoplasm of colon: Secondary | ICD-10-CM

## 2017-05-17 DIAGNOSIS — Z125 Encounter for screening for malignant neoplasm of prostate: Secondary | ICD-10-CM

## 2017-05-17 DIAGNOSIS — I214 Non-ST elevation (NSTEMI) myocardial infarction: Secondary | ICD-10-CM

## 2017-05-17 DIAGNOSIS — Z23 Encounter for immunization: Secondary | ICD-10-CM | POA: Diagnosis not present

## 2017-05-17 DIAGNOSIS — R7303 Prediabetes: Secondary | ICD-10-CM | POA: Diagnosis not present

## 2017-05-17 DIAGNOSIS — Z1159 Encounter for screening for other viral diseases: Secondary | ICD-10-CM

## 2017-05-17 DIAGNOSIS — E782 Mixed hyperlipidemia: Secondary | ICD-10-CM | POA: Diagnosis not present

## 2017-05-17 LAB — COMPREHENSIVE METABOLIC PANEL
ALK PHOS: 62 U/L (ref 39–117)
ALT: 16 U/L (ref 0–53)
AST: 18 U/L (ref 0–37)
Albumin: 4.3 g/dL (ref 3.5–5.2)
BILIRUBIN TOTAL: 0.9 mg/dL (ref 0.2–1.2)
BUN: 16 mg/dL (ref 6–23)
CO2: 28 meq/L (ref 19–32)
Calcium: 9.3 mg/dL (ref 8.4–10.5)
Chloride: 106 mEq/L (ref 96–112)
Creatinine, Ser: 1.3 mg/dL (ref 0.40–1.50)
GFR: 71.18 mL/min (ref >60.00)
GLUCOSE: 114 mg/dL — AB (ref 70–99)
POTASSIUM: 4.6 meq/L (ref 3.5–5.1)
SODIUM: 143 meq/L (ref 135–145)
TOTAL PROTEIN: 7.1 g/dL (ref 6.0–8.3)

## 2017-05-17 LAB — LIPID PANEL
Cholesterol: 232 mg/dL — ABNORMAL HIGH (ref 0–200)
HDL: 48.5 mg/dL (ref >39.00)
LDL Cholesterol: 167 mg/dL — ABNORMAL HIGH (ref 0–99)
NONHDL: 183.74
Total CHOL/HDL Ratio: 5
Triglycerides: 85 mg/dL (ref 0.0–149.0)
VLDL: 17 mg/dL (ref 0.0–40.0)

## 2017-05-17 LAB — CBC WITH DIFFERENTIAL/PLATELET
BASOS ABS: 0.1 10*3/uL (ref 0.0–0.1)
Basophils Relative: 0.9 % (ref 0.0–3.0)
EOS PCT: 1.7 % (ref 0.0–5.0)
Eosinophils Absolute: 0.1 10*3/uL (ref 0.0–0.7)
HEMATOCRIT: 48.3 % (ref 39.0–52.0)
Hemoglobin: 15.8 g/dL (ref 13.0–17.0)
LYMPHS ABS: 1.9 10*3/uL (ref 0.7–4.0)
LYMPHS PCT: 26.6 % (ref 12.0–46.0)
MCHC: 32.8 g/dL (ref 30.0–36.0)
MCV: 86.8 fl (ref 78.0–100.0)
MONOS PCT: 7.8 % (ref 3.0–12.0)
Monocytes Absolute: 0.5 10*3/uL (ref 0.1–1.0)
NEUTROS PCT: 63 % (ref 43.0–77.0)
Neutro Abs: 4.4 10*3/uL (ref 1.4–7.7)
Platelets: 154 10*3/uL (ref 150.0–400.0)
RBC: 5.57 Mil/uL (ref 4.22–5.81)
RDW: 14 % (ref 11.5–15.5)
WBC: 7 10*3/uL (ref 4.0–10.5)

## 2017-05-17 LAB — PSA: PSA: 2.57 ng/mL (ref 0.10–4.00)

## 2017-05-17 LAB — HEMOGLOBIN A1C: Hgb A1c MFr Bld: 6.4 % (ref 4.6–6.5)

## 2017-05-17 MED ORDER — ROSUVASTATIN CALCIUM 40 MG PO TABS: 40.0000 mg | ORAL_TABLET | Freq: Every day | ORAL | 1 refills | Status: DC

## 2017-05-17 MED ORDER — OMEPRAZOLE 20 MG PO CPDR: 20.0000 mg | DELAYED_RELEASE_CAPSULE | Freq: Every day | ORAL | 1 refills | Status: DC

## 2017-05-17 MED ORDER — LOSARTAN POTASSIUM 50 MG PO TABS: 50.0000 mg | ORAL_TABLET | Freq: Every day | ORAL | 1 refills | Status: DC

## 2017-05-17 MED ORDER — METOPROLOL SUCCINATE ER 50 MG PO TB24: 50.0000 mg | ORAL_TABLET | Freq: Every day | ORAL | 1 refills | Status: DC

## 2017-05-17 NOTE — Patient Instructions (Signed)
Schedule a follow-up with your eye doctor;

## 2017-05-17 NOTE — Progress Notes (Signed)
Angel Benson is a 67 y.o. male with the following history as recorded in Epic[employer]:  Patient Active Problem List   Diagnosis Date Noted  . Cardiomyopathy, ischemic 09/27/2014  . Routine general medical examination at a health care facility 02/01/2014  . S/P CABG x 5 08/26/2013  . Hyperlipidemia 08/25/2013  . CAD (coronary artery disease), native coronary artery 08/25/2013  . Benign essential HTN 08/25/2013  . DM2 (diabetes mellitus, type 2) ([place]) 08/25/2013  . NSTEMI (non-ST elevated myocardial infarction) ([place]) 08/24/2013    Current Outpatient Medications  Medication Sig Dispense Refill  . aspirin 81 MG tablet Take 81 mg by mouth daily.    Marland Kitchen latanoprost (XALATAN) 0.005 % ophthalmic solution Place 1 drop into both eyes at bedtime.    Marland Kitchen losartan (COZAAR) 50 MG tablet Take 1 tablet (50 mg total) by mouth daily. 90 tablet 1  . metoprolol succinate (TOPROL XL) 50 MG 24 hr tablet Take 1 tablet (50 mg total) by mouth daily. Take with or immediately following a meal. 90 tablet 1  . omeprazole (PRILOSEC) 20 MG capsule Take 1 capsule (20 mg total) by mouth daily. Use as needed for reflux 90 capsule 1  . rosuvastatin (CRESTOR) 40 MG tablet Take 1 tablet (40 mg total) by mouth daily. 90 tablet 1   No current facility-administered medications for this visit.     Allergies: Patient has no known allergies.  Past Medical History:  Diagnosis Date  . Allergy   . CAD, multiple vessel    s/p CABG  . Chicken pox   . Diabetes mellitus without complication (HCC)    Type 2  . GERD (gastroesophageal reflux disease)   . Hypercholesteremia   . Hypertension   . Urinary tract bacterial infections     Past Surgical History:  Procedure Laterality Date  . CARDIAC CATHETERIZATION    . CORONARY ARTERY BYPASS GRAFT N/A 08/26/2013   Procedure: CORONARY ARTERY BYPASS GRAFTING (CABG) times five, using left internal mammary artery, right and left greater saphenous vein;  Surgeon: Grace Isaac, MD;   Location: [place];  Service: Open Heart Surgery;  Laterality: N/A;  LIMA-LAD; SEQ SVG-OM1-OM2-OM3; SVG-RCA   . INTRAOPERATIVE TRANSESOPHAGEAL ECHOCARDIOGRAM N/A 08/26/2013   Procedure: INTRAOPERATIVE TRANSESOPHAGEAL ECHOCARDIOGRAM;  Surgeon: Grace Isaac, MD;  Location: [place];  Service: Open Heart Surgery;  Laterality: N/A;    Family History  Problem Relation Age of Onset  . Diabetes Mother   . Alzheimer's disease Mother   . Heart disease Father   . Diabetes Father   . Heart disease Brother     Social History   Tobacco Use  . Smoking status: Never Smoker  . Smokeless tobacco: Never Used  Substance Use Topics  . Alcohol use: No    Subjective:  Patient presents to re-establish care; has not been seen here since October 2017; off medications "for a while"- was taking care of his sister and lost track of his healthcare needs; history of HTN/ CAD/ NSTEMI/ Glaucoma; Denies any chest pain, shortness of breath, blurred vision or headache.   Objective:  Vitals:   05/17/17 0959  BP: 130/84  Pulse: 83  Temp: 98.2 F (36.8 C)  TempSrc: Oral  SpO2: 99%  Weight: 181 lb 1.3 oz (82.1 kg)  Height: _0  (1.753 m)    General: Well developed, well nourished, in no acute distress  Skin : Warm and dry.  Head: Normocephalic and atraumatic  Lungs: Respirations unlabored; clear to auscultation bilaterally without wheeze, rales, rhonchi  CVS exam: normal rate and regular rhythm.  Neurologic: Alert and oriented; speech intact; face symmetrical; moves all extremities well; CNII-XII intact without focal deficit  Assessment:  1. Essential hypertension   2. Mixed hyperlipidemia   3. Pre-diabetes   4. Prostate cancer screening   5. Encounter for hepatitis C screening test for low risk patient   6. Encounter for screening colonoscopy   7. Screening for HIV (human immunodeficiency virus)   8. NSTEMI (non-ST elevated myocardial infarction) ([place])     Plan:  1. Check CBC, CMP today; refill given  on Losartan and Toprol XL; follow-up in 1 month, sooner prn.  2. Check lipid panel; re-start Crestor; 3. Check Hgba1c; reviewed home blood sugar readings- averaging between 115-136; 4. Check PSA 5. Check Hep C screen; 6. Refer for baseline colonoscopy; 7. Check HIV status; 8. Refer back to cardiology- should be seeing once a year; Also encouraged to see his ophthalmologist regularly due to history of glaucoma;   No follow-ups on file.  Orders Placed This Encounter  Procedures  . Pneumococcal conjugate vaccine 13-valent  . CBC w/Diff    Standing Status:   Future    Number of Occurrences:   1    Standing Expiration Date:   05/17/2018  . Comp Met (CMET)    Standing Status:   Future    Number of Occurrences:   1    Standing Expiration Date:   05/17/2018  . Lipid panel    Standing Status:   Future    Number of Occurrences:   1    Standing Expiration Date:   05/18/2018  . PSA    Standing Status:   Future    Number of Occurrences:   1    Standing Expiration Date:   05/17/2018  . HgB A1c    Standing Status:   Future    Number of Occurrences:   1    Standing Expiration Date:   05/17/2018  . Hepatitis C Antibody    Standing Status:   Future    Number of Occurrences:   1    Standing Expiration Date:   05/17/2018  . HIV antibody  . Ambulatory referral to Gastroenterology    Referral Priority:   Routine    Referral Type:   Consultation    Referral Reason:   Specialty Services Required    Number of Visits Requested:   1  . Ambulatory referral to Cardiology    Referral Priority:   Routine    Referral Type:   Consultation    Referral Reason:   Specialty Services Required    Referred to Provider:   Pixie Casino, MD    Requested Specialty:   Cardiology    Number of Visits Requested:   1    Requested Prescriptions   Signed Prescriptions Disp Refills  . losartan (COZAAR) 50 MG tablet 90 tablet 1    Sig: Take 1 tablet (50 mg total) by mouth daily.  Marland Kitchen omeprazole (PRILOSEC) 20 MG capsule 90  capsule 1    Sig: Take 1 capsule (20 mg total) by mouth daily. Use as needed for reflux  . rosuvastatin (CRESTOR) 40 MG tablet 90 tablet 1    Sig: Take 1 tablet (40 mg total) by mouth daily.  . metoprolol succinate (TOPROL XL) 50 MG 24 hr tablet 90 tablet 1    Sig: Take 1 tablet (50 mg total) by mouth daily. Take with or immediately following a meal.

## 2017-05-20 ENCOUNTER — Telehealth (INDEPENDENT_AMBULATORY_CARE_PROVIDER_SITE_OTHER): Payer: Medicare Other

## 2017-05-20 LAB — HEPATITIS C ANTIBODY
HEP C AB: NONREACTIVE
SIGNAL TO CUT-OFF: 0.03 (ref <1.00)

## 2017-05-21 ENCOUNTER — Encounter: Payer: Self-pay | Admitting: Gastroenterology

## 2017-05-24 ENCOUNTER — Ambulatory Visit (INDEPENDENT_AMBULATORY_CARE_PROVIDER_SITE_OTHER): Payer: Medicare Other | Admitting: Internal Medicine

## 2017-05-24 ENCOUNTER — Encounter: Payer: Self-pay | Admitting: Internal Medicine

## 2017-05-24 VITALS — BP 126/74 | HR 77 | Ht 69.0 in | Wt 181.0 lb

## 2017-05-24 DIAGNOSIS — E785 Hyperlipidemia, unspecified: Secondary | ICD-10-CM

## 2017-05-24 DIAGNOSIS — I2510 Atherosclerotic heart disease of native coronary artery without angina pectoris: Secondary | ICD-10-CM

## 2017-05-24 DIAGNOSIS — Z951 Presence of aortocoronary bypass graft: Secondary | ICD-10-CM | POA: Diagnosis not present

## 2017-05-24 DIAGNOSIS — I10 Essential (primary) hypertension: Secondary | ICD-10-CM | POA: Diagnosis not present

## 2017-05-24 NOTE — Patient Instructions (Signed)
Your physician wants you to follow-up in: ONE YEAR with Dr. Hilty. You will receive a reminder letter in the mail two months in advance. If you don't receive a letter, please call our office to schedule the follow-up appointment.  

## 2017-05-24 NOTE — Progress Notes (Signed)
05/24/2017   PCP: [name], FNP   Chief Complaint  Patient presents with  . Follow-up    Primary Cardiologist:Dr. Alma Friendly  HPI: 67 year old AA male with no prior cardiac hx was admitted to [facility] 08/24/13 with chest pain, + NSTEMI.  Cardiac cath with severe triple vessel CAD with total occlusion of the distal RCA, severe proximal LAD stenosis involving a long segment, occluded distal LAD, high grade disease in all three obtuse marginal branches and the mid Circumflex.  Mild segmental LV systolic dysfunction.  He then underwent PROCEDURE: Procedure(s):   CORONARY ARTERY BYPASS GRAFTING (CABG)X5 LIMA-LAD; SEQ SVG-OM1-OM2-OM3; SVG-RCA  INTRAOPERATIVE TRANSESOPHAGEAL ECHOCARDIOGRAM EVH RIGHT THIGH OVH RIGHT LOWER AND LEFT LOWER LEG by Dr. Servando Snare 08/26/13.  Pt did well post op, no atrial fib and progressed with out complications.  Since discharge he has done well no complaint today.  Walking 20 min a day- a mile or so.  occ surgical chest pain.  Eating healthy.    Passage has no complaints today. He follows up and is doing well. He started cardiac rehabilitation and is doing well with his exercise. Denies any chest pain. He was supposed to be on lisinopril however the prescription was never placed. We did go ahead and start low-dose lisinopril and he is taking it without complications.   Mr. Morain returns today for follow-up. Overall he is doing very well. He denies any chest pain or worsening shortness of breath. It was noted that during his catheterization EF is reduced  To 45-50%. This is not been reassessed postoperatively. He is also not had a check of his cholesterol in some time.  10/20/2015  Mr. Schubring returns today for follow-up. He reports doing well and denies any chest pain or worsening shortness of breath.He saw his primary care provider in August of this year and had blood work including a lipid panel For which she is total cholesterol was 217 however LDL remained  very high at 154. This is on low-dose simvastatin. Based on prescribing regulations by the FDA, we cannot further increase his dose of simvastatin. In fact he needs to be on a high-dose statin and I would recommend switching to Crestor 40 mg daily. After his last office visit we repeated his echocardiogram which showed normalization of LV function. This indicates successful revascularization. Blood pressure is well-controlled today.  05/24/2017  Mr. Quiles was seen today in follow-up.  Is been a couple years since we last saw him.  Overall he is feeling well, but he freely admits that he has been off of his medicines for several months.  Unfortunately his sister died recently of cancer.  He saw his primary care provider who restarted his medicines and he started taking them.  Blood pressure is well controlled today.  His cholesterol profile, not surprisingly is abnormal, being reflective of being off of his Crestor.  Total cholesterol 232, LDL of 167.  Despite this he denied any chest pain or worsening shortness of breath.   No Known Allergies  Current Outpatient Medications  Medication Sig Dispense Refill  . aspirin 81 MG tablet Take 81 mg by mouth daily.    Marland Kitchen latanoprost (XALATAN) 0.005 % ophthalmic solution Place 1 drop into both eyes at bedtime.    Marland Kitchen losartan (COZAAR) 50 MG tablet Take 1 tablet (50 mg total) by mouth daily. 90 tablet 1  . metoprolol succinate (TOPROL XL) 50 MG 24 hr tablet Take 1 tablet (50 mg total) by mouth  daily. Take with or immediately following a meal. 90 tablet 1  . omeprazole (PRILOSEC) 20 MG capsule Take 1 capsule (20 mg total) by mouth daily. Use as needed for reflux 90 capsule 1  . rosuvastatin (CRESTOR) 40 MG tablet Take 1 tablet (40 mg total) by mouth daily. 90 tablet 1   No current facility-administered medications for this visit.     Past Medical History:  Diagnosis Date  . Allergy   . CAD, multiple vessel    s/p CABG  . Chicken pox   . Diabetes mellitus  without complication (HCC)    Type 2  . GERD (gastroesophageal reflux disease)   . Hypercholesteremia   . Hypertension   . Urinary tract bacterial infections     Past Surgical History:  Procedure Laterality Date  . CARDIAC CATHETERIZATION    . CORONARY ARTERY BYPASS GRAFT N/A 08/26/2013   Procedure: CORONARY ARTERY BYPASS GRAFTING (CABG) times five, using left internal mammary artery, right and left greater saphenous vein;  Surgeon: Grace Isaac, MD;  Location: [place];  Service: Open Heart Surgery;  Laterality: N/A;  LIMA-LAD; SEQ SVG-OM1-OM2-OM3; SVG-RCA   . INTRAOPERATIVE TRANSESOPHAGEAL ECHOCARDIOGRAM N/A 08/26/2013   Procedure: INTRAOPERATIVE TRANSESOPHAGEAL ECHOCARDIOGRAM;  Surgeon: Grace Isaac, MD;  Location: [place];  Service: Open Heart Surgery;  Laterality: N/A;    ROS: Pertinent items noted in HPI and remainder of comprehensive ROS otherwise negative.   Wt Readings from Last 3 Encounters:  05/24/17 181 lb (82.1 kg)  05/17/17 181 lb 1.3 oz (82.1 kg)  10/20/15 178 lb 12.8 oz (81.1 kg)    PHYSICAL EXAM BP 126/74 (BP Location: Left Arm, Patient Position: Sitting, Cuff Size: Normal)   Pulse 77   Ht 5\' 9"  (1.753 m)   Wt 181 lb (82.1 kg)   BMI 26.73 kg/m  General appearance: alert and no distress Neck: no carotid bruit, no JVD and thyroid not enlarged, symmetric, no tenderness/mass/nodules Lungs: clear to auscultation bilaterally Heart: regular rate and rhythm, S1, S2 normal, no murmur, click, rub or gallop Abdomen: soft, non-tender; bowel sounds normal; no masses,  no organomegaly Extremities: extremities normal, atraumatic, no cyanosis or edema Pulses: 2+ and symmetric Skin: Skin color, texture, turgor normal. No rashes or lesions Neurologic: Grossly normal Psych: Pleasant  EKG: Sinus rhythm at 77, possible left atrial enlargement-personally reviewed  ASSESSMENT AND PLAN Patient Active Problem List   Diagnosis Date Noted  . Cardiomyopathy, ischemic  09/27/2014  . Routine general medical examination at a health care facility 02/01/2014  . S/P CABG x 5 08/26/2013  . Hyperlipidemia 08/25/2013  . CAD (coronary artery disease), native coronary artery 08/25/2013  . Benign essential HTN 08/25/2013  . DM2 (diabetes mellitus, type 2) ([place]) 08/25/2013  . NSTEMI (non-ST elevated myocardial infarction) ([place]) 08/24/2013   PLAN: 1.  Mr. Sereno is doing well symptomatically.  His LVEF had normalized.  He had 5 vessel bypass in 2015.  He will need to remain on his heart medicines indefinitely.  Unfortunately he became noncompliant with them, mostly dealing with his sister who is dying.  He has restarted his medications.  He is cholesterol will likely improve and should be reassessed in 3 to 4 months.  No changes to his medications today.  Follow-up annually or sooner as necessary.  Pixie Casino, MD, [facility], [place] Director of the Advanced Lipid Disorders &  Cardiovascular Risk Reduction Clinic Diplomate of the American Board of Clinical Lipidology Attending Cardiologist  Direct Dial: [phone]  Fax: [phone]  Website:  [email or web address]

## 2017-06-20 ENCOUNTER — Ambulatory Visit (INDEPENDENT_AMBULATORY_CARE_PROVIDER_SITE_OTHER): Payer: Medicare Other | Admitting: Family

## 2017-06-20 ENCOUNTER — Encounter: Payer: Self-pay | Admitting: Family

## 2017-06-20 VITALS — BP 124/70 | HR 66 | Temp 98.1°F | Ht 69.0 in | Wt 183.5 lb

## 2017-06-20 DIAGNOSIS — I10 Essential (primary) hypertension: Secondary | ICD-10-CM | POA: Diagnosis not present

## 2017-06-20 DIAGNOSIS — E785 Hyperlipidemia, unspecified: Secondary | ICD-10-CM

## 2017-06-20 NOTE — Progress Notes (Signed)
Angel Benson is a 67 y.o. male with the following history as recorded in Epic[employer]:  Patient Active Problem List   Diagnosis Date Noted  . Cardiomyopathy, ischemic 09/27/2014  . Routine general medical examination at a health care facility 02/01/2014  . S/P CABG x 5 08/26/2013  . Hyperlipidemia 08/25/2013  . Coronary artery disease involving native coronary artery of native heart without angina pectoris 08/25/2013  . Benign essential HTN 08/25/2013  . DM2 (diabetes mellitus, type 2) ([place]) 08/25/2013  . NSTEMI (non-ST elevated myocardial infarction) ([place]) 08/24/2013    Current Outpatient Medications  Medication Sig Dispense Refill  . aspirin 81 MG tablet Take 81 mg by mouth daily.    Marland Kitchen latanoprost (XALATAN) 0.005 % ophthalmic solution Place 1 drop into both eyes at bedtime.    Marland Kitchen losartan (COZAAR) 50 MG tablet Take 1 tablet (50 mg total) by mouth daily. 90 tablet 1  . metoprolol succinate (TOPROL XL) 50 MG 24 hr tablet Take 1 tablet (50 mg total) by mouth daily. Take with or immediately following a meal. 90 tablet 1  . rosuvastatin (CRESTOR) 40 MG tablet Take 1 tablet (40 mg total) by mouth daily. 90 tablet 1   No current facility-administered medications for this visit.     Allergies: Patient has no known allergies.  Past Medical History:  Diagnosis Date  . Allergy   . CAD, multiple vessel    s/p CABG  . Chicken pox   . Diabetes mellitus without complication (HCC)    Type 2  . GERD (gastroesophageal reflux disease)   . Hypercholesteremia   . Hypertension   . Urinary tract bacterial infections     Past Surgical History:  Procedure Laterality Date  . CARDIAC CATHETERIZATION    . CORONARY ARTERY BYPASS GRAFT N/A 08/26/2013   Procedure: CORONARY ARTERY BYPASS GRAFTING (CABG) times five, using left internal mammary artery, right and left greater saphenous vein;  Surgeon: Grace Isaac, MD;  Location: [facility];  Service: Open Heart Surgery;  Laterality: N/A;  LIMA-LAD; SEQ  SVG-OM1-OM2-OM3; SVG-RCA   . INTRAOPERATIVE TRANSESOPHAGEAL ECHOCARDIOGRAM N/A 08/26/2013   Procedure: INTRAOPERATIVE TRANSESOPHAGEAL ECHOCARDIOGRAM;  Surgeon: Grace Isaac, MD;  Location: [facility];  Service: Open Heart Surgery;  Laterality: N/A;    Family History  Problem Relation Age of Onset  . Diabetes Mother   . Alzheimer's disease Mother   . Heart disease Father   . Diabetes Father   . Heart disease Brother     Social History   Tobacco Use  . Smoking status: Never Smoker  . Smokeless tobacco: Never Used  Substance Use Topics  . Alcohol use: No    Subjective:  1 month follow-up on hypertension/ hyperlipidemia; tolerating his medications well; Denies any chest pain, shortness of breath, blurred vision or headache; denies any myalgias on Crestor; has seen his cardiologist; scheduled to see ophthalmology and GI in the next few weeks;    Objective:  Vitals:   06/20/17 0959  BP: 124/70  Pulse: 66  Temp: 98.1 F (36.7 C)  TempSrc: Oral  SpO2: 98%  Weight: 183 lb 8 oz (83.2 kg)  Height: 5\' 9"  (1.753 m)    General: Well developed, well nourished, in no acute distress  Skin : Warm and dry.  Head: Normocephalic and atraumatic  Lungs: Respirations unlabored; clear to auscultation bilaterally without wheeze, rales, rhonchi  CVS exam: normal rate and regular rhythm.  Neurologic: Alert and oriented; speech intact; face symmetrical; moves all extremities well; CNII-XII intact without  focal deficit  Assessment:  1. Benign essential HTN   2. Hyperlipidemia, unspecified hyperlipidemia type     Plan:  Stable; good response to medication; plan to update labs, urine microalbumin at next OV; follow-up in 6 months, sooner prn.  Return in about 6 months (around 12/20/2017) for 6 months follow-up .  No orders of the defined types were placed in this encounter.   Requested Prescriptions    No prescriptions requested or ordered in this encounter

## 2017-06-26 NOTE — Telephone Encounter (Signed)
Taken care of already. Old encounter.

## 2017-07-15 ENCOUNTER — Ambulatory Visit (INDEPENDENT_AMBULATORY_CARE_PROVIDER_SITE_OTHER): Payer: Medicare Other | Admitting: Gastroenterology

## 2017-07-15 ENCOUNTER — Encounter: Payer: Self-pay | Admitting: Gastroenterology

## 2017-07-15 VITALS — BP 116/72 | HR 68 | Ht 69.0 in | Wt 185.0 lb

## 2017-07-15 DIAGNOSIS — Z1211 Encounter for screening for malignant neoplasm of colon: Secondary | ICD-10-CM

## 2017-07-15 DIAGNOSIS — I2510 Atherosclerotic heart disease of native coronary artery without angina pectoris: Secondary | ICD-10-CM | POA: Diagnosis not present

## 2017-07-15 MED ORDER — NA SULFATE-K SULFATE-MG SULF 17.5-3.13-1.6 GM/177ML PO SOLN: 1.0000 | Freq: Once | ORAL | 0 refills | Status: AC

## 2017-07-15 NOTE — Patient Instructions (Signed)
If you are age 65 or older, your body mass index should be between 23-30. Your Body mass index is 27.32 kg/m. If this is out of the aforementioned range listed, please consider follow up with your Primary Care Provider.  If you are age 23 or younger, your body mass index should be between 19-25. Your Body mass index is 27.32 kg/m. If this is out of the aformentioned range listed, please consider follow up with your Primary Care Provider.   You have been scheduled for a colonoscopy. Please follow written instructions given to you at your visit today.  Please pick up your prep supplies at the pharmacy within the next 1-3 days. If you use inhalers (even only as needed), please bring them with you on the day of your procedure. Your physician has requested that you go to www.startemmi.com and enter the access code given to you at your visit today. This web site gives a general overview about your procedure. However, you should still follow specific instructions given to you by our office regarding your preparation for the procedure.  It was a pleasure to meet you today!  Dr. Loletha Carrow

## 2017-07-15 NOTE — Progress Notes (Signed)
[facility] Consult Note:  History: Angel Benson 07/15/2017  Referring physician: [name], [place]  Reason for consult/chief complaint: Colon Cancer Screening (First colon, patient has no compliants) and Abdominal Pain (muscle pain when starting to stand)   Subjective  HPI:  This is a very pleasant 67 year old man referred for screening colonoscopy.  When contacted, our office staff got the impression he was having chronic abdominal pain and thus he was brought for visit.  He clarifies by saying that when he was working and doing a lot of lifting, he would sometimes get abdominal wall pain at the end of the day.  He does not have chronic abdominal pain anymore, he has regular bowel movements without bleeding, denies heartburn or dysphagia.   ROS:  Review of Systems  He denies chest pain dyspnea or dysuria  Past Medical History: Past Medical History:  Diagnosis Date  . Allergy   . CAD, multiple vessel    s/p CABG  . Chicken pox   . Diabetes mellitus without complication (HCC)    Type 2  . GERD (gastroesophageal reflux disease)   . Hypercholesteremia   . Hypertension   . Urinary tract bacterial infections      Past Surgical History: Past Surgical History:  Procedure Laterality Date  . CARDIAC CATHETERIZATION    . CORONARY ARTERY BYPASS GRAFT N/A 08/26/2013   Procedure: CORONARY ARTERY BYPASS GRAFTING (CABG) times five, using left internal mammary artery, right and left greater saphenous vein;  Surgeon: Grace Isaac, MD;  Location: [facility];  Service: Open Heart Surgery;  Laterality: N/A;  LIMA-LAD; SEQ SVG-OM1-OM2-OM3; SVG-RCA   . INTRAOPERATIVE TRANSESOPHAGEAL ECHOCARDIOGRAM N/A 08/26/2013   Procedure: INTRAOPERATIVE TRANSESOPHAGEAL ECHOCARDIOGRAM;  Surgeon: Grace Isaac, MD;  Location: [facility];  Service: Open Heart Surgery;  Laterality: N/A;   I reviewed his cardiologist's most recent office note, describing that the patient is  doing well without angina  Family History: Family History  Problem Relation Age of Onset  . Diabetes Mother   . Alzheimer's disease Mother   . Heart disease Father   . Diabetes Father   . Heart disease Brother   no CRC   Social History: Social History   Socioeconomic History  . Marital status: Divorced    Spouse name: Not on file  . Number of children: 1  . Years of education: 31  . Highest education level: Not on file  Occupational History  . Occupation: Retired - Physicist, medical  Social Needs  . Financial resource strain: Not on file  . Food insecurity:    Worry: Not on file    Inability: Not on file  . Transportation needs:    Medical: Not on file    Non-medical: Not on file  Tobacco Use  . Smoking status: Never Smoker  . Smokeless tobacco: Never Used  Substance and Sexual Activity  . Alcohol use: No  . Drug use: No  . Sexual activity: Not on file  Lifestyle  . Physical activity:    Days per week: Not on file    Minutes per session: Not on file  . Stress: Not on file  Relationships  . Social connections:    Talks on phone: Not on file    Gets together: Not on file    Attends religious service: Not on file    Active member of club or organization: Not on file    Attends meetings of clubs or organizations: Not on file    Relationship  status: Not on file  Other Topics Concern  . Not on file  Social History Narrative   Born in [place] and raised in [place]. Currently resides in an apartment with his sister. Fun: Basketball   Denies religious beliefs effecting healthcare.     Allergies: No Known Allergies  Outpatient Meds: Current Outpatient Medications  Medication Sig Dispense Refill  . aspirin 81 MG tablet Take 81 mg by mouth daily.    . ferrous sulfate 325 (65 FE) MG tablet Take 325 mg by mouth daily with breakfast.    . latanoprost (XALATAN) 0.005 % ophthalmic solution Place 1 drop into both eyes at bedtime.    Marland Kitchen losartan (COZAAR) 50 MG tablet  Take 1 tablet (50 mg total) by mouth daily. 90 tablet 1  . metoprolol succinate (TOPROL XL) 50 MG 24 hr tablet Take 1 tablet (50 mg total) by mouth daily. Take with or immediately following a meal. 90 tablet 1  . Multiple Vitamin (MULTIVITAMIN) tablet Take 1 tablet by mouth daily.    . Omega-3 Fatty Acids (FISH OIL) 1000 MG CAPS Take by mouth.    . rosuvastatin (CRESTOR) 40 MG tablet Take 1 tablet (40 mg total) by mouth daily. 90 tablet 1  . Na Sulfate-K Sulfate-Mg Sulf 17.5-3.13-1.6 GM/177ML SOLN Take 1 kit by mouth once for 1 dose. 354 mL 0   No current facility-administered medications for this visit.       ___________________________________________________________________ Objective   Exam:  BP 116/72   Pulse 68   Ht 5' 9"  (1.753 m)   Wt 185 lb (83.9 kg)   BMI 27.32 kg/m    General: this is a(n) well-appearing man  Eyes: sclera anicteric, no redness  ENT: oral mucosa moist without lesions, no cervical or supraclavicular lymphadenopathy, good dentition  CV: RRR without murmur, S1/S2, no JVD, no peripheral edema  Resp: clear to auscultation bilaterally, normal RR and effort noted  GI: soft, no tenderness, with active bowel sounds. No guarding or palpable organomegaly noted.  Skin; warm and dry, no rash or jaundice noted  Neuro: awake, alert and oriented x 3. Normal gross motor function and fluent speech  Labs:  Last echo 2016, EF 55%   Assessment: Encounter Diagnoses  Name Primary?  . Special screening for malignant neoplasms, colon Yes  . Coronary artery disease involving native coronary artery of native heart without angina pectoris     Average risk for colorectal cancer, well enough to have colonoscopy done.  We discussed the nature that procedure including risks and benefits and he is agreeable.  Total 20-minute time including chart review, over half spent in direct face-to-face conversation with patient regarding colon cancer screening and specifically  colonoscopy..  Thank you for the courtesy of this consult.  Please call me with any questions or concerns.  Nelida Meuse III  CC: [name], [place]

## 2017-07-24 ENCOUNTER — Ambulatory Visit (INDEPENDENT_AMBULATORY_CARE_PROVIDER_SITE_OTHER): Payer: Medicare Other | Admitting: *Deleted

## 2017-07-24 DIAGNOSIS — Z23 Encounter for immunization: Secondary | ICD-10-CM

## 2017-08-20 ENCOUNTER — Encounter: Payer: Self-pay | Admitting: Gastroenterology

## 2017-08-20 ENCOUNTER — Ambulatory Visit (AMBULATORY_SURGERY_CENTER): Payer: Medicare Other | Admitting: Gastroenterology

## 2017-08-20 VITALS — BP 131/72 | HR 83 | Temp 98.7°F | Resp 25 | Ht 69.0 in | Wt 185.0 lb

## 2017-08-20 DIAGNOSIS — Z1211 Encounter for screening for malignant neoplasm of colon: Secondary | ICD-10-CM | POA: Diagnosis present

## 2017-08-20 DIAGNOSIS — D123 Benign neoplasm of transverse colon: Secondary | ICD-10-CM

## 2017-08-20 MED ORDER — SODIUM CHLORIDE 0.9 % IV SOLN: 500.0000 mL | Freq: Once | INTRAVENOUS | Status: DC

## 2017-08-20 NOTE — Progress Notes (Signed)
Report given to PACU, vss 

## 2017-08-20 NOTE — Patient Instructions (Signed)
Discharge instructions given. Handouts on polyps and diverticulosis. Resume previous medications. YOU HAD AN ENDOSCOPIC PROCEDURE TODAY AT THE [facility]:   Refer to the procedure report that was given to you for any specific questions about what was found during the examination.  If the procedure report does not answer your questions, please call your gastroenterologist to clarify.  If you requested that your care partner not be given the details of your procedure findings, then the procedure report has been included in a sealed envelope for you to review at your convenience later.  YOU SHOULD EXPECT: Some feelings of bloating in the abdomen. Passage of more gas than usual.  Walking can help get rid of the air that was put into your GI tract during the procedure and reduce the bloating. If you had a lower endoscopy (such as a colonoscopy or flexible sigmoidoscopy) you may notice spotting of blood in your stool or on the toilet paper. If you underwent a bowel prep for your procedure, you may not have a normal bowel movement for a few days.  Please Note:  You might notice some irritation and congestion in your nose or some drainage.  This is from the oxygen used during your procedure.  There is no need for concern and it should clear up in a day or so.  SYMPTOMS TO REPORT IMMEDIATELY:   Following lower endoscopy (colonoscopy or flexible sigmoidoscopy):  Excessive amounts of blood in the stool  Significant tenderness or worsening of abdominal pains  Swelling of the abdomen that is new, acute  Fever of 100F or higher   For urgent or emergent issues, a gastroenterologist can be reached at any hour by calling [phone].   DIET:  We do recommend a small meal at first, but then you may proceed to your regular diet.  Drink plenty of fluids but you should avoid alcoholic beverages for 24 hours.  ACTIVITY:  You should plan to take it easy for the rest of today and you should NOT  DRIVE or use heavy machinery until tomorrow (because of the sedation medicines used during the test).    FOLLOW UP: Our staff will call the number listed on your records the next business day following your procedure to check on you and address any questions or concerns that you may have regarding the information given to you following your procedure. If we do not reach you, we will leave a message.  However, if you are feeling well and you are not experiencing any problems, there is no need to return our call.  We will assume that you have returned to your regular daily activities without incident.  If any biopsies were taken you will be contacted by phone or by letter within the next 1-3 weeks.  Please call us at [phone] if you have not heard about the biopsies in 3 weeks.    SIGNATURES/CONFIDENTIALITY: You and/or your care partner have signed paperwork which will be entered into your electronic medical record.  These signatures attest to the fact that that the information above on your After Visit Summary has been reviewed and is understood.  Full responsibility of the confidentiality of this discharge information lies with you and/or your care-partner. 

## 2017-08-20 NOTE — Progress Notes (Signed)
Called to room to assist during endoscopic procedure.  Patient ID and intended procedure confirmed with present staff. Received instructions for my participation in the procedure from the performing physician.  

## 2017-08-20 NOTE — Op Note (Signed)
[facility] Patient Name: Angel Benson Procedure Date: 08/20/2017 2:41 PM MRN: [id number] Endoscopist: Mallie Mussel L. Loletha Carrow , MD Age: 67 Referring MD:  Date of Birth: [birth year] Gender: Male Account #: [id number] Procedure:                Colonoscopy Indications:              Screening for colorectal malignant neoplasm, This                            is the patient's first colonoscopy Medicines:                Monitored Anesthesia Care Procedure:                Pre-Anesthesia Assessment:                           - Prior to the procedure, a History and Physical                            was performed, and patient medications and                            allergies were reviewed. The patient's tolerance of                            previous anesthesia was also reviewed. The risks                            and benefits of the procedure and the sedation                            options and risks were discussed with the patient.                            All questions were answered, and informed consent                            was obtained. Anticoagulants: The patient has taken                            aspirin. It was decided not to withhold this                            medication prior to the procedure. ASA Grade                            Assessment: II - A patient with mild systemic                            disease. After reviewing the risks and benefits,                            the patient was deemed in satisfactory condition to  undergo the procedure.                           After obtaining informed consent, the colonoscope                            was passed under direct vision. Throughout the                            procedure, the patient's blood pressure, pulse, and                            oxygen saturations were monitored continuously. The                            Colonoscope was introduced through the anus and                         advanced to the the cecum, identified by                            appendiceal orifice and ileocecal valve. The                            colonoscopy was performed without difficulty. The                            patient tolerated the procedure well. The quality                            of the bowel preparation was excellent. The                            ileocecal valve, appendiceal orifice, and rectum                            were photographed. Scope In: 2:47:59 PM Scope Out: 2:58:36 PM Scope Withdrawal Time: 0 hours 8 minutes 47 seconds  Total Procedure Duration: 0 hours 10 minutes 37 seconds  Findings:                 The perianal and digital rectal examinations were                            normal.                           A few medium-mouthed diverticula were found in the                            left colon.                           A 3 mm polyp was found in the transverse colon. The  polyp was sessile. The polyp was removed with a                            cold snare. Resection and retrieval were complete.                           The exam was otherwise without abnormality on                            direct and retroflexion views. Complications:            No immediate complications. Estimated Blood Loss:     Estimated blood loss was minimal. Impression:               - Diverticulosis in the left colon.                           - One 3 mm polyp in the transverse colon, removed                            with a cold snare. Resected and retrieved.                           - The examination was otherwise normal on direct                            and retroflexion views. Recommendation:           - Patient has a contact number available for                            emergencies. The signs and symptoms of potential                            delayed complications were discussed with the                             patient. Return to normal activities tomorrow.                            Written discharge instructions were provided to the                            patient.                           - Resume previous diet.                           - Continue present medications.                           - Await pathology results.                           - Repeat colonoscopy is recommended for  surveillance. The colonoscopy date will be                            determined after pathology results from today's                            exam become available for review. Henry L. Loletha Carrow, MD 08/20/2017 3:02:01 PM This report has been signed electronically.

## 2017-08-21 ENCOUNTER — Telehealth: Payer: Self-pay | Admitting: *Deleted

## 2017-08-21 NOTE — Telephone Encounter (Signed)
  Follow up Call-  Call back number 08/20/2017  Post procedure Call Back phone  # [phone]  Permission to leave phone message Yes  Some recent data might be hidden     Patient questions:  Do you have a fever, pain , or abdominal swelling? No. Pain Score  0 *  Have you tolerated food without any problems? Yes.    Have you been able to return to your normal activities? Yes.    Do you have any questions about your discharge instructions: Diet   No. Medications  No. Follow up visit  No.  Do you have questions or concerns about your Care? No.  Actions: * If pain score is 4 or above: No action needed, pain <4.

## 2017-08-23 ENCOUNTER — Encounter: Payer: Self-pay | Admitting: Gastroenterology

## 2017-10-25 ENCOUNTER — Ambulatory Visit: Payer: Medicare Other

## 2017-11-05 ENCOUNTER — Ambulatory Visit (INDEPENDENT_AMBULATORY_CARE_PROVIDER_SITE_OTHER): Payer: Medicare Other

## 2017-11-05 DIAGNOSIS — Z23 Encounter for immunization: Secondary | ICD-10-CM

## 2017-12-03 ENCOUNTER — Other Ambulatory Visit: Payer: Self-pay | Admitting: Family

## 2017-12-20 ENCOUNTER — Encounter: Payer: Self-pay | Admitting: Family

## 2017-12-20 ENCOUNTER — Other Ambulatory Visit (INDEPENDENT_AMBULATORY_CARE_PROVIDER_SITE_OTHER): Payer: Medicare Other

## 2017-12-20 ENCOUNTER — Ambulatory Visit (INDEPENDENT_AMBULATORY_CARE_PROVIDER_SITE_OTHER): Payer: Medicare Other | Admitting: Family

## 2017-12-20 VITALS — BP 120/70 | HR 75 | Temp 97.7°F | Ht 69.0 in | Wt 185.0 lb

## 2017-12-20 DIAGNOSIS — R7303 Prediabetes: Secondary | ICD-10-CM

## 2017-12-20 DIAGNOSIS — I10 Essential (primary) hypertension: Secondary | ICD-10-CM

## 2017-12-20 DIAGNOSIS — E785 Hyperlipidemia, unspecified: Secondary | ICD-10-CM

## 2017-12-20 DIAGNOSIS — Z23 Encounter for immunization: Secondary | ICD-10-CM

## 2017-12-20 LAB — CBC WITH DIFFERENTIAL/PLATELET
Basophils Absolute: 0.1 10*3/uL (ref 0.0–0.1)
Basophils Relative: 0.7 % (ref 0.0–3.0)
Eosinophils Absolute: 0.3 10*3/uL (ref 0.0–0.7)
Eosinophils Relative: 3.7 % (ref 0.0–5.0)
HCT: 46.3 % (ref 39.0–52.0)
Hemoglobin: 15.2 g/dL (ref 13.0–17.0)
LYMPHS ABS: 2.2 10*3/uL (ref 0.7–4.0)
Lymphocytes Relative: 28.5 % (ref 12.0–46.0)
MCHC: 32.9 g/dL (ref 30.0–36.0)
MCV: 86.2 fl (ref 78.0–100.0)
Monocytes Absolute: 0.7 10*3/uL (ref 0.1–1.0)
Monocytes Relative: 9.6 % (ref 3.0–12.0)
NEUTROS ABS: 4.4 10*3/uL (ref 1.4–7.7)
NEUTROS PCT: 57.5 % (ref 43.0–77.0)
PLATELETS: 146 10*3/uL — AB (ref 150.0–400.0)
RBC: 5.37 Mil/uL (ref 4.22–5.81)
RDW: 13.7 % (ref 11.5–15.5)
WBC: 7.7 10*3/uL (ref 4.0–10.5)

## 2017-12-20 LAB — COMPREHENSIVE METABOLIC PANEL
ALT: 29 U/L (ref 0–53)
AST: 21 U/L (ref 0–37)
Albumin: 4.4 g/dL (ref 3.5–5.2)
Alkaline Phosphatase: 55 U/L (ref 39–117)
BUN: 17 mg/dL (ref 6–23)
CO2: 28 mEq/L (ref 19–32)
CREATININE: 1.17 mg/dL (ref 0.40–1.50)
Calcium: 9.3 mg/dL (ref 8.4–10.5)
Chloride: 106 mEq/L (ref 96–112)
GFR: 80.23 mL/min (ref >60.00)
Glucose, Bld: 116 mg/dL — ABNORMAL HIGH (ref 70–99)
Potassium: 4.3 mEq/L (ref 3.5–5.1)
Sodium: 142 mEq/L (ref 135–145)
Total Bilirubin: 0.7 mg/dL (ref 0.2–1.2)
Total Protein: 7.4 g/dL (ref 6.0–8.3)

## 2017-12-20 LAB — LIPID PANEL
CHOL/HDL RATIO: 3
Cholesterol: 153 mg/dL (ref 0–200)
HDL: 52.4 mg/dL (ref >39.00)
LDL CALC: 84 mg/dL (ref 0–99)
NONHDL: 100.74
TRIGLYCERIDES: 84 mg/dL (ref 0.0–149.0)
VLDL: 16.8 mg/dL (ref 0.0–40.0)

## 2017-12-20 LAB — HEMOGLOBIN A1C: HEMOGLOBIN A1C: 6.8 % — AB (ref 4.6–6.5)

## 2017-12-20 NOTE — Progress Notes (Signed)
Angel Benson is a 67 y.o. male with the following history as recorded in EpicCare:  Patient Active Problem List   Diagnosis Date Noted  . Cardiomyopathy, ischemic 09/27/2014  . Routine general medical examination at a health care facility 02/01/2014  . S/P CABG x 5 08/26/2013  . Hyperlipidemia 08/25/2013  . Coronary artery disease involving native coronary artery of native heart without angina pectoris 08/25/2013  . Benign essential HTN 08/25/2013  . DM2 (diabetes mellitus, type 2) ([place]) 08/25/2013  . NSTEMI (non-ST elevated myocardial infarction) ([place]) 08/24/2013    Current Outpatient Medications  Medication Sig Dispense Refill  . aspirin 81 MG tablet Take 81 mg by mouth daily.    . ferrous sulfate 325 (65 FE) MG tablet Take 325 mg by mouth daily with breakfast.    . latanoprost (XALATAN) 0.005 % ophthalmic solution Place 1 drop into both eyes at bedtime.    Marland Kitchen losartan (COZAAR) 50 MG tablet TAKE 1 TABLET DAILY 90 tablet 3  . Multiple Vitamin (MULTIVITAMIN) tablet Take 1 tablet by mouth daily.    . Omega-3 Fatty Acids (FISH OIL) 1000 MG CAPS Take by mouth.    . rosuvastatin (CRESTOR) 40 MG tablet TAKE 1 TABLET DAILY 90 tablet 3  . TOPROL XL 50 MG 24 hr tablet TAKE 1 TABLET DAILY WITH OR IMMEDIATELY FOLLOWING A MEAL 90 tablet 3   No current facility-administered medications for this visit.     Allergies: Patient has no known allergies.  Past Medical History:  Diagnosis Date  . Allergy   . CAD, multiple vessel    s/p CABG  . Chicken pox   . Diabetes mellitus without complication (HCC)    Type 2  . GERD (gastroesophageal reflux disease)   . Hypercholesteremia   . Hypertension   . Urinary tract bacterial infections     Past Surgical History:  Procedure Laterality Date  . CARDIAC CATHETERIZATION    . CORONARY ARTERY BYPASS GRAFT N/A 08/26/2013   Procedure: CORONARY ARTERY BYPASS GRAFTING (CABG) times five, using left internal mammary artery, right and left greater saphenous  vein;  Surgeon: Grace Isaac, MD;  Location: [facility];  Service: Open Heart Surgery;  Laterality: N/A;  LIMA-LAD; SEQ SVG-OM1-OM2-OM3; SVG-RCA   . INTRAOPERATIVE TRANSESOPHAGEAL ECHOCARDIOGRAM N/A 08/26/2013   Procedure: INTRAOPERATIVE TRANSESOPHAGEAL ECHOCARDIOGRAM;  Surgeon: Grace Isaac, MD;  Location: [facility];  Service: Open Heart Surgery;  Laterality: N/A;    Family History  Problem Relation Age of Onset  . Diabetes Mother   . Alzheimer's disease Mother   . Heart disease Father   . Diabetes Father   . Heart disease Brother     Social History   Tobacco Use  . Smoking status: Never Smoker  . Smokeless tobacco: Never Used  Substance Use Topics  . Alcohol use: No    Subjective:  6 month follow-up on chronic care needs including: 1) HTN; 2) Hyperlipidemia; 3) Pre-diabetes; Denies any chest pain, shortness of breath, blurred vision or headache Since last OV, has been able to get his colonoscopy updated; has seen his cardiologist in follow-up for CAD; seeing ophthalmology for glaucoma follow-up; Would like to get flu shot today;  Otherwise in baseline state of health with no concerns;   Objective:  Vitals:   12/20/17 1021  BP: 120/70  Pulse: 75  Temp: 97.7 F (36.5 C)  TempSrc: Oral  SpO2: 97%  Weight: 185 lb (83.9 kg)  Height: _0  (1.753 m)    General: Well developed, well  nourished, in no acute distress  Skin : Warm and dry.  Head: Normocephalic and atraumatic  Lungs: Respirations unlabored; clear to auscultation bilaterally without wheeze, rales, rhonchi  CVS exam: normal rate and regular rhythm.  Neurologic: Alert and oriented; speech intact; face symmetrical; moves all extremities well; CNII-XII intact without focal deficit   Assessment:  1. Benign essential HTN   2. Hyperlipidemia, unspecified hyperlipidemia type   3. Pre-diabetes   4. Need for influenza vaccination     Plan:  1. Stable; continue same medication; 2. Stable; continue same medication;  check CBC, CMP, lipid panel today; 3. Reviewed home blood sugar readings- highest reading fasting am is 122; will update Hgba1c today;  Flu shot given;  Return in about 1 year (around 12/21/2018).  Orders Placed This Encounter  Procedures  . Flu vaccine HIGH DOSE PF  . CBC w/Diff    Standing Status:   Future    Number of Occurrences:   1    Standing Expiration Date:   12/20/2018  . Comp Met (CMET)    Standing Status:   Future    Number of Occurrences:   1    Standing Expiration Date:   12/20/2018  . HgB A1c    Standing Status:   Future    Number of Occurrences:   1    Standing Expiration Date:   12/20/2018  . Lipid panel    Standing Status:   Future    Number of Occurrences:   1    Standing Expiration Date:   12/21/2018    Requested Prescriptions    No prescriptions requested or ordered in this encounter

## 2018-11-21 ENCOUNTER — Other Ambulatory Visit: Payer: Self-pay | Admitting: Family

## 2019-03-06 ENCOUNTER — Other Ambulatory Visit: Payer: Self-pay | Admitting: Family

## 2019-03-06 NOTE — Telephone Encounter (Signed)
He must schedule OV for further refills; last OV was in 12/2017;

## 2019-03-15 ENCOUNTER — Other Ambulatory Visit: Payer: Self-pay

## 2019-03-15 ENCOUNTER — Ambulatory Visit: Payer: Medicare Other | Attending: Internal Medicine

## 2019-03-15 DIAGNOSIS — Z23 Encounter for immunization: Secondary | ICD-10-CM

## 2019-03-15 NOTE — Progress Notes (Signed)
   Covid-19 Vaccination Clinic  Name:  Angel Benson    MRN: [id number] DOB: [birth year]  03/15/2019  Angel Benson was observed post Covid-19 immunization for 15 minutes without incidence. He was provided with Vaccine Information Sheet and instruction to access the V-Safe system.   Angel Benson was instructed to call 911 with any severe reactions post vaccine: Marland Kitchen Difficulty breathing  . Swelling of your face and throat  . A fast heartbeat  . A bad rash all over your body  . Dizziness and weakness    Immunizations Administered    Name Date Dose VIS Date Route   Pfizer COVID-19 Vaccine 03/15/2019  2:33 PM 0.3 mL 12/26/2018 Intramuscular   Manufacturer: [place]   Lot: [id number]   [place]: [id number]

## 2019-04-14 ENCOUNTER — Ambulatory Visit: Payer: Medicare Other | Attending: Internal Medicine

## 2019-04-14 DIAGNOSIS — Z23 Encounter for immunization: Secondary | ICD-10-CM

## 2019-04-14 NOTE — Progress Notes (Signed)
   Covid-19 Vaccination Clinic  Name:  Angel Benson    MRN: [id number] DOB: 1950-07-11  04/14/2019  [name] was observed post Covid-19 immunization for 15 minutes without incident. He was provided with Vaccine Information Sheet and instruction to access the V-Safe system.   [name] was instructed to call 911 with any severe reactions post vaccine: Marland Kitchen Difficulty breathing  . Swelling of face and throat  . A fast heartbeat  . A bad rash all over body  . Dizziness and weakness   Immunizations Administered    Name Date Dose VIS Date Route   Pfizer COVID-19 Vaccine 04/14/2019  2:28 PM 0.3 mL 12/26/2018 Intramuscular   Manufacturer: [place]   Lot: [id number]   [place]: [id number]

## 2019-05-01 DIAGNOSIS — H0102A Squamous blepharitis right eye, upper and lower eyelids: Secondary | ICD-10-CM | POA: Diagnosis not present

## 2019-05-01 DIAGNOSIS — H0102B Squamous blepharitis left eye, upper and lower eyelids: Secondary | ICD-10-CM | POA: Diagnosis not present

## 2019-05-01 DIAGNOSIS — H401131 Primary open-angle glaucoma, bilateral, mild stage: Secondary | ICD-10-CM | POA: Diagnosis not present

## 2019-05-01 DIAGNOSIS — H2513 Age-related nuclear cataract, bilateral: Secondary | ICD-10-CM | POA: Diagnosis not present

## 2019-05-22 DIAGNOSIS — E119 Type 2 diabetes mellitus without complications: Secondary | ICD-10-CM | POA: Diagnosis not present

## 2019-05-22 DIAGNOSIS — H401131 Primary open-angle glaucoma, bilateral, mild stage: Secondary | ICD-10-CM | POA: Diagnosis not present

## 2019-05-22 DIAGNOSIS — H2513 Age-related nuclear cataract, bilateral: Secondary | ICD-10-CM | POA: Diagnosis not present

## 2019-05-22 DIAGNOSIS — H0102B Squamous blepharitis left eye, upper and lower eyelids: Secondary | ICD-10-CM | POA: Diagnosis not present

## 2019-05-22 DIAGNOSIS — H0102A Squamous blepharitis right eye, upper and lower eyelids: Secondary | ICD-10-CM | POA: Diagnosis not present

## 2019-06-22 ENCOUNTER — Ambulatory Visit (INDEPENDENT_AMBULATORY_CARE_PROVIDER_SITE_OTHER): Payer: Medicare Other | Admitting: Family

## 2019-06-22 ENCOUNTER — Other Ambulatory Visit: Payer: Self-pay

## 2019-06-22 ENCOUNTER — Other Ambulatory Visit: Payer: Self-pay | Admitting: Family

## 2019-06-22 ENCOUNTER — Encounter: Payer: Self-pay | Admitting: Family

## 2019-06-22 VITALS — BP 128/70 | HR 70 | Temp 98.3°F | Ht 69.0 in | Wt 181.1 lb

## 2019-06-22 DIAGNOSIS — Z125 Encounter for screening for malignant neoplasm of prostate: Secondary | ICD-10-CM

## 2019-06-22 DIAGNOSIS — R972 Elevated prostate specific antigen [PSA]: Secondary | ICD-10-CM

## 2019-06-22 DIAGNOSIS — I10 Essential (primary) hypertension: Secondary | ICD-10-CM | POA: Diagnosis not present

## 2019-06-22 DIAGNOSIS — R7309 Other abnormal glucose: Secondary | ICD-10-CM | POA: Diagnosis not present

## 2019-06-22 DIAGNOSIS — E785 Hyperlipidemia, unspecified: Secondary | ICD-10-CM

## 2019-06-22 DIAGNOSIS — Z0000 Encounter for general adult medical examination without abnormal findings: Secondary | ICD-10-CM

## 2019-06-22 DIAGNOSIS — R350 Frequency of micturition: Secondary | ICD-10-CM | POA: Diagnosis not present

## 2019-06-22 LAB — CBC WITH DIFFERENTIAL/PLATELET
Basophils Absolute: 0 10*3/uL (ref 0.0–0.1)
Basophils Relative: 0.6 % (ref 0.0–3.0)
Eosinophils Absolute: 0.2 10*3/uL (ref 0.0–0.7)
Eosinophils Relative: 2.5 % (ref 0.0–5.0)
HCT: 40.6 % (ref 39.0–52.0)
Hemoglobin: 13.4 g/dL (ref 13.0–17.0)
Lymphocytes Relative: 21.7 % (ref 12.0–46.0)
Lymphs Abs: 1.7 10*3/uL (ref 0.7–4.0)
MCHC: 32.9 g/dL (ref 30.0–36.0)
MCV: 86.1 fl (ref 78.0–100.0)
Monocytes Absolute: 0.7 10*3/uL (ref 0.1–1.0)
Monocytes Relative: 9.1 % (ref 3.0–12.0)
Neutro Abs: 5.2 10*3/uL (ref 1.4–7.7)
Neutrophils Relative %: 66.1 % (ref 43.0–77.0)
Platelets: 179 10*3/uL (ref 150.0–400.0)
RBC: 4.71 Mil/uL (ref 4.22–5.81)
RDW: 13.5 % (ref 11.5–15.5)
WBC: 7.8 10*3/uL (ref 4.0–10.5)

## 2019-06-22 LAB — COMPREHENSIVE METABOLIC PANEL
ALT: 22 U/L (ref 0–53)
AST: 17 U/L (ref 0–37)
Albumin: 3.7 g/dL (ref 3.5–5.2)
Alkaline Phosphatase: 64 U/L (ref 39–117)
BUN: 19 mg/dL (ref 6–23)
CO2: 28 mEq/L (ref 19–32)
Calcium: 8.9 mg/dL (ref 8.4–10.5)
Chloride: 103 mEq/L (ref 96–112)
Creatinine, Ser: 1.15 mg/dL (ref 0.40–1.50)
GFR: 76.65 mL/min (ref >60.00)
Glucose, Bld: 210 mg/dL — ABNORMAL HIGH (ref 70–99)
Potassium: 4.8 mEq/L (ref 3.5–5.1)
Sodium: 139 mEq/L (ref 135–145)
Total Bilirubin: 0.5 mg/dL (ref 0.2–1.2)
Total Protein: 6.6 g/dL (ref 6.0–8.3)

## 2019-06-22 LAB — URINALYSIS, ROUTINE W REFLEX MICROSCOPIC
Bilirubin Urine: NEGATIVE
Ketones, ur: 15 — AB
Leukocytes,Ua: NEGATIVE
Nitrite: NEGATIVE
Specific Gravity, Urine: 1.025 (ref 1.000–1.030)
Total Protein, Urine: NEGATIVE
Urine Glucose: 100 — AB
Urobilinogen, UA: 0.2 (ref 0.0–1.0)
pH: 5.5 (ref 5.0–8.0)

## 2019-06-22 LAB — LIPID PANEL
Cholesterol: 211 mg/dL — ABNORMAL HIGH (ref 0–200)
HDL: 30 mg/dL — ABNORMAL LOW (ref >39.00)
LDL Cholesterol: 161 mg/dL — ABNORMAL HIGH (ref 0–99)
NonHDL: 181.21
Total CHOL/HDL Ratio: 7
Triglycerides: 100 mg/dL (ref 0.0–149.0)
VLDL: 20 mg/dL (ref 0.0–40.0)

## 2019-06-22 LAB — PSA, MEDICARE: PSA: 30.72 ng/ml — ABNORMAL HIGH (ref 0.10–4.00)

## 2019-06-22 LAB — HEMOGLOBIN A1C: Hgb A1c MFr Bld: 8.1 % — ABNORMAL HIGH (ref 4.6–6.5)

## 2019-06-22 MED ORDER — METOPROLOL SUCCINATE ER 50 MG PO TB24: ORAL_TABLET | ORAL | 3 refills | Status: DC

## 2019-06-22 MED ORDER — METFORMIN HCL ER 500 MG PO TB24: 500.0000 mg | ORAL_TABLET | Freq: Every day | ORAL | 1 refills | Status: DC

## 2019-06-22 MED ORDER — LOSARTAN POTASSIUM 50 MG PO TABS: 50.0000 mg | ORAL_TABLET | Freq: Every day | ORAL | 3 refills | Status: DC

## 2019-06-22 MED ORDER — ROSUVASTATIN CALCIUM 40 MG PO TABS: 40.0000 mg | ORAL_TABLET | Freq: Every day | ORAL | 3 refills | Status: DC

## 2019-06-22 MED ORDER — CIPROFLOXACIN HCL 500 MG PO TABS
500.0000 mg | ORAL_TABLET | Freq: Two times a day (BID) | ORAL | 0 refills | Status: DC
Start: 2019-06-22 — End: 2019-08-25

## 2019-06-22 NOTE — Progress Notes (Signed)
Angel Benson is a 69 y.o. male with the following history as recorded in EpicCare:  Patient Active Problem List   Diagnosis Date Noted  . Cardiomyopathy, ischemic 09/27/2014  . Routine general medical examination at a health care facility 02/01/2014  . S/P CABG x 5 08/26/2013  . Hyperlipidemia 08/25/2013  . Coronary artery disease involving native coronary artery of native heart without angina pectoris 08/25/2013  . Benign essential HTN 08/25/2013  . DM2 (diabetes mellitus, type 2) ([place]) 08/25/2013  . NSTEMI (non-ST elevated myocardial infarction) ([place]) 08/24/2013    Current Outpatient Medications  Medication Sig Dispense Refill  . aspirin 81 MG tablet Take 81 mg by mouth daily.    . ferrous sulfate 325 (65 FE) MG tablet Take 325 mg by mouth daily with breakfast.    . latanoprost (XALATAN) 0.005 % ophthalmic solution Place 1 drop into both eyes at bedtime.    Marland Kitchen losartan (COZAAR) 50 MG tablet Take 1 tablet (50 mg total) by mouth daily. 90 tablet 3  . metoprolol succinate (TOPROL-XL) 50 MG 24 hr tablet TAKE 1 TABLET DAILY WITH OR IMMEDIATELY FOLLOWING A MEAL 90 tablet 3  . Multiple Vitamin (MULTIVITAMIN) tablet Take 1 tablet by mouth daily.    . Omega-3 Fatty Acids (FISH OIL) 1000 MG CAPS Take by mouth.    . rosuvastatin (CRESTOR) 40 MG tablet Take 1 tablet (40 mg total) by mouth daily. 90 tablet 3  . SIMBRINZA 1-0.2 % SUSP 1 drop Both Eyes 2 times a day    . timolol (TIMOPTIC) 0.5 % ophthalmic solution Use 1 drop in both eyes once a day     No current facility-administered medications for this visit.    Allergies: Patient has no known allergies.  Past Medical History:  Diagnosis Date  . Allergy   . CAD, multiple vessel    s/p CABG  . Chicken pox   . Diabetes mellitus without complication (HCC)    Type 2  . GERD (gastroesophageal reflux disease)   . Hypercholesteremia   . Hypertension   . Urinary tract bacterial infections     Past Surgical History:  Procedure Laterality  Date  . CARDIAC CATHETERIZATION    . CORONARY ARTERY BYPASS GRAFT N/A 08/26/2013   Procedure: CORONARY ARTERY BYPASS GRAFTING (CABG) times five, using left internal mammary artery, right and left greater saphenous vein;  Surgeon: Grace Isaac, MD;  Location: [facility];  Service: Open Heart Surgery;  Laterality: N/A;  LIMA-LAD; SEQ SVG-OM1-OM2-OM3; SVG-RCA   . INTRAOPERATIVE TRANSESOPHAGEAL ECHOCARDIOGRAM N/A 08/26/2013   Procedure: INTRAOPERATIVE TRANSESOPHAGEAL ECHOCARDIOGRAM;  Surgeon: Grace Isaac, MD;  Location: [facility];  Service: Open Heart Surgery;  Laterality: N/A;    Family History  Problem Relation Age of Onset  . Diabetes Mother   . Alzheimer's disease Mother   . Heart disease Father   . Diabetes Father   . Heart disease Brother     Social History   Tobacco Use  . Smoking status: Never Smoker  . Smokeless tobacco: Never Used  Substance Use Topics  . Alcohol use: No    Subjective:   Presents for yearly CPE; has not been seen here since 12/2017; notes that he "just didn't feel good last week"- was having increased urinary symptoms/ decreased appetite/ noticed that his blood sugar was elevated; "just didn't feel like I had any energy." Under care of cardiology and ophthalmology; overdue to see his dentist.  Review of Systems  Constitutional: Negative.   HENT: Negative.  Eyes: Negative.   Respiratory: Negative.   Cardiovascular: Negative.   Gastrointestinal: Negative.   Genitourinary: Negative.   Musculoskeletal: Negative.   Skin: Negative.   Neurological: Negative.   Endo/Heme/Allergies: Negative.   Psychiatric/Behavioral: Negative.      Objective:  Vitals:   06/22/19 1029  BP: 128/70  Pulse: 70  Temp: 98.3 F (36.8 C)  TempSrc: Oral  SpO2: 98%  Weight: 181 lb 1.6 oz (82.1 kg)  Height: 5' 9"  (1.753 m)    General: Well developed, well nourished, in no acute distress  Skin : Warm and dry.  Head: Normocephalic and atraumatic  Eyes: Sclera and  conjunctiva clear; pupils round and reactive to light; extraocular movements intact  Ears: External normal; canals clear; tympanic membranes normal  Oropharynx: Pink, supple. No suspicious lesions  Neck: Supple without thyromegaly, adenopathy  Lungs: Respirations unlabored; clear to auscultation bilaterally without wheeze, rales, rhonchi  CVS exam: normal rate and regular rhythm.  Abdomen: Soft; nontender; nondistended; normoactive bowel sounds; no masses or hepatosplenomegaly  Musculoskeletal: No deformities; no active joint inflammation  Extremities: No edema, cyanosis, clubbing  Vessels: Symmetric bilaterally  Neurologic: Alert and oriented; speech intact; face symmetrical; moves all extremities well; CNII-XII intact without focal deficit   Assessment:  1. PE (physical exam), annual   2. Benign essential HTN   3. Hyperlipidemia, unspecified hyperlipidemia type   4. Prostate cancer screening   5. Elevated glucose   6. Urinary frequency     Plan:  Age appropriate preventive healthcare needs addressed; encouraged regular eye doctor and dental exams; encouraged regular exercise; will update labs and refills as needed today; follow-up to be determined; He needs to see his cardiologist as well- follow-up there was recommended; May to need to consider medication for diabetes control.  This visit occurred during the SARS-CoV-2 public health emergency.  Safety protocols were in place, including screening questions prior to the visit, additional usage of staff PPE, and extensive cleaning of exam room while observing appropriate contact time as indicated for disinfecting solutions.      No follow-ups on file.  Orders Placed This Encounter  Procedures  . CBC with Differential/Platelet  . Comp Met (CMET)  . Lipid panel  . PSA, Medicare  . HgB A1c  . Urinalysis    Requested Prescriptions   Signed Prescriptions Disp Refills  . losartan (COZAAR) 50 MG tablet 90 tablet 3    Sig: Take 1  tablet (50 mg total) by mouth daily.  . metoprolol succinate (TOPROL-XL) 50 MG 24 hr tablet 90 tablet 3    Sig: TAKE 1 TABLET DAILY WITH OR IMMEDIATELY FOLLOWING A MEAL  . rosuvastatin (CRESTOR) 40 MG tablet 90 tablet 3    Sig: Take 1 tablet (40 mg total) by mouth daily.

## 2019-06-22 NOTE — Progress Notes (Signed)
Reviewed with patient; 1) He has been off Crestor for 1-2 weeks; will get back on as prescribed; 2) Start Cipro for possible prostate infection; refer to urology; 3) Start Metformin for diabetes; follow-up in 2 months;

## 2019-08-21 DIAGNOSIS — H2513 Age-related nuclear cataract, bilateral: Secondary | ICD-10-CM | POA: Diagnosis not present

## 2019-08-21 DIAGNOSIS — H401131 Primary open-angle glaucoma, bilateral, mild stage: Secondary | ICD-10-CM | POA: Diagnosis not present

## 2019-08-21 DIAGNOSIS — H0102A Squamous blepharitis right eye, upper and lower eyelids: Secondary | ICD-10-CM | POA: Diagnosis not present

## 2019-08-21 DIAGNOSIS — H0102B Squamous blepharitis left eye, upper and lower eyelids: Secondary | ICD-10-CM | POA: Diagnosis not present

## 2019-08-21 DIAGNOSIS — E119 Type 2 diabetes mellitus without complications: Secondary | ICD-10-CM | POA: Diagnosis not present

## 2019-08-25 ENCOUNTER — Other Ambulatory Visit: Payer: Self-pay

## 2019-08-25 ENCOUNTER — Ambulatory Visit (INDEPENDENT_AMBULATORY_CARE_PROVIDER_SITE_OTHER): Payer: Medicare Other | Admitting: Family

## 2019-08-25 VITALS — BP 120/70 | HR 66 | Temp 98.2°F | Ht 69.0 in | Wt 179.0 lb

## 2019-08-25 DIAGNOSIS — D649 Anemia, unspecified: Secondary | ICD-10-CM | POA: Diagnosis not present

## 2019-08-25 DIAGNOSIS — E119 Type 2 diabetes mellitus without complications: Secondary | ICD-10-CM

## 2019-08-25 DIAGNOSIS — R972 Elevated prostate specific antigen [PSA]: Secondary | ICD-10-CM | POA: Diagnosis not present

## 2019-08-25 DIAGNOSIS — E785 Hyperlipidemia, unspecified: Secondary | ICD-10-CM

## 2019-08-25 NOTE — Progress Notes (Signed)
Angel Benson is a 69 y.o. male with the following history as recorded in EpicCare:  Patient Active Problem List   Diagnosis Date Noted   Cardiomyopathy, ischemic 09/27/2014   Routine general medical examination at a health care facility 02/01/2014   S/P CABG x 5 08/26/2013   Hyperlipidemia 08/25/2013   Coronary artery disease involving native coronary artery of native heart without angina pectoris 08/25/2013   Benign essential HTN 08/25/2013   Type 2 diabetes mellitus with complication () 54/09/8117   NSTEMI (non-ST elevated myocardial infarction) ([place]) 08/24/2013    Current Outpatient Medications  Medication Sig Dispense Refill   aspirin 81 MG tablet Take 81 mg by mouth daily.     ferrous sulfate 325 (65 FE) MG tablet Take 325 mg by mouth daily with breakfast.     losartan (COZAAR) 50 MG tablet Take 1 tablet (50 mg total) by mouth daily. 90 tablet 3   metFORMIN (GLUCOPHAGE-XR) 500 MG 24 hr tablet Take 1 tablet (500 mg total) by mouth at bedtime. 30 tablet 1   metoprolol succinate (TOPROL-XL) 50 MG 24 hr tablet TAKE 1 TABLET DAILY WITH OR IMMEDIATELY FOLLOWING A MEAL 90 tablet 3   Multiple Vitamin (MULTIVITAMIN) tablet Take 1 tablet by mouth daily.     Netarsudil-Latanoprost (ROCKLATAN) 0.02-0.005 % SOLN Apply to eye. Once daily     Omega-3 Fatty Acids (FISH OIL) 1000 MG CAPS Take by mouth.     rosuvastatin (CRESTOR) 40 MG tablet Take 1 tablet (40 mg total) by mouth daily. 90 tablet 3   SIMBRINZA 1-0.2 % SUSP 1 drop Both Eyes 2 times a day     timolol (TIMOPTIC) 0.5 % ophthalmic solution Use 1 drop in both eyes once a day     No current facility-administered medications for this visit.    Allergies: Patient has no known allergies.  Past Medical History:  Diagnosis Date   Allergy    CAD, multiple vessel    s/p CABG   Chicken pox    Diabetes mellitus without complication (HCC)    Type 2   GERD (gastroesophageal reflux disease)    Hypercholesteremia     Hypertension    Urinary tract bacterial infections     Past Surgical History:  Procedure Laterality Date   CARDIAC CATHETERIZATION     CORONARY ARTERY BYPASS GRAFT N/A 08/26/2013   Procedure: CORONARY ARTERY BYPASS GRAFTING (CABG) times five, using left internal mammary artery, right and left greater saphenous vein;  Surgeon: Grace Isaac, MD;  Location: [facility];  Service: Open Heart Surgery;  Laterality: N/A;  LIMA-LAD; SEQ SVG-OM1-OM2-OM3; SVG-RCA    INTRAOPERATIVE TRANSESOPHAGEAL ECHOCARDIOGRAM N/A 08/26/2013   Procedure: INTRAOPERATIVE TRANSESOPHAGEAL ECHOCARDIOGRAM;  Surgeon: Grace Isaac, MD;  Location: [facility];  Service: Open Heart Surgery;  Laterality: N/A;    Family History  Problem Relation Age of Onset   Diabetes Mother    Alzheimer's disease Mother    Heart disease Father    Diabetes Father    Heart disease Brother     Social History   Tobacco Use   Smoking status: Never Smoker   Smokeless tobacco: Never Used  Substance Use Topics   Alcohol use: No    Subjective:  Follow up on chronic care needs:  1) Type 2 Diabetes- started on Metformin at last OV; tolerating well; no concerns for low blood sugar; 2) Hyperlipidemia- has re-started his Crestor; 3) Elevated PSA- completed Cipro; has not been to urology yet; agreeable to going if referral needs  to be re-done;  Objective:  Vitals:   08/25/19 1103  BP: 120/70  Pulse: 66  Temp: 98.2 F (36.8 C)  TempSrc: Oral  SpO2: 99%  Weight: 179 lb (81.2 kg)  Height: 5' 9"  (1.753 m)    General: Well developed, well nourished, in no acute distress  Skin : Warm and dry.  Head: Normocephalic and atraumatic  Lungs: Respirations unlabored; clear to auscultation bilaterally without wheeze, rales, rhonchi  CVS exam: normal rate and regular rhythm.  Neurologic: Alert and oriented; speech intact; face symmetrical; moves all extremities well; CNII-XII intact without focal deficit   Assessment:  1. Type 2  diabetes mellitus without complication, without long-term current use of insulin (HCC)   2. Elevated PSA   3. Hyperlipidemia, unspecified hyperlipidemia type   4. Anemia, unspecified type     Plan:  1. Update Hgba1c today; continue Metformin daily as prescribed for now; 2. Re-check PSA; will need to refer him back to urology; 3. Stay on Crestor 40 mg daily; check lipid panel today; 4. Check ferritin; not sure why patient was started on iron supplement- last CBC was normal; okay to hold for now;   This visit occurred during the SARS-CoV-2 public health emergency.  Safety protocols were in place, including screening questions prior to the visit, additional usage of staff PPE, and extensive cleaning of exam room while observing appropriate contact time as indicated for disinfecting solutions.     No follow-ups on file.  Orders Placed This Encounter  Procedures   PSA    Standing Status:   Future    Number of Occurrences:   1    Standing Expiration Date:   08/24/2020   Comp Met (CMET)    Standing Status:   Future    Number of Occurrences:   1    Standing Expiration Date:   08/24/2020   Hemoglobin A1c    Standing Status:   Future    Number of Occurrences:   1    Standing Expiration Date:   08/24/2020   Lipid panel    Standing Status:   Future    Number of Occurrences:   1    Standing Expiration Date:   08/24/2020   Ferritin    Standing Status:   Future    Number of Occurrences:   1    Standing Expiration Date:   08/24/2020    Requested Prescriptions    No prescriptions requested or ordered in this encounter

## 2019-08-25 NOTE — Patient Instructions (Signed)
It is fine to hold the iron supplement for now; we will make a decision about urology and continuing the Metformin after I see the labs;

## 2019-08-26 ENCOUNTER — Other Ambulatory Visit: Payer: Self-pay | Admitting: Family

## 2019-08-26 DIAGNOSIS — R972 Elevated prostate specific antigen [PSA]: Secondary | ICD-10-CM

## 2019-08-26 LAB — LIPID PANEL
Cholesterol: 128 mg/dL (ref <200)
HDL: 43 mg/dL (ref >=40)
LDL Cholesterol (Calc): 69 mg/dL (calc)
Non-HDL Cholesterol (Calc): 85 mg/dL (calc) (ref <130)
Total CHOL/HDL Ratio: 3 (calc) (ref <5.0)
Triglycerides: 80 mg/dL (ref <150)

## 2019-08-26 LAB — HEMOGLOBIN A1C
Hgb A1c MFr Bld: 7.4 % of total Hgb — ABNORMAL HIGH (ref <5.7)
Mean Plasma Glucose: 166 (calc)
eAG (mmol/L): 9.2 (calc)

## 2019-08-26 LAB — COMPREHENSIVE METABOLIC PANEL
AG Ratio: 1.6 (calc) (ref 1.0–2.5)
ALT: 15 U/L (ref 9–46)
AST: 15 U/L (ref 10–35)
Albumin: 4.2 g/dL (ref 3.6–5.1)
Alkaline phosphatase (APISO): 57 U/L (ref 35–144)
BUN/Creatinine Ratio: 14 (calc) (ref 6–22)
BUN: 18 mg/dL (ref 7–25)
CO2: 28 mmol/L (ref 20–32)
Calcium: 9.2 mg/dL (ref 8.6–10.3)
Chloride: 106 mmol/L (ref 98–110)
Creat: 1.29 mg/dL — ABNORMAL HIGH (ref 0.70–1.25)
Globulin: 2.7 g/dL (calc) (ref 1.9–3.7)
Glucose, Bld: 116 mg/dL — ABNORMAL HIGH (ref 65–99)
Potassium: 4.9 mmol/L (ref 3.5–5.3)
Sodium: 141 mmol/L (ref 135–146)
Total Bilirubin: 0.6 mg/dL (ref 0.2–1.2)
Total Protein: 6.9 g/dL (ref 6.1–8.1)

## 2019-08-26 LAB — PSA: PSA: 4.8 ng/mL — ABNORMAL HIGH (ref <=4.0)

## 2019-08-26 LAB — FERRITIN: Ferritin: 193 ng/mL (ref 24–380)

## 2019-08-26 MED ORDER — METFORMIN HCL ER 500 MG PO TB24: 500.0000 mg | ORAL_TABLET | Freq: Every day | ORAL | 1 refills | Status: DC

## 2019-09-09 DIAGNOSIS — H0102A Squamous blepharitis right eye, upper and lower eyelids: Secondary | ICD-10-CM | POA: Diagnosis not present

## 2019-09-09 DIAGNOSIS — H401131 Primary open-angle glaucoma, bilateral, mild stage: Secondary | ICD-10-CM | POA: Diagnosis not present

## 2019-09-09 DIAGNOSIS — E119 Type 2 diabetes mellitus without complications: Secondary | ICD-10-CM | POA: Diagnosis not present

## 2019-09-09 DIAGNOSIS — H2513 Age-related nuclear cataract, bilateral: Secondary | ICD-10-CM | POA: Diagnosis not present

## 2019-09-09 DIAGNOSIS — H0102B Squamous blepharitis left eye, upper and lower eyelids: Secondary | ICD-10-CM | POA: Diagnosis not present

## 2019-10-08 DIAGNOSIS — H25012 Cortical age-related cataract, left eye: Secondary | ICD-10-CM | POA: Diagnosis not present

## 2019-10-08 DIAGNOSIS — H401121 Primary open-angle glaucoma, left eye, mild stage: Secondary | ICD-10-CM | POA: Diagnosis not present

## 2019-11-15 DIAGNOSIS — H2511 Age-related nuclear cataract, right eye: Secondary | ICD-10-CM | POA: Diagnosis not present

## 2019-11-19 DIAGNOSIS — H401111 Primary open-angle glaucoma, right eye, mild stage: Secondary | ICD-10-CM | POA: Diagnosis not present

## 2019-11-19 DIAGNOSIS — H2511 Age-related nuclear cataract, right eye: Secondary | ICD-10-CM | POA: Diagnosis not present

## 2020-01-16 DIAGNOSIS — C61 Malignant neoplasm of prostate: Secondary | ICD-10-CM

## 2020-01-16 HISTORY — DX: Malignant neoplasm of prostate: C61

## 2020-02-04 ENCOUNTER — Other Ambulatory Visit: Payer: Self-pay | Admitting: Family

## 2020-02-04 NOTE — Telephone Encounter (Signed)
I have refilled his Metformin for him and he will need an OV in late February/ early March. I also want to verify that he did see urology as we discussed in August. The referral was done but I can't see the follow-up.

## 2020-02-09 NOTE — Telephone Encounter (Signed)
Pt notified of urology appt.

## 2020-02-09 NOTE — Telephone Encounter (Signed)
Pt notified that prescription refilled & appt made for 03/14/20. Pt states urologist called him "but I don't know what happened".  Pt confirmed that he still wants to see urologist.   [facility] called & appt made for pt for 03/09/20 at 1pm; office confirmed they will send pt a new patient packet through the mail.  LVM informing pt to call back for information re: urology appt.

## 2020-03-14 ENCOUNTER — Other Ambulatory Visit: Payer: Self-pay

## 2020-03-14 ENCOUNTER — Ambulatory Visit (INDEPENDENT_AMBULATORY_CARE_PROVIDER_SITE_OTHER): Payer: Medicare Other | Admitting: Family

## 2020-03-14 VITALS — BP 130/76 | HR 86 | Temp 97.8°F | Ht 69.0 in | Wt 182.8 lb

## 2020-03-14 DIAGNOSIS — I10 Essential (primary) hypertension: Secondary | ICD-10-CM

## 2020-03-14 DIAGNOSIS — E119 Type 2 diabetes mellitus without complications: Secondary | ICD-10-CM

## 2020-03-14 DIAGNOSIS — R972 Elevated prostate specific antigen [PSA]: Secondary | ICD-10-CM

## 2020-03-14 DIAGNOSIS — Z23 Encounter for immunization: Secondary | ICD-10-CM

## 2020-03-14 LAB — POCT GLYCOSYLATED HEMOGLOBIN (HGB A1C): Hemoglobin A1C: 7.7 % — AB (ref 4.0–5.6)

## 2020-03-14 NOTE — Progress Notes (Signed)
Angel Benson is a 70 y.o. male with the following history as recorded in EpicCare:  Patient Active Problem List   Diagnosis Date Noted   Cardiomyopathy, ischemic 09/27/2014   Routine general medical examination at a health care facility 02/01/2014   S/P CABG x 5 08/26/2013   Hyperlipidemia 08/25/2013   Coronary artery disease involving native coronary artery of native heart without angina pectoris 08/25/2013   Benign essential HTN 08/25/2013   Type 2 diabetes mellitus with complication ([place]) 62/95/2841   NSTEMI (non-ST elevated myocardial infarction) ([place]) 08/24/2013    Current Outpatient Medications  Medication Sig Dispense Refill   aspirin 81 MG tablet Take 81 mg by mouth daily.     ferrous sulfate 325 (65 FE) MG tablet Take 325 mg by mouth daily with breakfast.     losartan (COZAAR) 50 MG tablet Take 1 tablet (50 mg total) by mouth daily. 90 tablet 3   metFORMIN (GLUCOPHAGE-XR) 500 MG 24 hr tablet TAKE 1 TABLET AT BEDTIME 90 tablet 1   metoprolol succinate (TOPROL-XL) 50 MG 24 hr tablet TAKE 1 TABLET DAILY WITH OR IMMEDIATELY FOLLOWING A MEAL 90 tablet 3   Multiple Vitamin (MULTIVITAMIN) tablet Take 1 tablet by mouth daily.     Netarsudil-Latanoprost (ROCKLATAN) 0.02-0.005 % SOLN Apply to eye. Once daily     Omega-3 Fatty Acids (FISH OIL) 1000 MG CAPS Take by mouth.     rosuvastatin (CRESTOR) 40 MG tablet Take 1 tablet (40 mg total) by mouth daily. 90 tablet 3   SIMBRINZA 1-0.2 % SUSP 1 drop Both Eyes 2 times a day     timolol (TIMOPTIC) 0.5 % ophthalmic solution Use 1 drop in both eyes once a day     No current facility-administered medications for this visit.    Allergies: Patient has no known allergies.  Past Medical History:  Diagnosis Date   Allergy    CAD, multiple vessel    s/p CABG   Chicken pox    Diabetes mellitus without complication (HCC)    Type 2   GERD (gastroesophageal reflux disease)    Hypercholesteremia    Hypertension     Urinary tract bacterial infections     Past Surgical History:  Procedure Laterality Date   CARDIAC CATHETERIZATION     CORONARY ARTERY BYPASS GRAFT N/A 08/26/2013   Procedure: CORONARY ARTERY BYPASS GRAFTING (CABG) times five, using left internal mammary artery, right and left greater saphenous vein;  Surgeon: Grace Isaac, MD;  Location: [facility];  Service: Open Heart Surgery;  Laterality: N/A;  LIMA-LAD; SEQ SVG-OM1-OM2-OM3; SVG-RCA    INTRAOPERATIVE TRANSESOPHAGEAL ECHOCARDIOGRAM N/A 08/26/2013   Procedure: INTRAOPERATIVE TRANSESOPHAGEAL ECHOCARDIOGRAM;  Surgeon: Grace Isaac, MD;  Location: [place];  Service: Open Heart Surgery;  Laterality: N/A;    Family History  Problem Relation Age of Onset   Diabetes Mother    Alzheimer's disease Mother    Heart disease Father    Diabetes Father    Heart disease Brother     Social History   Tobacco Use   Smoking status: Never Smoker   Smokeless tobacco: Never Used  Substance Use Topics   Alcohol use: No    Subjective:   Follow-up on Type 2 Diabetes;  Last Hgba1c was at 7.4- today at 7.7; Taking Metformin at night- fasting blood sugars are averaging in the high 140s/ 150s;  Denies any chest pain, shortness of breath, blurred vision or headache  Has had glaucoma and cataract surgery since last seen here; doing  well- sees Dr. Katy Fitch in the next few weeks for follow-up;    Objective:  Vitals:   03/14/20 1003  BP: 130/76  Pulse: 86  Temp: 97.8 F (36.6 C)  TempSrc: Oral  SpO2: 99%  Weight: 182 lb 12.8 oz (82.9 kg)  Height: 5\' 9"  (1.753 m)    General: Well developed, well nourished, in no acute distress  Skin : Warm and dry.  Head: Normocephalic and atraumatic  Lungs: Respirations unlabored; clear to auscultation bilaterally without wheeze, rales, rhonchi  CVS exam: normal rate and regular rhythm.  Neurologic: Alert and oriented; speech intact; face symmetrical; moves all extremities well; CNII-XII intact without  focal deficit   Assessment:  1. Type 2 diabetes mellitus without complication, without long-term current use of insulin ([place])   2. Benign essential HTN   3. Elevated PSA   4. Need for vaccination against Streptococcus pneumoniae     Plan:  Increase Metformin to 500 mg bid; he will continue to check his blood sugar regularly; Stable; he understands we may have to change his Losartan if national shortage starts to affect 50 mg dosage; Keep planned follow up with urology; Pneumovax #2 given today;   This visit occurred during the SARS-CoV-2 public health emergency.  Safety protocols were in place, including screening questions prior to the visit, additional usage of staff PPE, and extensive cleaning of exam room while observing appropriate contact time as indicated for disinfecting solutions.     Return in about 1 month (around 04/11/2020).  Orders Placed This Encounter  Procedures   Pneumococcal polysaccharide vaccine 23-valent greater than or equal to 2yo subcutaneous/IM   POCT HgB A1C    Requested Prescriptions    No prescriptions requested or ordered in this encounter

## 2020-03-29 DIAGNOSIS — H401131 Primary open-angle glaucoma, bilateral, mild stage: Secondary | ICD-10-CM | POA: Diagnosis not present

## 2020-03-29 DIAGNOSIS — H0102B Squamous blepharitis left eye, upper and lower eyelids: Secondary | ICD-10-CM | POA: Diagnosis not present

## 2020-03-29 DIAGNOSIS — Z961 Presence of intraocular lens: Secondary | ICD-10-CM | POA: Diagnosis not present

## 2020-03-29 DIAGNOSIS — H43812 Vitreous degeneration, left eye: Secondary | ICD-10-CM | POA: Diagnosis not present

## 2020-03-29 DIAGNOSIS — H0102A Squamous blepharitis right eye, upper and lower eyelids: Secondary | ICD-10-CM | POA: Diagnosis not present

## 2020-03-29 DIAGNOSIS — H04123 Dry eye syndrome of bilateral lacrimal glands: Secondary | ICD-10-CM | POA: Diagnosis not present

## 2020-03-29 DIAGNOSIS — E119 Type 2 diabetes mellitus without complications: Secondary | ICD-10-CM | POA: Diagnosis not present

## 2020-04-12 ENCOUNTER — Encounter: Payer: Self-pay | Admitting: Family

## 2020-04-12 ENCOUNTER — Ambulatory Visit (INDEPENDENT_AMBULATORY_CARE_PROVIDER_SITE_OTHER): Payer: Medicare Other | Admitting: Family

## 2020-04-12 ENCOUNTER — Other Ambulatory Visit: Payer: Self-pay

## 2020-04-12 VITALS — BP 130/70 | HR 65 | Temp 97.9°F | Ht 69.0 in | Wt 177.4 lb

## 2020-04-12 DIAGNOSIS — I10 Essential (primary) hypertension: Secondary | ICD-10-CM

## 2020-04-12 DIAGNOSIS — I2510 Atherosclerotic heart disease of native coronary artery without angina pectoris: Secondary | ICD-10-CM | POA: Diagnosis not present

## 2020-04-12 DIAGNOSIS — E119 Type 2 diabetes mellitus without complications: Secondary | ICD-10-CM | POA: Diagnosis not present

## 2020-04-12 DIAGNOSIS — R972 Elevated prostate specific antigen [PSA]: Secondary | ICD-10-CM | POA: Diagnosis not present

## 2020-04-12 MED ORDER — METFORMIN HCL ER 500 MG PO TB24: 1000.0000 mg | ORAL_TABLET | Freq: Two times a day (BID) | ORAL | 1 refills | Status: DC

## 2020-04-12 NOTE — Progress Notes (Signed)
Angel Benson is a 70 y.o. male with the following history as recorded in EpicCare:  Patient Active Problem List   Diagnosis Date Noted  . Cardiomyopathy, ischemic 09/27/2014  . Routine general medical examination at a health care facility 02/01/2014  . S/P CABG x 5 08/26/2013  . Hyperlipidemia 08/25/2013  . Coronary artery disease involving native coronary artery of native heart without angina pectoris 08/25/2013  . Benign essential HTN 08/25/2013  . Type 2 diabetes mellitus with complication ([place]) 82/50/5397  . NSTEMI (non-ST elevated myocardial infarction) ([place]) 08/24/2013    Current Outpatient Medications  Medication Sig Dispense Refill  . aspirin 81 MG tablet Take 81 mg by mouth daily.    Marland Kitchen losartan (COZAAR) 50 MG tablet Take 1 tablet (50 mg total) by mouth daily. 90 tablet 3  . metFORMIN (GLUCOPHAGE XR) 500 MG 24 hr tablet Take 2 tablets (1,000 mg total) by mouth in the morning and at bedtime. 360 tablet 1  . metoprolol succinate (TOPROL-XL) 50 MG 24 hr tablet TAKE 1 TABLET DAILY WITH OR IMMEDIATELY FOLLOWING A MEAL 90 tablet 3  . Netarsudil-Latanoprost (ROCKLATAN) 0.02-0.005 % SOLN Apply to eye. Once daily    . rosuvastatin (CRESTOR) 40 MG tablet Take 1 tablet (40 mg total) by mouth daily. 90 tablet 3  . SIMBRINZA 1-0.2 % SUSP 1 drop Both Eyes 2 times a day    . timolol (TIMOPTIC) 0.5 % ophthalmic solution Use 1 drop in both eyes once a day    . Multiple Vitamin (MULTIVITAMIN) tablet Take 1 tablet by mouth daily. (Patient not taking: Reported on 04/12/2020)    . Omega-3 Fatty Acids (FISH OIL) 1000 MG CAPS Take by mouth. (Patient not taking: Reported on 04/12/2020)     No current facility-administered medications for this visit.    Allergies: Patient has no known allergies.  Past Medical History:  Diagnosis Date  . Allergy   . CAD, multiple vessel    s/p CABG  . Chicken pox   . Diabetes mellitus without complication (HCC)    Type 2  . GERD (gastroesophageal reflux disease)    . Hypercholesteremia   . Hypertension   . Urinary tract bacterial infections     Past Surgical History:  Procedure Laterality Date  . CARDIAC CATHETERIZATION    . CORONARY ARTERY BYPASS GRAFT N/A 08/26/2013   Procedure: CORONARY ARTERY BYPASS GRAFTING (CABG) times five, using left internal mammary artery, right and left greater saphenous vein;  Surgeon: Grace Isaac, MD;  Location: [place];  Service: Open Heart Surgery;  Laterality: N/A;  LIMA-LAD; SEQ SVG-OM1-OM2-OM3; SVG-RCA   . INTRAOPERATIVE TRANSESOPHAGEAL ECHOCARDIOGRAM N/A 08/26/2013   Procedure: INTRAOPERATIVE TRANSESOPHAGEAL ECHOCARDIOGRAM;  Surgeon: Grace Isaac, MD;  Location: [place];  Service: Open Heart Surgery;  Laterality: N/A;    Family History  Problem Relation Age of Onset  . Diabetes Mother   . Alzheimer's disease Mother   . Heart disease Father   . Diabetes Father   . Heart disease Brother     Social History   Tobacco Use  . Smoking status: Never Smoker  . Smokeless tobacco: Never Used  Substance Use Topics  . Alcohol use: No    Subjective:  1 month follow-up on type 2 diabetes- at last OV, medications were increased to Metformin XR 500 mg bid; tolerating well but limited improvement in blood sugar; Denies any chest pain, shortness of breath, blurred vision or headache.  Scheduled to see urology tomorrow to follow up on elevated PSA;  has taken 30 days of antibiotics;    Objective:  Vitals:   04/12/20 1149  BP: 130/70  Pulse: 65  Temp: 97.9 F (36.6 C)  TempSrc: Oral  SpO2: 95%  Weight: 177 lb 6.4 oz (80.5 kg)  Height: 5\' 9"  (1.753 m)    General: Well developed, well nourished, in no acute distress  Skin : Warm and dry.  Head: Normocephalic and atraumatic  Eyes: Sclera and conjunctiva clear; pupils round and reactive to light; extraocular movements intact  Ears: External normal; canals clear; tympanic membranes normal  Oropharynx: Pink, supple. No suspicious lesions  Neck: Supple without  thyromegaly, adenopathy  Lungs: Respirations unlabored; clear to auscultation bilaterally without wheeze, rales, rhonchi  CVS exam: normal rate and regular rhythm.  Neurologic: Alert and oriented; speech intact; face symmetrical; moves all extremities well; CNII-XII intact without focal deficit   Assessment:  1. Type 2 diabetes mellitus without complication, without long-term current use of insulin ([place])   2. Benign essential HTN   3. Coronary artery disease involving native coronary artery of native heart without angina pectoris   4. Elevated PSA, greater than or equal to 20 ng/ml     Plan:  1. Increase Metformin XR to 2 in the and 2 in the pm; plan to follow up in 2-3 months; 2. Stable; continue same medications; 3. Stressed need to get back on daily asa; he agrees; 4. Keep planned follow up with urology for tomorrow;  This visit occurred during the SARS-CoV-2 public health emergency.  Safety protocols were in place, including screening questions prior to the visit, additional usage of staff PPE, and extensive cleaning of exam room while observing appropriate contact time as indicated for disinfecting solutions.     No follow-ups on file.  No orders of the defined types were placed in this encounter.   Requested Prescriptions   Signed Prescriptions Disp Refills  . metFORMIN (GLUCOPHAGE XR) 500 MG 24 hr tablet 360 tablet 1    Sig: Take 2 tablets (1,000 mg total) by mouth in the morning and at bedtime.

## 2020-06-03 ENCOUNTER — Telehealth: Payer: Self-pay | Admitting: Family

## 2020-06-03 NOTE — Telephone Encounter (Signed)
At our last visit, he indicated he was going to come to [facility] for continuing care. Please remind him he is due for diabetes follow up in early June.

## 2020-06-03 NOTE — Telephone Encounter (Signed)
Pt called and I have scheduled to f/u on 06/17/20 @9 :20am.  Thanks.

## 2020-06-03 NOTE — Telephone Encounter (Signed)
-----   Message from [name], [place] sent at 04/12/2020  1:10 PM EDT ----- Needs diabetes follow-up end of May/ early June

## 2020-06-17 ENCOUNTER — Other Ambulatory Visit: Payer: Self-pay

## 2020-06-17 ENCOUNTER — Ambulatory Visit (INDEPENDENT_AMBULATORY_CARE_PROVIDER_SITE_OTHER): Payer: Medicare Other | Admitting: Family

## 2020-06-17 ENCOUNTER — Encounter: Payer: Self-pay | Admitting: Family

## 2020-06-17 VITALS — BP 100/60 | HR 63 | Temp 97.7°F | Ht 69.0 in | Wt 156.0 lb

## 2020-06-17 DIAGNOSIS — E119 Type 2 diabetes mellitus without complications: Secondary | ICD-10-CM | POA: Diagnosis not present

## 2020-06-17 DIAGNOSIS — I2510 Atherosclerotic heart disease of native coronary artery without angina pectoris: Secondary | ICD-10-CM | POA: Diagnosis not present

## 2020-06-17 DIAGNOSIS — R634 Abnormal weight loss: Secondary | ICD-10-CM

## 2020-06-17 LAB — CBC WITH DIFFERENTIAL/PLATELET
Basophils Absolute: 0.1 10*3/uL (ref 0.0–0.1)
Basophils Relative: 0.8 % (ref 0.0–3.0)
Eosinophils Absolute: 0.3 10*3/uL (ref 0.0–0.7)
Eosinophils Relative: 4 % (ref 0.0–5.0)
HCT: 36.9 % — ABNORMAL LOW (ref 39.0–52.0)
Hemoglobin: 11.9 g/dL — ABNORMAL LOW (ref 13.0–17.0)
Lymphocytes Relative: 28.5 % (ref 12.0–46.0)
Lymphs Abs: 2 10*3/uL (ref 0.7–4.0)
MCHC: 32.1 g/dL (ref 30.0–36.0)
MCV: 85.6 fl (ref 78.0–100.0)
Monocytes Absolute: 0.8 10*3/uL (ref 0.1–1.0)
Monocytes Relative: 11.2 % (ref 3.0–12.0)
Neutro Abs: 3.9 10*3/uL (ref 1.4–7.7)
Neutrophils Relative %: 55.5 % (ref 43.0–77.0)
Platelets: 122 10*3/uL — ABNORMAL LOW (ref 150.0–400.0)
RBC: 4.32 Mil/uL (ref 4.22–5.81)
RDW: 14.7 % (ref 11.5–15.5)
WBC: 7 10*3/uL (ref 4.0–10.5)

## 2020-06-17 LAB — HEMOGLOBIN A1C: Hgb A1c MFr Bld: 7.1 % — ABNORMAL HIGH (ref 4.6–6.5)

## 2020-06-17 LAB — TSH: TSH: 1.74 u[IU]/mL (ref 0.35–4.50)

## 2020-06-17 MED ORDER — FLUCONAZOLE 100 MG PO TABS: 100.0000 mg | ORAL_TABLET | Freq: Every day | ORAL | 0 refills | Status: DC

## 2020-06-17 NOTE — Progress Notes (Signed)
Angel Benson is a 70 y.o. male with the following history as recorded in EpicCare:  Patient Active Problem List   Diagnosis Date Noted  . Cardiomyopathy, ischemic 09/27/2014  . Routine general medical examination at a health care facility 02/01/2014  . S/P CABG x 5 08/26/2013  . Hyperlipidemia 08/25/2013  . Coronary artery disease involving native coronary artery of native heart without angina pectoris 08/25/2013  . Benign essential HTN 08/25/2013  . Type 2 diabetes mellitus with complication ([place]) 41/93/7902  . NSTEMI (non-ST elevated myocardial infarction) ([place]) 08/24/2013    Current Outpatient Medications  Medication Sig Dispense Refill  . aspirin 81 MG tablet Take 81 mg by mouth daily.    . cholecalciferol (VITAMIN D3) 25 MCG (1000 UNIT) tablet Take 1,000 Units by mouth daily.    . fluconazole (DIFLUCAN) 100 MG tablet Take 1 tablet (100 mg total) by mouth daily. 10 tablet 0  . losartan (COZAAR) 50 MG tablet Take 1 tablet (50 mg total) by mouth daily. 90 tablet 3  . metFORMIN (GLUCOPHAGE XR) 500 MG 24 hr tablet Take 2 tablets (1,000 mg total) by mouth in the morning and at bedtime. 360 tablet 1  . metoprolol succinate (TOPROL-XL) 50 MG 24 hr tablet TAKE 1 TABLET DAILY WITH OR IMMEDIATELY FOLLOWING A MEAL 90 tablet 3  . Multiple Vitamin (MULTIVITAMIN) tablet Take 1 tablet by mouth daily.    . Netarsudil-Latanoprost (ROCKLATAN) 0.02-0.005 % SOLN Apply to eye. Once daily    . Omega-3 Fatty Acids (FISH OIL) 1000 MG CAPS Take by mouth.    . rosuvastatin (CRESTOR) 40 MG tablet Take 1 tablet (40 mg total) by mouth daily. 90 tablet 3  . SIMBRINZA 1-0.2 % SUSP 1 drop Both Eyes 2 times a day    . timolol (TIMOPTIC) 0.5 % ophthalmic solution Use 1 drop in both eyes once a day     No current facility-administered medications for this visit.    Allergies: Patient has no known allergies.  Past Medical History:  Diagnosis Date  . Allergy   . CAD, multiple vessel    s/p CABG  . Chicken pox    . Diabetes mellitus without complication (HCC)    Type 2  . GERD (gastroesophageal reflux disease)   . Hypercholesteremia   . Hypertension   . Urinary tract bacterial infections     Past Surgical History:  Procedure Laterality Date  . CARDIAC CATHETERIZATION    . CORONARY ARTERY BYPASS GRAFT N/A 08/26/2013   Procedure: CORONARY ARTERY BYPASS GRAFTING (CABG) times five, using left internal mammary artery, right and left greater saphenous vein;  Surgeon: Grace Isaac, MD;  Location: [place];  Service: Open Heart Surgery;  Laterality: N/A;  LIMA-LAD; SEQ SVG-OM1-OM2-OM3; SVG-RCA   . INTRAOPERATIVE TRANSESOPHAGEAL ECHOCARDIOGRAM N/A 08/26/2013   Procedure: INTRAOPERATIVE TRANSESOPHAGEAL ECHOCARDIOGRAM;  Surgeon: Grace Isaac, MD;  Location: [place];  Service: Open Heart Surgery;  Laterality: N/A;    Family History  Problem Relation Age of Onset  . Diabetes Mother   . Alzheimer's disease Mother   . Heart disease Father   . Diabetes Father   . Heart disease Brother     Social History   Tobacco Use  . Smoking status: Never Smoker  . Smokeless tobacco: Never Used  Substance Use Topics  . Alcohol use: No    Subjective:  3 month follow up on Type 2 diabetes; no concerns for episodes of low blood sugar;  Saw urology in March- took 28 days of  Augmentin in preparation for prostate biopsy; since taking that course of antibiotics and probiotics, feels like he "has a sensitive palate" and things just don't taste right; notes very limited appetite;  Has lost 21 pounds in the past 2 months; does feel that taste buds "are getting better." No nausea, no night sweats;   Since last seen, patient has been diagnosed with prostate cancer; has planned follow up with urology in 6 months;    Objective:  Vitals:   06/17/20 0928  BP: 100/60  Pulse: 63  Temp: 97.7 F (36.5 C)  TempSrc: Oral  SpO2: 95%  Weight: 156 lb (70.8 kg)  Height: _0  (1.753 m)    General: Well developed, well  nourished, in no acute distress  Skin : Warm and dry.  Head: Normocephalic and atraumatic  Eyes: Sclera and conjunctiva clear; pupils round and reactive to light; extraocular movements intact  Ears: External normal; canals clear; tympanic membranes normal  Oropharynx: Pink, supple. No suspicious lesions; suspect thrush on tongue Neck: Supple without thyromegaly, adenopathy  Lungs: Respirations unlabored; clear to auscultation bilaterally without wheeze, rales, rhonchi  CVS exam: normal rate and regular rhythm.  Neurologic: Alert and oriented; speech intact; face symmetrical; moves all extremities well; CNII-XII intact without focal deficit   Assessment:  1. Type 2 diabetes mellitus without complication, without long-term current use of insulin ([place])   2. Weight loss   3. Coronary artery disease involving native coronary artery of native heart without angina pectoris     Plan:  1. Update labs today;  2. Suspect secondary to thrush form extended antibiotic use; Rx for Diflucan 100 mg qd x 10 days; re-check weight in 1 month; 3. Refer to cardiology;   Return in about 1 month (around 07/17/2020).  Orders Placed This Encounter  Procedures  . CBC with Differential/Platelet  . Comp Met (CMET)  . Hemoglobin A1c  . TSH  . Lipid panel  . Ambulatory referral to Cardiology    Referral Priority:   Routine    Referral Type:   Consultation    Referral Reason:   Specialty Services Required    Referred to Provider:   Pixie Casino, MD    Requested Specialty:   Cardiology    Number of Visits Requested:   1    Requested Prescriptions   Signed Prescriptions Disp Refills  . fluconazole (DIFLUCAN) 100 MG tablet 10 tablet 0    Sig: Take 1 tablet (100 mg total) by mouth daily.

## 2020-06-20 LAB — LIPID PANEL
Cholesterol: 109 mg/dL (ref 0–200)
HDL: 41.3 mg/dL (ref >39.00)
LDL Cholesterol: 51 mg/dL (ref 0–99)
NonHDL: 68
Total CHOL/HDL Ratio: 3
Triglycerides: 86 mg/dL (ref 0.0–149.0)
VLDL: 17.2 mg/dL (ref 0.0–40.0)

## 2020-06-20 LAB — COMPREHENSIVE METABOLIC PANEL
ALT: 23 U/L (ref 0–53)
AST: 27 U/L (ref 0–37)
Albumin: 4.4 g/dL (ref 3.5–5.2)
Alkaline Phosphatase: 39 U/L (ref 39–117)
BUN: 24 mg/dL — ABNORMAL HIGH (ref 6–23)
CO2: 26 mEq/L (ref 19–32)
Calcium: 9.9 mg/dL (ref 8.4–10.5)
Chloride: 103 mEq/L (ref 96–112)
Creatinine, Ser: 2.21 mg/dL — ABNORMAL HIGH (ref 0.40–1.50)
GFR: 29.88 mL/min — ABNORMAL LOW (ref >60.00)
Glucose, Bld: 105 mg/dL — ABNORMAL HIGH (ref 70–99)
Potassium: 4.9 mEq/L (ref 3.5–5.1)
Sodium: 141 mEq/L (ref 135–145)
Total Bilirubin: 0.7 mg/dL (ref 0.2–1.2)
Total Protein: 7 g/dL (ref 6.0–8.3)

## 2020-06-21 ENCOUNTER — Other Ambulatory Visit: Payer: Self-pay | Admitting: Family

## 2020-06-21 DIAGNOSIS — R899 Unspecified abnormal finding in specimens from other organs, systems and tissues: Secondary | ICD-10-CM

## 2020-06-21 NOTE — Progress Notes (Signed)
I spoke to patient; he understands to hold his Metformin for now and plan to go to [facility] on Friday morning to get his kidney functions re-checked. He notes he is feeling okay and has no specific concerns or complaints today. Reassurance that will determine treatment for his diabetes once I see his follow up labs on Friday.

## 2020-06-24 ENCOUNTER — Other Ambulatory Visit (INDEPENDENT_AMBULATORY_CARE_PROVIDER_SITE_OTHER): Payer: Medicare Other

## 2020-06-24 ENCOUNTER — Other Ambulatory Visit: Payer: Self-pay | Admitting: Family

## 2020-06-24 DIAGNOSIS — R899 Unspecified abnormal finding in specimens from other organs, systems and tissues: Secondary | ICD-10-CM | POA: Diagnosis not present

## 2020-06-24 DIAGNOSIS — D509 Iron deficiency anemia, unspecified: Secondary | ICD-10-CM

## 2020-06-24 LAB — COMPREHENSIVE METABOLIC PANEL
ALT: 27 U/L (ref 0–53)
AST: 23 U/L (ref 0–37)
Albumin: 4.3 g/dL (ref 3.5–5.2)
Alkaline Phosphatase: 38 U/L — ABNORMAL LOW (ref 39–117)
BUN: 24 mg/dL — ABNORMAL HIGH (ref 6–23)
CO2: 27 mEq/L (ref 19–32)
Calcium: 9.5 mg/dL (ref 8.4–10.5)
Chloride: 105 mEq/L (ref 96–112)
Creatinine, Ser: 1.97 mg/dL — ABNORMAL HIGH (ref 0.40–1.50)
GFR: 34.3 mL/min — ABNORMAL LOW (ref >60.00)
Glucose, Bld: 125 mg/dL — ABNORMAL HIGH (ref 70–99)
Potassium: 4.4 mEq/L (ref 3.5–5.1)
Sodium: 142 mEq/L (ref 135–145)
Total Bilirubin: 0.5 mg/dL (ref 0.2–1.2)
Total Protein: 7.1 g/dL (ref 6.0–8.3)

## 2020-06-24 LAB — CBC WITH DIFFERENTIAL/PLATELET
Basophils Absolute: 0.1 10*3/uL (ref 0.0–0.1)
Basophils Relative: 0.8 % (ref 0.0–3.0)
Eosinophils Absolute: 0.3 10*3/uL (ref 0.0–0.7)
Eosinophils Relative: 3.8 % (ref 0.0–5.0)
HCT: 35.3 % — ABNORMAL LOW (ref 39.0–52.0)
Hemoglobin: 11.5 g/dL — ABNORMAL LOW (ref 13.0–17.0)
Lymphocytes Relative: 25.6 % (ref 12.0–46.0)
Lymphs Abs: 1.8 10*3/uL (ref 0.7–4.0)
MCHC: 32.6 g/dL (ref 30.0–36.0)
MCV: 85.1 fl (ref 78.0–100.0)
Monocytes Absolute: 0.7 10*3/uL (ref 0.1–1.0)
Monocytes Relative: 9.4 % (ref 3.0–12.0)
Neutro Abs: 4.2 10*3/uL (ref 1.4–7.7)
Neutrophils Relative %: 60.4 % (ref 43.0–77.0)
Platelets: 119 10*3/uL — ABNORMAL LOW (ref 150.0–400.0)
RBC: 4.14 Mil/uL — ABNORMAL LOW (ref 4.22–5.81)
RDW: 14.7 % (ref 11.5–15.5)
WBC: 6.9 10*3/uL (ref 4.0–10.5)

## 2020-07-01 ENCOUNTER — Other Ambulatory Visit: Payer: Self-pay | Admitting: Family

## 2020-07-01 ENCOUNTER — Other Ambulatory Visit (INDEPENDENT_AMBULATORY_CARE_PROVIDER_SITE_OTHER): Payer: Medicare Other

## 2020-07-01 DIAGNOSIS — R899 Unspecified abnormal finding in specimens from other organs, systems and tissues: Secondary | ICD-10-CM

## 2020-07-01 DIAGNOSIS — D509 Iron deficiency anemia, unspecified: Secondary | ICD-10-CM | POA: Diagnosis not present

## 2020-07-01 DIAGNOSIS — D696 Thrombocytopenia, unspecified: Secondary | ICD-10-CM

## 2020-07-01 DIAGNOSIS — D649 Anemia, unspecified: Secondary | ICD-10-CM

## 2020-07-01 LAB — CBC WITH DIFFERENTIAL/PLATELET
Basophils Absolute: 0.1 10*3/uL (ref 0.0–0.1)
Basophils Relative: 0.9 % (ref 0.0–3.0)
Eosinophils Absolute: 0.2 10*3/uL (ref 0.0–0.7)
Eosinophils Relative: 3.8 % (ref 0.0–5.0)
HCT: 34.4 % — ABNORMAL LOW (ref 39.0–52.0)
Hemoglobin: 11.1 g/dL — ABNORMAL LOW (ref 13.0–17.0)
Lymphocytes Relative: 28.4 % (ref 12.0–46.0)
Lymphs Abs: 1.7 10*3/uL (ref 0.7–4.0)
MCHC: 32.2 g/dL (ref 30.0–36.0)
MCV: 85.7 fl (ref 78.0–100.0)
Monocytes Absolute: 0.6 10*3/uL (ref 0.1–1.0)
Monocytes Relative: 10.1 % (ref 3.0–12.0)
Neutro Abs: 3.5 10*3/uL (ref 1.4–7.7)
Neutrophils Relative %: 56.8 % (ref 43.0–77.0)
Platelets: 114 10*3/uL — ABNORMAL LOW (ref 150.0–400.0)
RBC: 4.01 Mil/uL — ABNORMAL LOW (ref 4.22–5.81)
RDW: 15.2 % (ref 11.5–15.5)
WBC: 6.1 10*3/uL (ref 4.0–10.5)

## 2020-07-01 LAB — BASIC METABOLIC PANEL
BUN: 22 mg/dL (ref 6–23)
CO2: 27 mEq/L (ref 19–32)
Calcium: 9.2 mg/dL (ref 8.4–10.5)
Chloride: 108 mEq/L (ref 96–112)
Creatinine, Ser: 1.75 mg/dL — ABNORMAL HIGH (ref 0.40–1.50)
GFR: 39.53 mL/min — ABNORMAL LOW (ref >60.00)
Glucose, Bld: 113 mg/dL — ABNORMAL HIGH (ref 70–99)
Potassium: 4.8 mEq/L (ref 3.5–5.1)
Sodium: 144 mEq/L (ref 135–145)

## 2020-07-01 LAB — FERRITIN: Ferritin: 286.2 ng/mL (ref 22.0–322.0)

## 2020-07-02 LAB — IRON AND TIBC
Iron Saturation: 40 % (ref 15–55)
Iron: 95 ug/dL (ref 38–169)
Total Iron Binding Capacity: 236 ug/dL — ABNORMAL LOW (ref 250–450)
UIBC: 141 ug/dL (ref 111–343)

## 2020-07-04 NOTE — Progress Notes (Signed)
Patient has been referred to hematology/ oncology;

## 2020-07-11 ENCOUNTER — Telehealth: Payer: Self-pay | Admitting: Physician Assistant

## 2020-07-11 NOTE — Telephone Encounter (Signed)
Received a new hem referral from Jodi Mourning, FNP for anemia and decreased platelets. Angel Benson has been cld and scheduled to see Cassie on 6/30 at 1:30pm w/labs at 1pm. Pt aware to arrive 15 minutes early.

## 2020-07-12 ENCOUNTER — Other Ambulatory Visit: Payer: Self-pay | Admitting: Medical Oncology

## 2020-07-12 DIAGNOSIS — D649 Anemia, unspecified: Secondary | ICD-10-CM

## 2020-07-13 ENCOUNTER — Other Ambulatory Visit: Payer: Self-pay | Admitting: Physician Assistant

## 2020-07-13 DIAGNOSIS — D649 Anemia, unspecified: Secondary | ICD-10-CM

## 2020-07-13 DIAGNOSIS — D696 Thrombocytopenia, unspecified: Secondary | ICD-10-CM

## 2020-07-13 NOTE — Progress Notes (Signed)
[place] [phone]   [phone]  CONSULT NOTE  REFERRING PHYSICIAN: [name]  REASON FOR CONSULTATION:  Thrombocytopenia and anemia  HPI Angel Benson is a 70 y.o. male with a past medical history significant for NSTEMI, coronary artery disease, hypertension, cardiomyopathy, type 2 diabetes, status post CABG, and hyperlipidemia. He recently reports a diagnosis of prostate cancer which is being closely followed by an established urologist. He is scheduled to see the urologist again in September/October. He is referred to the clinic for evaluation of thrombocytopenia and anemia. He presents to the clinic today by himself.  The patient recently had a follow-up visit with his primary care provider on 06/17/20 for 72-monthdiabetes follow-up.  He also was endorsing taste alterations and associated weight loss.  He had labs performed on 07/01/2020 which showed a normal white blood cell count at 6.1, hemoglobin slightly low at 11.1 with a normal MCV 85.7 and a normal RDW at 15.2, his platelets were slightly low at 114 K.  The patient had had some mild anemia and mild thrombocytopenia for 3 weeks in a row from 06/18/2018 she just 07/01/2020.  The patient had some iron studies performed which showed a ferritin of 286.2, and iron within normal limits at 236, U IBC within normal limits at 141, normal iron at 95, and a saturation within normal limits at 40%.  The patient's CMP showed some worsening kidney function which was thought to be related to his metformin. The patient was referred to the clinic today for further evaluation management regarding these findings.  Overall the patient is feeling well today without concerning complaints. He denies any current fatigue or changes in his activity levels as he is completely independent. As noted by his PCP, he reports previous taste alterations related to medication which resulted in a 20-30 pound weight loss. He  states his appetite and taste is back at baseline, and he has not had further issues. His PCP placed him on fluconazole to possibly try and resolve this problem, likely for possible thrush. He reports previously having changes in his bowel movements with possible dark stools, but since changing his medication he reports his stool color as brown. He denies any noted blood in the stool or any diarrhea. The patient's last colonoscopy was in August 2019 which was performed under the care of Dr. DLoletha Carrowwhich the patient had a few polyps consistent with tubular adenoma. He was instructed to have his next colonoscopy in 5 years which will be due in 2 years. He reports previously having occasional gum bleeding only when brushing his teeth. His only reported pain is in his left great toe which he attributes to toenail growth. He has experienced mild chronic lower left leg edema as a result of his CABG procedure in 2015.  Regarding the anemia, per chart review, the patient has intermittent anemia periodically.  I can see that the patient had some anemia 6 years ago in August 2015 with a hemoglobin at the lowest 9.2.  At this time, it appears that the patient underwent coronary bypass procedure.  The patient denies taking any iron supplements currently although reports he did in the past. He is unsure how long he took iron or when he stopped taking iron, but he states "it has been a while." He states his PCP has monitored this and instructed him to discontinue the iron. He is taking vitamin D and denies any history of vitamin deficiencies. The patient is currently  not taking blood thinners except for aspirin 81 mg daily. He denies other NSAID use or history of ulcers. The patient denies any abnormal bleeding or bruising including epistaxis, current gingival bleeding, hemoptysis, hematemesis, melena, hematochezia, or hematuria.  The patient denies any lightheadedness, palpitations, shortness of breath, or pallor.  The patient  denies any particular dietary habits such as being a vegan or vegetarian although he does report he only eats red meat occasionally per preference. The patient denies any history of bariatric surgery.    Regarding the thrombocytopenia, the patient has had intermittent mild thrombocytopenia dating back to 2015.  The lowest platelet count was 82,000 which also was during his coronary artery bypass procedure.  The patient has underwent prior surgeries and denies any history of bleeding complications.  The patient denies any fever, chills, night sweats, or unexplained weight loss. The patient denies any herbal supplement use. The patient denies use of any immunosuppressive drugs.  The patient denies frequent infections or recent infections.  The patient denies any history of hepatitis, mono, or HIV.  The patient denies any history of liver disease.  The patient denies any history of autoimmune disorders.  The patient served in the [employer] for 21 years and then worked for [employer]. He is currently retired. He is divorced and currently single. He denies any history of tobacco use or current use. He denies alcohol or any other substance use.   His family history is significant for a sister who passed in 2018 of ovarian cancer. He has another sibling with diabetes and two cousins with prostate cancer. He has one healthy son as far as the patient is aware, and he denies any family history of blood disorders or cancers.    HPI  Past Medical History:  Diagnosis Date   Allergy    CAD, multiple vessel    s/p CABG   Chicken pox    Diabetes mellitus without complication (HCC)    Type 2   GERD (gastroesophageal reflux disease)    Hypercholesteremia    Hypertension    Urinary tract bacterial infections     Past Surgical History:  Procedure Laterality Date   CARDIAC CATHETERIZATION     CORONARY ARTERY BYPASS GRAFT N/A 08/26/2013   Procedure: CORONARY ARTERY BYPASS GRAFTING  (CABG) times five, using left internal mammary artery, right and left greater saphenous vein;  Surgeon: Grace Isaac, MD;  Location: [facility];  Service: Open Heart Surgery;  Laterality: N/A;  LIMA-LAD; SEQ SVG-OM1-OM2-OM3; SVG-RCA    INTRAOPERATIVE TRANSESOPHAGEAL ECHOCARDIOGRAM N/A 08/26/2013   Procedure: INTRAOPERATIVE TRANSESOPHAGEAL ECHOCARDIOGRAM;  Surgeon: Grace Isaac, MD;  Location: [facility];  Service: Open Heart Surgery;  Laterality: N/A;    Family History  Problem Relation Age of Onset   Diabetes Mother    Alzheimer's disease Mother    Heart disease Father    Diabetes Father    Heart disease Brother     Social History Social History   Tobacco Use   Smoking status: Never   Smokeless tobacco: Never  Vaping Use   Vaping Use: Never used  Substance Use Topics   Alcohol use: No   Drug use: No    No Known Allergies  Current Outpatient Medications  Medication Sig Dispense Refill   aspirin 81 MG tablet Take 81 mg by mouth daily.     cholecalciferol (VITAMIN D3) 25 MCG (1000 UNIT) tablet Take 1,000 Units by mouth daily.     fluconazole (DIFLUCAN) 100 MG  tablet Take 1 tablet (100 mg total) by mouth daily. 10 tablet 0   losartan (COZAAR) 50 MG tablet Take 1 tablet (50 mg total) by mouth daily. 90 tablet 3   metFORMIN (GLUCOPHAGE XR) 500 MG 24 hr tablet Take 2 tablets (1,000 mg total) by mouth in the morning and at bedtime. 360 tablet 1   metoprolol succinate (TOPROL-XL) 50 MG 24 hr tablet TAKE 1 TABLET DAILY WITH OR IMMEDIATELY FOLLOWING A MEAL 90 tablet 3   Multiple Vitamin (MULTIVITAMIN) tablet Take 1 tablet by mouth daily.     Netarsudil-Latanoprost (ROCKLATAN) 0.02-0.005 % SOLN Apply to eye. Once daily     Omega-3 Fatty Acids (FISH OIL) 1000 MG CAPS Take by mouth.     rosuvastatin (CRESTOR) 40 MG tablet Take 1 tablet (40 mg total) by mouth daily. 90 tablet 3   SIMBRINZA 1-0.2 % SUSP 1 drop Both Eyes 2 times a day     timolol (TIMOPTIC) 0.5 % ophthalmic solution Use 1  drop in both eyes once a day     No current facility-administered medications for this visit.    REVIEW OF SYSTEMS:   Review of Systems  Constitutional: Negative for appetite change, chills, fatigue, fever and unexpected weight change.  HENT: Negative for mouth sores, nosebleeds, sore throat and trouble swallowing.   Eyes: Negative for eye problems and icterus.  Respiratory: Negative for cough, hemoptysis, shortness of breath and wheezing.   Cardiovascular: Positive chronic leg swelling (L>R). Negative for chest pain  Gastrointestinal: Negative for abdominal pain, constipation, diarrhea, nausea and vomiting.  Genitourinary: Negative for bladder incontinence, difficulty urinating, dysuria, frequency and hematuria.   Musculoskeletal: Negative for back pain, gait problem, neck pain and neck stiffness.  Skin: Positive for left toe pain. Negative for itching and rash.  Neurological: Negative for dizziness, extremity weakness, gait problem, headaches, light-headedness and seizures.  Hematological: Negative for adenopathy. Does not bruise/bleed easily.  Psychiatric/Behavioral: Negative for confusion, depression and sleep disturbance. The patient is not nervous/anxious.     PHYSICAL EXAMINATION:  There were no vitals taken for this visit.  ECOG PERFORMANCE STATUS: 1 - Symptomatic but completely ambulatory  Physical Exam  Constitutional: Oriented to person, place, and time and well-developed, well-nourished, and in no distress.  HENT:  Head: Normocephalic and atraumatic.  Mouth/Throat: Oropharynx is clear and moist. No oropharyngeal exudate.  Eyes: Conjunctivae are normal. Right eye exhibits no discharge. Left eye exhibits no discharge. No scleral icterus.  Neck: Normal range of motion. Neck supple.  Cardiovascular: Normal rate, regular rhythm, normal heart sounds and intact distal pulses.   Pulmonary/Chest: Effort normal and breath sounds normal. No respiratory distress. No wheezes. No  rales.  Abdominal: Soft. Bowel sounds are normal. Exhibits no distension and no mass. There is no tenderness.  Musculoskeletal: Normal range of motion. Mild non-pitting edema to the left lower ankle.  Lymphadenopathy: No cervical adenopathy.  Neurological: Alert and oriented to person, place, and time. Exhibits normal muscle tone. Gait normal. Coordination normal.  Skin: Skin is warm and dry. No rash noted. Not diaphoretic. No erythema. No pallor.  Psychiatric: Mood, memory and judgment normal.  Vitals reviewed.  LABORATORY DATA: Lab Results  Component Value Date   WBC 6.1 07/01/2020   HGB 11.1 (L) 07/01/2020   HCT 34.4 (L) 07/01/2020   MCV 85.7 07/01/2020   PLT 114.0 (L) 07/01/2020      Chemistry      Component Value Date/Time   NA 144 07/01/2020 0816   K 4.8 07/01/2020  0816   CL 108 07/01/2020 0816   CO2 27 07/01/2020 0816   BUN 22 07/01/2020 0816   CREATININE 1.75 (H) 07/01/2020 0816   CREATININE 1.29 (H) 08/25/2019 1140      Component Value Date/Time   CALCIUM 9.2 07/01/2020 0816   ALKPHOS 38 (L) 06/24/2020 0815   AST 23 06/24/2020 0815   ALT 27 06/24/2020 0815   BILITOT 0.5 06/24/2020 0815       RADIOGRAPHIC STUDIES: No results found.  ASSESSMENT: This is a very pleasant 70 year old African-American male referred to the clinic for anemia and thrombocytopenia.   PLAN: The patient was seen with Dr. Julien Nordmann today.  The patient had several lab studies performed including a CBC, CMP, LDH, acute hepatitis panel, HIV testing, vitamin B28, folic acid, ANA, SPEP with IFE, and stool cards.   The patient's lab work today shows a normal WBC of 6.4, normocytic anemia with a MCV of 90.0 and Hbg of 11.4. His platelet count is slightly low at 136k. His CMP is WNL except for glucose of 107 and creatinine of 1.63 which appears to be slowly improving compared to 3 weeks ago. His B12 is low at 170. His LDH is WNL. His other lab studies are pending at this time.   We will arrange  for the patient to receive B12 injections weekly x4 followed by monthly for the B12 deficiency.   We will see him back for a follow up visit in 1 month for evaluation and a repeat CBC. If no other abnormal findings on the pending lab studies and no improvement with correction of B12, then Dr. Julien Nordmann discussed the next step in evaluation would likely involve a bone marrow biopsy and aspirate. However, we will start with the lab studies before determining if this invasive evaluation is needed.   We will see him back for a follow up visit in 3-4 weeks for evaluation and repeat blood work.   He was advised to return the stool cards at his earliest convenience if positive for blood, we would recommend he follow up with his gastroenterologist.   The patient voices understanding of current disease status and treatment options and is in agreement with the current care plan.  All questions were answered. The patient knows to call the clinic with any problems, questions or concerns. We can certainly see the patient much sooner if necessary.  Thank you so much for allowing me to participate in the care of Angel Benson. I will continue to follow up the patient with you and assist in his care.   Disclaimer: This note was dictated with voice recognition software. Similar sounding words can inadvertently be transcribed and may not be corrected upon review.   Jerrett Baldinger L Danny Zimny July 13, 2020, 3:34 PM  ADDENDUM: Hematology/Oncology Attending: I had a face-to-face encounter with the patient today.  I reviewed his records, lab and recommended his care plan.  This is a very pleasant 70 years old African-American male with multiple medical problems including coronary artery disease, cardiomyopathy, diabetes mellitus, hypertension and also history of prostate cancer followed by urology.  The patient was seen by his primary care physician in early June 2022 for the routine follow-up visit and evaluation  of his diabetes.  He had blood work performed on 07/01/2020 showed low hemoglobin of 11.1 as well as mild thrombocytopenia with platelets count of 1 14,000.  He had similar result earlier that months.  He did have iron study and ferritin that were within the normal  range.  He was referred to [place] today for evaluation and recommendation regarding his condition. The patient was referred to [place] today for evaluation and recommendation regarding his condition. Will order extensive blood work today including repeat CBC and that showed hemoglobin of 11.4 and hematocrit 36.1% with platelets count of 136.  Vitamin B12 level was low at 170.  Other blood work are still pending including hepatitis panel, HIV, ANA, serum protein electrophoresis with immunofixation, rheumatoid factor.  We will also give the patient stool cards for Hemoccult. Will start the patient on vitamin B12 injection weekly for the next 4 weeks and then monthly We will arrange for the patient to come back for follow-up visit in around 3-4 weeks for evaluation and repeat CBC. The patient was advised to call immediately if he has any other concerning symptoms in the interval. The total time spent in the appointment was 60 minutes.  Disclaimer: This note was dictated with voice recognition software. Similar sounding words can inadvertently be transcribed and may be missed upon review. Eilleen Kempf, MD 07/14/20

## 2020-07-14 ENCOUNTER — Telehealth: Payer: Self-pay | Admitting: Physician Assistant

## 2020-07-14 ENCOUNTER — Other Ambulatory Visit: Payer: Self-pay

## 2020-07-14 ENCOUNTER — Inpatient Hospital Stay: Payer: Medicare Other

## 2020-07-14 ENCOUNTER — Inpatient Hospital Stay: Payer: Medicare Other | Attending: Physician Assistant | Admitting: Physician Assistant

## 2020-07-14 ENCOUNTER — Encounter: Payer: Self-pay | Admitting: Physician Assistant

## 2020-07-14 VITALS — BP 145/77 | HR 67 | Temp 97.1°F | Resp 19 | Ht 69.0 in | Wt 164.9 lb

## 2020-07-14 DIAGNOSIS — E7800 Pure hypercholesterolemia, unspecified: Secondary | ICD-10-CM | POA: Insufficient documentation

## 2020-07-14 DIAGNOSIS — M79675 Pain in left toe(s): Secondary | ICD-10-CM | POA: Diagnosis not present

## 2020-07-14 DIAGNOSIS — C61 Malignant neoplasm of prostate: Secondary | ICD-10-CM | POA: Insufficient documentation

## 2020-07-14 DIAGNOSIS — I429 Cardiomyopathy, unspecified: Secondary | ICD-10-CM | POA: Insufficient documentation

## 2020-07-14 DIAGNOSIS — I2510 Atherosclerotic heart disease of native coronary artery without angina pectoris: Secondary | ICD-10-CM | POA: Diagnosis not present

## 2020-07-14 DIAGNOSIS — D519 Vitamin B12 deficiency anemia, unspecified: Secondary | ICD-10-CM | POA: Diagnosis not present

## 2020-07-14 DIAGNOSIS — D696 Thrombocytopenia, unspecified: Secondary | ICD-10-CM | POA: Diagnosis not present

## 2020-07-14 DIAGNOSIS — E538 Deficiency of other specified B group vitamins: Secondary | ICD-10-CM | POA: Insufficient documentation

## 2020-07-14 DIAGNOSIS — E119 Type 2 diabetes mellitus without complications: Secondary | ICD-10-CM | POA: Insufficient documentation

## 2020-07-14 DIAGNOSIS — M7989 Other specified soft tissue disorders: Secondary | ICD-10-CM | POA: Diagnosis not present

## 2020-07-14 DIAGNOSIS — Z8249 Family history of ischemic heart disease and other diseases of the circulatory system: Secondary | ICD-10-CM | POA: Diagnosis not present

## 2020-07-14 DIAGNOSIS — Z79899 Other long term (current) drug therapy: Secondary | ICD-10-CM | POA: Insufficient documentation

## 2020-07-14 DIAGNOSIS — R600 Localized edema: Secondary | ICD-10-CM | POA: Diagnosis not present

## 2020-07-14 DIAGNOSIS — Z833 Family history of diabetes mellitus: Secondary | ICD-10-CM | POA: Insufficient documentation

## 2020-07-14 DIAGNOSIS — I252 Old myocardial infarction: Secondary | ICD-10-CM | POA: Insufficient documentation

## 2020-07-14 DIAGNOSIS — D649 Anemia, unspecified: Secondary | ICD-10-CM

## 2020-07-14 DIAGNOSIS — I10 Essential (primary) hypertension: Secondary | ICD-10-CM | POA: Insufficient documentation

## 2020-07-14 DIAGNOSIS — Z818 Family history of other mental and behavioral disorders: Secondary | ICD-10-CM | POA: Diagnosis not present

## 2020-07-14 LAB — CBC WITH DIFFERENTIAL (CANCER CENTER ONLY)
Abs Immature Granulocytes: 0.01 10*3/uL (ref 0.00–0.07)
Basophils Absolute: 0 10*3/uL (ref 0.0–0.1)
Basophils Relative: 1 %
Eosinophils Absolute: 0.2 10*3/uL (ref 0.0–0.5)
Eosinophils Relative: 3 %
HCT: 36.1 % — ABNORMAL LOW (ref 39.0–52.0)
Hemoglobin: 11.4 g/dL — ABNORMAL LOW (ref 13.0–17.0)
Immature Granulocytes: 0 %
Lymphocytes Relative: 24 %
Lymphs Abs: 1.5 10*3/uL (ref 0.7–4.0)
MCH: 28.4 pg (ref 26.0–34.0)
MCHC: 31.6 g/dL (ref 30.0–36.0)
MCV: 90 fL (ref 80.0–100.0)
Monocytes Absolute: 0.6 10*3/uL (ref 0.1–1.0)
Monocytes Relative: 9 %
Neutro Abs: 4 10*3/uL (ref 1.7–7.7)
Neutrophils Relative %: 63 %
Platelet Count: 136 10*3/uL — ABNORMAL LOW (ref 150–400)
RBC: 4.01 MIL/uL — ABNORMAL LOW (ref 4.22–5.81)
RDW: 16 % — ABNORMAL HIGH (ref 11.5–15.5)
WBC Count: 6.4 10*3/uL (ref 4.0–10.5)
nRBC: 0 % (ref 0.0–0.2)

## 2020-07-14 LAB — LACTATE DEHYDROGENASE: LDH: 168 U/L (ref 98–192)

## 2020-07-14 LAB — CMP (CANCER CENTER ONLY)
ALT: 26 U/L (ref 0–44)
AST: 21 U/L (ref 15–41)
Albumin: 3.9 g/dL (ref 3.5–5.0)
Alkaline Phosphatase: 55 U/L (ref 38–126)
Anion gap: 10 (ref 5–15)
BUN: 18 mg/dL (ref 8–23)
CO2: 26 mmol/L (ref 22–32)
Calcium: 9.2 mg/dL (ref 8.9–10.3)
Chloride: 107 mmol/L (ref 98–111)
Creatinine: 1.63 mg/dL — ABNORMAL HIGH (ref 0.61–1.24)
GFR, Estimated: 46 mL/min — ABNORMAL LOW (ref >60)
Glucose, Bld: 107 mg/dL — ABNORMAL HIGH (ref 70–99)
Potassium: 4.4 mmol/L (ref 3.5–5.1)
Sodium: 143 mmol/L (ref 135–145)
Total Bilirubin: 0.4 mg/dL (ref 0.3–1.2)
Total Protein: 7.6 g/dL (ref 6.5–8.1)

## 2020-07-14 LAB — HIV ANTIBODY (ROUTINE TESTING W REFLEX): HIV Screen 4th Generation wRfx: NONREACTIVE

## 2020-07-14 LAB — FOLATE: Folate: 28.7 ng/mL

## 2020-07-14 LAB — VITAMIN B12: Vitamin B-12: 170 pg/mL — ABNORMAL LOW (ref 180–914)

## 2020-07-14 NOTE — Telephone Encounter (Signed)
Scheduled appts per 6/30 sch msg. Pt aware.  

## 2020-07-15 LAB — PROTEIN ELECTROPHORESIS, SERUM, WITH REFLEX
A/G Ratio: 1.3 (ref 0.7–1.7)
Albumin ELP: 3.8 g/dL (ref 2.9–4.4)
Alpha-1-Globulin: 0.2 g/dL (ref 0.0–0.4)
Alpha-2-Globulin: 0.6 g/dL (ref 0.4–1.0)
Beta Globulin: 1 g/dL (ref 0.7–1.3)
Gamma Globulin: 1.1 g/dL (ref 0.4–1.8)
Globulin, Total: 2.9 g/dL (ref 2.2–3.9)
Total Protein ELP: 6.7 g/dL (ref 6.0–8.5)

## 2020-07-15 LAB — RHEUMATOID FACTOR: Rheumatoid fact SerPl-aCnc: <10 IU/mL (ref <14.0)

## 2020-07-15 LAB — HEPATITIS PANEL, ACUTE
HCV Ab: NONREACTIVE
Hep A IgM: NONREACTIVE
Hep B C IgM: NONREACTIVE
Hepatitis B Surface Ag: NONREACTIVE

## 2020-07-19 ENCOUNTER — Other Ambulatory Visit: Payer: Self-pay

## 2020-07-19 ENCOUNTER — Encounter: Payer: Self-pay | Admitting: Family

## 2020-07-19 ENCOUNTER — Ambulatory Visit (INDEPENDENT_AMBULATORY_CARE_PROVIDER_SITE_OTHER): Payer: Medicare Other | Admitting: Family

## 2020-07-19 VITALS — BP 138/70 | HR 65 | Temp 97.9°F | Ht 69.0 in | Wt 163.6 lb

## 2020-07-19 DIAGNOSIS — E118 Type 2 diabetes mellitus with unspecified complications: Secondary | ICD-10-CM | POA: Diagnosis not present

## 2020-07-19 DIAGNOSIS — D649 Anemia, unspecified: Secondary | ICD-10-CM

## 2020-07-19 DIAGNOSIS — I10 Essential (primary) hypertension: Secondary | ICD-10-CM | POA: Diagnosis not present

## 2020-07-19 DIAGNOSIS — E785 Hyperlipidemia, unspecified: Secondary | ICD-10-CM | POA: Diagnosis not present

## 2020-07-19 MED ORDER — METOPROLOL SUCCINATE ER 50 MG PO TB24: ORAL_TABLET | ORAL | 3 refills | Status: DC

## 2020-07-19 MED ORDER — ROSUVASTATIN CALCIUM 40 MG PO TABS: 40.0000 mg | ORAL_TABLET | Freq: Every day | ORAL | 3 refills | Status: DC

## 2020-07-19 MED ORDER — LOSARTAN POTASSIUM 50 MG PO TABS: 50.0000 mg | ORAL_TABLET | Freq: Every day | ORAL | 3 refills | Status: DC

## 2020-07-19 NOTE — Progress Notes (Signed)
Angel Benson is a 70 y.o. male with the following history as recorded in EpicCare:  Patient Active Problem List   Diagnosis Date Noted   B12 deficiency 07/14/2020   Anemia 07/14/2020   Thrombocytopenia ([place]) 07/14/2020   Cardiomyopathy, ischemic 09/27/2014   Routine general medical examination at a health care facility 02/01/2014   S/P CABG x 5 08/26/2013   Hyperlipidemia 08/25/2013   Coronary artery disease involving native coronary artery of native heart without angina pectoris 08/25/2013   Benign essential HTN 08/25/2013   Type 2 diabetes mellitus with complication ([place]) 73/53/2992   NSTEMI (non-ST elevated myocardial infarction) ([place]) 08/24/2013    Current Outpatient Medications  Medication Sig Dispense Refill   aspirin 81 MG tablet Take 81 mg by mouth daily.     cholecalciferol (VITAMIN D3) 25 MCG (1000 UNIT) tablet Take 1,000 Units by mouth daily.     Multiple Vitamin (MULTIVITAMIN) tablet Take 1 tablet by mouth daily.     Netarsudil-Latanoprost (ROCKLATAN) 0.02-0.005 % SOLN Apply to eye. Once daily     Omega-3 Fatty Acids (FISH OIL) 1000 MG CAPS Take by mouth.     SIMBRINZA 1-0.2 % SUSP 1 drop Both Eyes 2 times a day     timolol (TIMOPTIC) 0.5 % ophthalmic solution Use 1 drop in both eyes once a day     losartan (COZAAR) 50 MG tablet Take 1 tablet (50 mg total) by mouth daily. 90 tablet 3   metoprolol succinate (TOPROL-XL) 50 MG 24 hr tablet TAKE 1 TABLET DAILY WITH OR IMMEDIATELY FOLLOWING A MEAL 90 tablet 3   rosuvastatin (CRESTOR) 40 MG tablet Take 1 tablet (40 mg total) by mouth daily. 90 tablet 3   No current facility-administered medications for this visit.    Allergies: Patient has no known allergies.  Past Medical History:  Diagnosis Date   Allergy    CAD, multiple vessel    s/p CABG   Chicken pox    Diabetes mellitus without complication (HCC)    Type 2   GERD (gastroesophageal reflux disease)    Hypercholesteremia    Hypertension    Urinary tract  bacterial infections     Past Surgical History:  Procedure Laterality Date   CARDIAC CATHETERIZATION     CORONARY ARTERY BYPASS GRAFT N/A 08/26/2013   Procedure: CORONARY ARTERY BYPASS GRAFTING (CABG) times five, using left internal mammary artery, right and left greater saphenous vein;  Surgeon: Grace Isaac, MD;  Location: [facility];  Service: Open Heart Surgery;  Laterality: N/A;  LIMA-LAD; SEQ SVG-OM1-OM2-OM3; SVG-RCA    INTRAOPERATIVE TRANSESOPHAGEAL ECHOCARDIOGRAM N/A 08/26/2013   Procedure: INTRAOPERATIVE TRANSESOPHAGEAL ECHOCARDIOGRAM;  Surgeon: Grace Isaac, MD;  Location: [facility];  Service: Open Heart Surgery;  Laterality: N/A;    Family History  Problem Relation Age of Onset   Diabetes Mother    Alzheimer's disease Mother    Heart disease Father    Diabetes Father    Heart disease Brother     Social History   Tobacco Use   Smoking status: Never    Passive exposure: Past   Smokeless tobacco: Never  Substance Use Topics   Alcohol use: No    Subjective:   1 month follow up on weight loss- thought to be related to complications from extended Augmentin prescription; has completed Diflucan and does feel that taste buds have normalized; feels that appetite has returned and is eating again; pleased to see that he has gained weight since last OV- up 7 pounds;  Metformin has been discontinued due to elevated creatinine; blood sugar doing well off medication; creatinine has continued to improve; average blood sugar 109;   Under care of hematology/ oncology- starting B12 shots tomorrow; scheduled for follow up there later this month; scheduled to see his cardiologist in early August as well;   Objective:  Vitals:   07/19/20 1033  BP: 138/70  Pulse: 65  Temp: 97.9 F (36.6 C)  TempSrc: Oral  SpO2: 99%  Weight: 163 lb 9.6 oz (74.2 kg)  Height: 5\' 9"  (1.753 m)    General: Well developed, well nourished, in no acute distress  Skin : Warm and dry.  Head: Normocephalic  and atraumatic  Eyes: Sclera and conjunctiva clear; pupils round and reactive to light; extraocular movements intact  Ears: External normal; canals clear; tympanic membranes normal  Oropharynx: Pink, supple. No suspicious lesions  Neck: Supple without thyromegaly, adenopathy  Lungs: Respirations unlabored; clear to auscultation bilaterally without wheeze, rales, rhonchi  CVS exam: normal rate and regular rhythm.  Neurologic: Alert and oriented; speech intact; face symmetrical; moves all extremities well; CNII-XII intact without focal deficit   Assessment:  1. Anemia, unspecified type   2. Type 2 diabetes mellitus with complication (HCC)   3. Benign essential HTN   4. Hyperlipidemia, unspecified hyperlipidemia type     Plan:  Continue with oncology as scheduled; Diet controlled- continue to monitor; most recent Hgba1c with home health nurse was 7.1; Stable; refill updated; Stable; refill updated;  This visit occurred during the SARS-CoV-2 public health emergency.  Safety protocols were in place, including screening questions prior to the visit, additional usage of staff PPE, and extensive cleaning of exam room while observing appropriate contact time as indicated for disinfecting solutions.    No follow-ups on file.  No orders of the defined types were placed in this encounter.   Requested Prescriptions   Signed Prescriptions Disp Refills   losartan (COZAAR) 50 MG tablet 90 tablet 3    Sig: Take 1 tablet (50 mg total) by mouth daily.   metoprolol succinate (TOPROL-XL) 50 MG 24 hr tablet 90 tablet 3    Sig: TAKE 1 TABLET DAILY WITH OR IMMEDIATELY FOLLOWING A MEAL   rosuvastatin (CRESTOR) 40 MG tablet 90 tablet 3    Sig: Take 1 tablet (40 mg total) by mouth daily.

## 2020-07-20 ENCOUNTER — Inpatient Hospital Stay: Payer: Medicare Other | Attending: Physician Assistant

## 2020-07-20 DIAGNOSIS — K921 Melena: Secondary | ICD-10-CM | POA: Diagnosis not present

## 2020-07-20 DIAGNOSIS — E538 Deficiency of other specified B group vitamins: Secondary | ICD-10-CM | POA: Insufficient documentation

## 2020-07-20 DIAGNOSIS — C61 Malignant neoplasm of prostate: Secondary | ICD-10-CM | POA: Insufficient documentation

## 2020-07-20 MED ORDER — CYANOCOBALAMIN 1000 MCG/ML IJ SOLN
1000.0000 ug | Freq: Once | INTRAMUSCULAR | Status: AC
Start: 2020-07-20 — End: 2020-07-20
  Administered 2020-07-20: 1000 ug via INTRAMUSCULAR

## 2020-07-20 MED ORDER — CYANOCOBALAMIN 1000 MCG/ML IJ SOLN
INTRAMUSCULAR | Status: AC
  Filled 2020-07-20: qty 1

## 2020-07-20 NOTE — Patient Instructions (Signed)
Cyanocobalamin, Vitamin B12 injection O que  este medicamento? A CIANOCOBALAMINA  uma forma sinttica de vitamina B12. A vitamina B12  essencial para o desenvolvimento de glbulos sanguneos, clulas nervosas e protenas saudveis pelo organismo. Tambm ajuda no metabolismo das gorduras e carboidratos. Este medicamento  usado para tratar pessoas que no conseguem absorver vitamina B12 suficiente. Este medicamento pode ser usado para outros propsitos; em caso de dvidas, pergunte ao seu profissional de sade ou farmacutico. NOMES DE MARCAS COMUNS: B-12 Compliance Kit, B-12 Injection Kit, Cyomin, LA-12, Nutri-Twelve, Physicians EZ Use B-12, Primabalt O que devo dizer a meu profissional de sade antes de tomar este medicamento? Precisam saber se voc tem algum dos seguintes problemas ou estados de sade: doenas renais sndrome ou doena de Leber anemia megaloblstica reao estranha ou alergia  cianocobalamina ou ao cobalto reao estranha ou alergia a outros medicamentos, alimentos, corantes ou conservantes est grvida ou tentando engravidar est amamentando Como devo usar este medicamento? Este medicamento  injetado por via intramuscular ou por injeo subcutnea profunda. Costuma ser administrado por um profissional de sade em consultrio ou Chief of Staff. Porm,  possvel que seu mdico lhe ensine como aplicar suas prprias injees. Siga todas as instrues. Fale com seu pediatra a respeito do uso deste medicamento em crianas. Pode ser preciso tomar alguns cuidados especiais. Superdosagem: Se achar que tomou uma superdosagem deste medicamento, entre em contato imediatamente com o [facility] ou v a [employer]. OBSERVAO: Este medicamento  s para voc. No compartilhe este medicamento com outras pessoas. E se eu deixar de tomar uma dose? Se toma o medicamento em uma clnica ou no consultrio do seu mdico, ligue para Paramedic a Passenger transport manager. Se aplica as  injees por conta prpria e perdeu uma dose, tome-a assim que possvel. Se j estiver quase na hora da sua prxima dose, tome somente essa dose. No tome o remdio em dobro, nem tome uma dose adicional. O que pode interagir com este medicamento? colchicina consumo pesado de lcool Esta lista pode no descrever todas as interaes possveis. D ao seu profissional de sade uma lista de todos os medicamentos, ervas medicinais, remdios de venda livre, ou suplementos alimentares que voc [place]. Diga tambm se voc fuma, bebe, ou [place] drogas ilcitas. Alguns destes podem interagir com o seu medicamento. Ao que devo ficar atento quando estiver [employer] medicamento? Consulte seu mdico ou profissional de sade para acompanhamento regular Museum/gallery curator. Voc precisar fazer exames de sangue peridicos enquanto estiver [employer]. Voc pode precisar seguir uma dieta especial. Fale com seu mdico. Para conseguir o mximo benefcio deste medicamento, limite o seu consumo de lcool e evite fumar. Que efeitos colaterais posso sentir aps usar este medicamento? Efeitos colaterais que devem ser informados ao seu mdico ou profissional de sade o mais rpido possvel: reaes alrgicas, como erupo na pele, coceira, urticria, ou inchao do rosto, dos lbios ou da lngua pele azulada dor ou aperto no peito chiado no peito ou dificuldade para respirar tontura rea vermelha, inchada e dolorosa na perna Efeitos colaterais que normalmente no precisam de cuidados mdicos (avise ao seu mdico ou profissional de sade se persistirem ou forem incmodos): diarreia dor de cabea Esta lista pode no descrever todos os efeitos colaterais possveis. Para mais orientaes sobre efeitos colaterais, consulte o seu mdico. Voc pode relatar a ocorrncia de efeitos colaterais  FDA pelo telefone [phone]. Onde devo guardar meu medicamento? [facility] fora do [employer]. Conservar em [employer], entre 15 e 30 degreesC (  59 e 86 degreesF). Proteger Administrator, arts. Descartar qualquer medicamento no utilizado aps a data de validade impressa no rtulo ou embalagem. OBSERVAO: Este folheto  um resumo. Pode no cobrir todas as informaes possveis. Se tiver dvidas a respeito deste medicamento, fale com seu mdico, farmacutico ou profissional de sade.  2021 Elsevier/Gold Standard (2009-10-05 00:00:00)

## 2020-07-25 LAB — ANTINUCLEAR ANTIBODIES, IFA: ANA Ab, IFA: NEGATIVE

## 2020-07-26 ENCOUNTER — Telehealth: Payer: Self-pay | Admitting: Internal Medicine

## 2020-07-26 NOTE — Telephone Encounter (Signed)
R/s 727 app due to provider on call schedule. Called and left msg. Mailed printout

## 2020-07-27 ENCOUNTER — Other Ambulatory Visit: Payer: Self-pay

## 2020-07-27 ENCOUNTER — Inpatient Hospital Stay: Payer: Medicare Other

## 2020-07-27 DIAGNOSIS — H0102B Squamous blepharitis left eye, upper and lower eyelids: Secondary | ICD-10-CM | POA: Diagnosis not present

## 2020-07-27 DIAGNOSIS — H0102A Squamous blepharitis right eye, upper and lower eyelids: Secondary | ICD-10-CM | POA: Diagnosis not present

## 2020-07-27 DIAGNOSIS — H04123 Dry eye syndrome of bilateral lacrimal glands: Secondary | ICD-10-CM | POA: Diagnosis not present

## 2020-07-27 DIAGNOSIS — H43812 Vitreous degeneration, left eye: Secondary | ICD-10-CM | POA: Diagnosis not present

## 2020-07-27 DIAGNOSIS — E119 Type 2 diabetes mellitus without complications: Secondary | ICD-10-CM | POA: Diagnosis not present

## 2020-07-27 DIAGNOSIS — Z961 Presence of intraocular lens: Secondary | ICD-10-CM | POA: Diagnosis not present

## 2020-07-27 DIAGNOSIS — K921 Melena: Secondary | ICD-10-CM | POA: Diagnosis not present

## 2020-07-27 DIAGNOSIS — E538 Deficiency of other specified B group vitamins: Secondary | ICD-10-CM | POA: Diagnosis not present

## 2020-07-27 DIAGNOSIS — H401131 Primary open-angle glaucoma, bilateral, mild stage: Secondary | ICD-10-CM | POA: Diagnosis not present

## 2020-07-27 MED ORDER — CYANOCOBALAMIN 1000 MCG/ML IJ SOLN
1000.0000 ug | Freq: Once | INTRAMUSCULAR | Status: AC
Start: 2020-07-27 — End: 2020-07-27
  Administered 2020-07-27: 1000 ug via INTRAMUSCULAR

## 2020-07-27 MED ORDER — CYANOCOBALAMIN 1000 MCG/ML IJ SOLN
INTRAMUSCULAR | Status: AC
  Filled 2020-07-27: qty 1

## 2020-08-03 ENCOUNTER — Other Ambulatory Visit: Payer: Self-pay

## 2020-08-03 ENCOUNTER — Inpatient Hospital Stay: Payer: Medicare Other

## 2020-08-03 DIAGNOSIS — K921 Melena: Secondary | ICD-10-CM | POA: Diagnosis not present

## 2020-08-03 DIAGNOSIS — E538 Deficiency of other specified B group vitamins: Secondary | ICD-10-CM | POA: Diagnosis not present

## 2020-08-03 MED ORDER — CYANOCOBALAMIN 1000 MCG/ML IJ SOLN
1000.0000 ug | Freq: Once | INTRAMUSCULAR | Status: AC
  Administered 2020-08-03: 1000 ug via INTRAMUSCULAR

## 2020-08-03 MED ORDER — CYANOCOBALAMIN 1000 MCG/ML IJ SOLN
INTRAMUSCULAR | Status: AC
  Filled 2020-08-03: qty 1

## 2020-08-10 ENCOUNTER — Ambulatory Visit: Payer: Medicare Other | Admitting: Internal Medicine

## 2020-08-10 ENCOUNTER — Other Ambulatory Visit: Payer: Medicare Other

## 2020-08-10 ENCOUNTER — Ambulatory Visit: Payer: Medicare Other

## 2020-08-12 ENCOUNTER — Other Ambulatory Visit: Payer: Self-pay | Admitting: *Deleted

## 2020-08-12 DIAGNOSIS — D649 Anemia, unspecified: Secondary | ICD-10-CM

## 2020-08-12 DIAGNOSIS — D696 Thrombocytopenia, unspecified: Secondary | ICD-10-CM

## 2020-08-12 DIAGNOSIS — K921 Melena: Secondary | ICD-10-CM | POA: Diagnosis not present

## 2020-08-12 DIAGNOSIS — E538 Deficiency of other specified B group vitamins: Secondary | ICD-10-CM | POA: Diagnosis not present

## 2020-08-12 LAB — OCCULT BLOOD X 1 CARD TO LAB, STOOL
Fecal Occult Bld: NEGATIVE
Fecal Occult Bld: NEGATIVE
Fecal Occult Bld: POSITIVE — AB

## 2020-08-15 ENCOUNTER — Inpatient Hospital Stay: Payer: Medicare Other

## 2020-08-15 ENCOUNTER — Inpatient Hospital Stay (HOSPITAL_BASED_OUTPATIENT_CLINIC_OR_DEPARTMENT_OTHER): Payer: Medicare Other | Admitting: Internal Medicine

## 2020-08-15 ENCOUNTER — Inpatient Hospital Stay: Payer: Medicare Other | Attending: Physician Assistant

## 2020-08-15 ENCOUNTER — Other Ambulatory Visit: Payer: Self-pay

## 2020-08-15 VITALS — BP 118/74 | HR 65 | Temp 97.3°F | Resp 20 | Ht 69.0 in | Wt 167.4 lb

## 2020-08-15 DIAGNOSIS — D6959 Other secondary thrombocytopenia: Secondary | ICD-10-CM | POA: Diagnosis not present

## 2020-08-15 DIAGNOSIS — D649 Anemia, unspecified: Secondary | ICD-10-CM

## 2020-08-15 DIAGNOSIS — E538 Deficiency of other specified B group vitamins: Secondary | ICD-10-CM | POA: Diagnosis not present

## 2020-08-15 DIAGNOSIS — D529 Folate deficiency anemia, unspecified: Secondary | ICD-10-CM | POA: Diagnosis not present

## 2020-08-15 DIAGNOSIS — Z79899 Other long term (current) drug therapy: Secondary | ICD-10-CM | POA: Insufficient documentation

## 2020-08-15 DIAGNOSIS — D519 Vitamin B12 deficiency anemia, unspecified: Secondary | ICD-10-CM

## 2020-08-15 LAB — CMP (CANCER CENTER ONLY)
ALT: 19 U/L (ref 0–44)
AST: 19 U/L (ref 15–41)
Albumin: 3.9 g/dL (ref 3.5–5.0)
Alkaline Phosphatase: 52 U/L (ref 38–126)
Anion gap: 6 (ref 5–15)
BUN: 22 mg/dL (ref 8–23)
CO2: 28 mmol/L (ref 22–32)
Calcium: 9.5 mg/dL (ref 8.9–10.3)
Chloride: 109 mmol/L (ref 98–111)
Creatinine: 1.74 mg/dL — ABNORMAL HIGH (ref 0.61–1.24)
GFR, Estimated: 42 mL/min — ABNORMAL LOW (ref >60)
Glucose, Bld: 98 mg/dL (ref 70–99)
Potassium: 5.1 mmol/L (ref 3.5–5.1)
Sodium: 143 mmol/L (ref 135–145)
Total Bilirubin: 0.5 mg/dL (ref 0.3–1.2)
Total Protein: 7.1 g/dL (ref 6.5–8.1)

## 2020-08-15 LAB — CBC WITH DIFFERENTIAL (CANCER CENTER ONLY)
Abs Immature Granulocytes: 0.01 10*3/uL (ref 0.00–0.07)
Basophils Absolute: 0 10*3/uL (ref 0.0–0.1)
Basophils Relative: 1 %
Eosinophils Absolute: 0.3 10*3/uL (ref 0.0–0.5)
Eosinophils Relative: 4 %
HCT: 38.7 % — ABNORMAL LOW (ref 39.0–52.0)
Hemoglobin: 12.6 g/dL — ABNORMAL LOW (ref 13.0–17.0)
Immature Granulocytes: 0 %
Lymphocytes Relative: 25 %
Lymphs Abs: 2.1 10*3/uL (ref 0.7–4.0)
MCH: 28.7 pg (ref 26.0–34.0)
MCHC: 32.6 g/dL (ref 30.0–36.0)
MCV: 88.2 fL (ref 80.0–100.0)
Monocytes Absolute: 0.7 10*3/uL (ref 0.1–1.0)
Monocytes Relative: 8 %
Neutro Abs: 5.1 10*3/uL (ref 1.7–7.7)
Neutrophils Relative %: 62 %
Platelet Count: 133 10*3/uL — ABNORMAL LOW (ref 150–400)
RBC: 4.39 MIL/uL (ref 4.22–5.81)
RDW: 14.3 % (ref 11.5–15.5)
WBC Count: 8.1 10*3/uL (ref 4.0–10.5)
nRBC: 0 % (ref 0.0–0.2)

## 2020-08-15 MED ORDER — CYANOCOBALAMIN 1000 MCG/ML IJ SOLN
1000.0000 ug | INTRAMUSCULAR | Status: DC
  Administered 2020-08-15: 1000 ug via INTRAMUSCULAR

## 2020-08-15 MED ORDER — CYANOCOBALAMIN 1000 MCG/ML IJ SOLN
INTRAMUSCULAR | Status: AC
  Filled 2020-08-15: qty 1

## 2020-08-15 NOTE — Patient Instructions (Signed)

## 2020-08-15 NOTE — Progress Notes (Signed)
[facility] [phone]   [phone]  OFFICE PROGRESS NOTE  [name], FNP [address] [place] [address]  DIAGNOSIS: Anemia and thrombocytopenia secondary to vitamin B12 deficiency  PRIOR THERAPY: None   CURRENT THERAPY: Vitamin B12 injection weekly for 4 weeks, then monthly.  INTERVAL HISTORY: Angel Benson 70 y.o. male returns to the clinic today for follow-up visit.  The patient is feeling fine today with no concerning complaints except for mild fatigue.  He had extensive work-up for his anemia and thrombocytopenia recently including ANA, HIV, hepatitis panel, SPEP with immunofixation, LDH, rheumatoid factor, iron study and ferritin and there was no abnormality seen except for low vitamin B12 level.  The patient also has 1 positive stool card and 2 negative for Hemoccult.  He was started on vitamin B12 1000 mcg intramuscular weekly for 3 weeks.  He is here today for evaluation and repeat blood work and further recommendation regarding his condition.  He continues to have mild fatigue but no significant chest pain, shortness of breath, cough or hemoptysis.  He denied having any fever or chills.  He has no nausea, vomiting, diarrhea or constipation.  He has no headache or visual changes.  MEDICAL HISTORY: Past Medical History:  Diagnosis Date   Allergy    CAD, multiple vessel    s/p CABG   Chicken pox    Diabetes mellitus without complication (HCC)    Type 2   GERD (gastroesophageal reflux disease)    Hypercholesteremia    Hypertension    Urinary tract bacterial infections     ALLERGIES:  has No Known Allergies.  MEDICATIONS:  Current Outpatient Medications  Medication Sig Dispense Refill   aspirin 81 MG tablet Take 81 mg by mouth daily.     cholecalciferol (VITAMIN D3) 25 MCG (1000 UNIT) tablet Take 1,000 Units by mouth daily.     losartan (COZAAR) 50 MG tablet Take 1 tablet (50 mg total) by  mouth daily. 90 tablet 3   metoprolol succinate (TOPROL-XL) 50 MG 24 hr tablet TAKE 1 TABLET DAILY WITH OR IMMEDIATELY FOLLOWING A MEAL 90 tablet 3   Multiple Vitamin (MULTIVITAMIN) tablet Take 1 tablet by mouth daily.     Netarsudil-Latanoprost (ROCKLATAN) 0.02-0.005 % SOLN Apply to eye. Once daily     Omega-3 Fatty Acids (FISH OIL) 1000 MG CAPS Take by mouth.     rosuvastatin (CRESTOR) 40 MG tablet Take 1 tablet (40 mg total) by mouth daily. 90 tablet 3   SIMBRINZA 1-0.2 % SUSP 1 drop Both Eyes 2 times a day     timolol (TIMOPTIC) 0.5 % ophthalmic solution Use 1 drop in both eyes once a day     No current facility-administered medications for this visit.    SURGICAL HISTORY:  Past Surgical History:  Procedure Laterality Date   CARDIAC CATHETERIZATION     CORONARY ARTERY BYPASS GRAFT N/A 08/26/2013   Procedure: CORONARY ARTERY BYPASS GRAFTING (CABG) times five, using left internal mammary artery, right and left greater saphenous vein;  Surgeon: Grace Isaac, MD;  Location: [facility];  Service: Open Heart Surgery;  Laterality: N/A;  LIMA-LAD; SEQ SVG-OM1-OM2-OM3; SVG-RCA    INTRAOPERATIVE TRANSESOPHAGEAL ECHOCARDIOGRAM N/A 08/26/2013   Procedure: INTRAOPERATIVE TRANSESOPHAGEAL ECHOCARDIOGRAM;  Surgeon: Grace Isaac, MD;  Location: [facility];  Service: Open Heart Surgery;  Laterality: N/A;    REVIEW OF SYSTEMS:  A comprehensive review of systems was negative except for: Constitutional:  positive for fatigue   PHYSICAL EXAMINATION: General appearance: alert, cooperative, fatigued, and no distress Head: Normocephalic, without obvious abnormality, atraumatic Neck: no adenopathy, no JVD, supple, symmetrical, trachea midline, and thyroid not enlarged, symmetric, no tenderness/mass/nodules Lymph nodes: Cervical, supraclavicular, and axillary nodes normal. Resp: clear to auscultation bilaterally Back: symmetric, no curvature. ROM normal. No CVA tenderness. Cardio: regular rate and rhythm, S1,  S2 normal, no murmur, click, rub or gallop GI: soft, non-tender; bowel sounds normal; no masses,  no organomegaly Extremities: extremities normal, atraumatic, no cyanosis or edema  ECOG PERFORMANCE STATUS: 1 - Symptomatic but completely ambulatory  Blood pressure 118/74, pulse 65, temperature (!) 97.3 F (36.3 C), temperature source Tympanic, resp. rate 20, height '5\' 9"'$  (1.753 m), weight 167 lb 6.4 oz (75.9 kg), SpO2 100 %.  LABORATORY DATA: Lab Results  Component Value Date   WBC 8.1 08/15/2020   HGB 12.6 (L) 08/15/2020   HCT 38.7 (L) 08/15/2020   MCV 88.2 08/15/2020   PLT 133 (L) 08/15/2020      Chemistry      Component Value Date/Time   NA 143 08/15/2020 1337   K 5.1 08/15/2020 1337   CL 109 08/15/2020 1337   CO2 28 08/15/2020 1337   BUN 22 08/15/2020 1337   CREATININE 1.74 (H) 08/15/2020 1337   CREATININE 1.29 (H) 08/25/2019 1140      Component Value Date/Time   CALCIUM 9.5 08/15/2020 1337   ALKPHOS 52 08/15/2020 1337   AST 19 08/15/2020 1337   ALT 19 08/15/2020 1337   BILITOT 0.5 08/15/2020 1337       RADIOGRAPHIC STUDIES: No results found.  ASSESSMENT AND PLAN: This is a very pleasant 70 years old African-American male with anemia and thrombocytopenia likely secondary to vitamin B12 deficiency. The patient had extensive blood work for evaluation of his condition and these were unremarkable except for the vitamin B12 deficiency. He was started on treatment with vitamin B12 1000 mcg intramuscular weekly for 3 weeks and starting from today he will be on monthly treatment. Regarding the Hemoccult positive stool card out of 3, I am not sure if the patient will need to see gastroenterology for this problem but he is followed by Dr. Loletha Carrow. I will see the patient back for follow-up visit in 3 months with repeat blood work. He was advised to call immediately if he has any other concerning symptoms in the interval. The patient voices understanding of current disease  status and treatment options and is in agreement with the current care plan.  All questions were answered. The patient knows to call the clinic with any problems, questions or concerns. We can certainly see the patient much sooner if necessary.  The total time spent in the appointment was 20 minutes.  Disclaimer: This note was dictated with voice recognition software. Similar sounding words can inadvertently be transcribed and may not be corrected upon review.

## 2020-08-18 ENCOUNTER — Telehealth: Payer: Self-pay | Admitting: Internal Medicine

## 2020-08-18 NOTE — Telephone Encounter (Signed)
Scheduled per los. Called and left msg. Mailed printout  °

## 2020-08-29 ENCOUNTER — Ambulatory Visit (INDEPENDENT_AMBULATORY_CARE_PROVIDER_SITE_OTHER): Payer: Medicare Other | Admitting: Internal Medicine

## 2020-08-29 ENCOUNTER — Other Ambulatory Visit: Payer: Self-pay

## 2020-08-29 ENCOUNTER — Encounter: Payer: Self-pay | Admitting: Internal Medicine

## 2020-08-29 VITALS — BP 133/72 | HR 59 | Ht 69.0 in | Wt 169.4 lb

## 2020-08-29 DIAGNOSIS — E119 Type 2 diabetes mellitus without complications: Secondary | ICD-10-CM | POA: Diagnosis not present

## 2020-08-29 DIAGNOSIS — E782 Mixed hyperlipidemia: Secondary | ICD-10-CM

## 2020-08-29 DIAGNOSIS — I2510 Atherosclerotic heart disease of native coronary artery without angina pectoris: Secondary | ICD-10-CM

## 2020-08-29 DIAGNOSIS — Z951 Presence of aortocoronary bypass graft: Secondary | ICD-10-CM | POA: Diagnosis not present

## 2020-08-29 NOTE — Progress Notes (Signed)
08/29/2020   PCP: [name], FNP  CC: No complaints  Primary Cardiologist:Dr. Alma Friendly  HPI: 70 year old AA male with no prior cardiac hx was admitted to [facility] 08/24/13 with chest pain, + NSTEMI.  Cardiac cath with severe triple vessel CAD with total occlusion of the distal RCA, severe proximal LAD stenosis involving a long segment, occluded distal LAD, high grade disease in all three obtuse marginal branches and the mid Circumflex.  Mild segmental LV systolic dysfunction.  Angel Benson then underwent PROCEDURE: Procedure(s):   CORONARY ARTERY BYPASS GRAFTING (CABG)X5 LIMA-LAD; SEQ SVG-OM1-OM2-OM3; SVG-RCA  INTRAOPERATIVE TRANSESOPHAGEAL ECHOCARDIOGRAM EVH RIGHT THIGH OVH RIGHT LOWER AND LEFT LOWER LEG by Dr. Servando Snare 08/26/13.  Pt did well post op, no atrial fib and progressed with out complications.  Since discharge Angel Benson has done well no complaint today.  Walking 20 min a day- a mile or so.  occ surgical chest pain.  Eating healthy.    Angel Benson has no complaints today. Angel Benson follows up and is doing well. Angel Benson started cardiac rehabilitation and is doing well with his exercise. Denies any chest pain. Angel Benson was supposed to be on lisinopril however the prescription was never placed. We did go ahead and start low-dose lisinopril and Angel Benson is taking it without complications.   Angel Benson returns today for follow-up. Overall Angel Benson is doing very well. Angel Benson denies any chest pain or worsening shortness of breath. It was noted that during his catheterization EF is reduced  To 45-50%. This is not been reassessed postoperatively. Angel Benson is also not had a check of his cholesterol in some time.  10/20/2015  Angel Benson returns today for follow-up. Angel Benson reports doing well and denies any chest pain or worsening shortness of breath.Angel Benson saw his primary care provider in August of this year and had blood work including a lipid panel For which she is total cholesterol was 217 however LDL remained very high at 154. This is on low-dose  simvastatin. Based on prescribing regulations by the FDA, we cannot further increase his dose of simvastatin. In fact Angel Benson needs to be on a high-dose statin and I would recommend switching to Crestor 40 mg daily. After his last office visit we repeated his echocardiogram which showed normalization of LV function. This indicates successful revascularization. Blood pressure is well-controlled today.  05/24/2017  Angel Benson was seen today in follow-up.  Is been a couple years since we last saw him.  Overall Angel Benson is feeling well, but Angel Benson freely admits that Angel Benson has been off of his medicines for several months.  Unfortunately his sister died recently of cancer.  Angel Benson saw his primary care provider who restarted his medicines and Angel Benson started taking them.  Blood pressure is well controlled today.  His cholesterol profile, not surprisingly is abnormal, being reflective of being off of his Crestor.  Total cholesterol 232, LDL of 167.  Despite this Angel Benson denied any chest pain or worsening shortness of breath.  08/29/2020  Angel Benson returns today for follow-up.  I last saw him back in 2019, and at this point Angel Benson is considered a new patient.  Overall Angel Benson is doing well.  Angel Benson does not have any new complaints such as chest pain or worsening shortness of breath.  Angel Benson had recent labs including a lipid profile in June which demonstrated total cholesterol of 100 109, HDL 41, triglycerides 86 and LDL 51.  Blood pressure is also well controlled today 133/72.  EKG shows a sinus bradycardia without any ischemic  changes.  Angel Benson says Angel Benson is very physically active.  Angel Benson has not had COVID-19 to his knowledge but has had his vaccinations.   No Known Allergies  Current Outpatient Medications  Medication Sig Dispense Refill   aspirin 81 MG tablet Take 81 mg by mouth daily.     cholecalciferol (VITAMIN D3) 25 MCG (1000 UNIT) tablet Take 1,000 Units by mouth daily.     losartan (COZAAR) 50 MG tablet Take 1 tablet (50 mg total) by mouth daily. 90 tablet  3   metoprolol succinate (TOPROL-XL) 50 MG 24 hr tablet TAKE 1 TABLET DAILY WITH OR IMMEDIATELY FOLLOWING A MEAL 90 tablet 3   Multiple Vitamin (MULTIVITAMIN) tablet Take 1 tablet by mouth daily.     Netarsudil-Latanoprost (ROCKLATAN) 0.02-0.005 % SOLN Apply to eye. Once daily     Omega-3 Fatty Acids (FISH OIL) 1000 MG CAPS Take by mouth.     rosuvastatin (CRESTOR) 40 MG tablet Take 1 tablet (40 mg total) by mouth daily. 90 tablet 3   SIMBRINZA 1-0.2 % SUSP 1 drop Both Eyes 2 times a day     timolol (TIMOPTIC) 0.5 % ophthalmic solution Use 1 drop in both eyes once a day     No current facility-administered medications for this visit.    Past Medical History:  Diagnosis Date   Allergy    CAD, multiple vessel    s/p CABG   Chicken pox    Diabetes mellitus without complication (HCC)    Type 2   GERD (gastroesophageal reflux disease)    Hypercholesteremia    Hypertension    Urinary tract bacterial infections     Past Surgical History:  Procedure Laterality Date   CARDIAC CATHETERIZATION     CORONARY ARTERY BYPASS GRAFT N/A 08/26/2013   Procedure: CORONARY ARTERY BYPASS GRAFTING (CABG) times five, using left internal mammary artery, right and left greater saphenous vein;  Surgeon: Grace Isaac, MD;  Location: [facility];  Service: Open Heart Surgery;  Laterality: N/A;  LIMA-LAD; SEQ SVG-OM1-OM2-OM3; SVG-RCA    INTRAOPERATIVE TRANSESOPHAGEAL ECHOCARDIOGRAM N/A 08/26/2013   Procedure: INTRAOPERATIVE TRANSESOPHAGEAL ECHOCARDIOGRAM;  Surgeon: Grace Isaac, MD;  Location: [facility];  Service: Open Heart Surgery;  Laterality: N/A;    ROS: Pertinent items noted in HPI and remainder of comprehensive ROS otherwise negative.   Wt Readings from Last 3 Encounters:  08/29/20 169 lb 6.4 oz (76.8 kg)  08/15/20 167 lb 6.4 oz (75.9 kg)  07/19/20 163 lb 9.6 oz (74.2 kg)    PHYSICAL EXAM BP 133/72   Pulse (!) 59   Ht '5\' 9"'$  (1.753 m)   Wt 169 lb 6.4 oz (76.8 kg)   SpO2 97%   BMI 25.02 kg/m   General appearance: alert and no distress Neck: no carotid bruit, no JVD and thyroid not enlarged, symmetric, no tenderness/mass/nodules Lungs: clear to auscultation bilaterally Heart: regular rate and rhythm, S1, S2 normal, no murmur, click, rub or gallop Abdomen: soft, non-tender; bowel sounds normal; no masses,  no organomegaly Extremities: extremities normal, atraumatic, no cyanosis or edema Pulses: 2+ and symmetric Skin: Skin color, texture, turgor normal. No rashes or lesions Neurologic: Grossly normal Psych: Pleasant  EKG: Sinus bradycardia 59-personally reviewed  ASSESSMENT AND PLAN Patient Active Problem List   Diagnosis Date Noted   B12 deficiency 07/14/2020   Anemia 07/14/2020   Thrombocytopenia ([place]) 07/14/2020   Cardiomyopathy, ischemic 09/27/2014   Routine general medical examination at a health care facility 02/01/2014   S/P CABG x 5 08/26/2013  Hyperlipidemia 08/25/2013   Coronary artery disease involving native coronary artery of native heart without angina pectoris 08/25/2013   Benign essential HTN 08/25/2013   Type 2 diabetes mellitus with complication ([place]) [id number]   NSTEMI (non-ST elevated myocardial infarction) ([place]) 08/24/2013   PLAN: 1.  Angel Benson seems to do well and is asymptomatic after CABG in 2015.  Angel Benson denies any chest pain or worsening shortness of breath.  His lipids are well controlled.  Blood pressure is at goal.  A1c was 7.1 overall Angel Benson is doing very well.  Angel Benson has a B12 deficiency and anemia which is being followed by the hematologist.  No medication changes today.  Follow-up annually or sooner as necessary.  Pixie Casino, MD, [facility], [place] Director of the [facility] Lipid Disorders &  Cardiovascular Risk Reduction Clinic Diplomate of the American Board of Clinical Lipidology  Attending Cardiologist  Direct Dial: [phone]  Fax: [phone]  Website:  [email or web address]

## 2020-08-29 NOTE — Patient Instructions (Signed)

## 2020-08-31 ENCOUNTER — Other Ambulatory Visit: Payer: Self-pay

## 2020-08-31 ENCOUNTER — Inpatient Hospital Stay: Payer: Medicare Other

## 2020-08-31 NOTE — Progress Notes (Unsigned)
Per MD Mohamed on 08/15/2020, "He was started on treatment with vitamin B12 1000 mcg intramuscular weekly for 3 weeks and starting from today he will be on monthly treatment." Therefore, appt for today (08/31/2020) is not needed per Ansyi CMA also. Pt will be seen in September for next inj.

## 2020-09-01 ENCOUNTER — Other Ambulatory Visit: Payer: Self-pay | Admitting: Urology

## 2020-09-01 ENCOUNTER — Encounter: Payer: Self-pay | Admitting: Physician Assistant

## 2020-09-01 DIAGNOSIS — C61 Malignant neoplasm of prostate: Secondary | ICD-10-CM

## 2020-09-05 ENCOUNTER — Other Ambulatory Visit: Payer: Self-pay

## 2020-09-05 ENCOUNTER — Ambulatory Visit (AMBULATORY_SURGERY_CENTER): Payer: Self-pay

## 2020-09-05 VITALS — Ht 69.0 in | Wt 169.0 lb

## 2020-09-05 DIAGNOSIS — R195 Other fecal abnormalities: Secondary | ICD-10-CM

## 2020-09-05 MED ORDER — PLENVU 140 G PO SOLR: 1.0000 | ORAL | 0 refills | Status: DC

## 2020-09-05 NOTE — Progress Notes (Signed)
No egg or soy allergy known to patient  No issues with past sedation with any surgeries or procedures Patient denies ever being told they had issues or difficulty with intubation  No FH of Malignant Hyperthermia No diet pills per patient No home 02 use per patient  No blood thinners per patient  Pt denies issues with constipation at this time; No A fib or A flutter  COVID 19 guidelines implemented in PV today with Pt and RN   Pt is fully vaccinated for Covid x 2;   Coupon given to pt in PV today and NO PA's for preps discussed with pt in PV today  Discussed with pt there will be an out-of-pocket cost for prep and that varies from $0 to 70 +  dollars   Due to the COVID-19 pandemic we are asking patients to follow certain guidelines.  Pt aware of COVID protocols and [facility] guidelines

## 2020-09-07 ENCOUNTER — Encounter: Payer: Self-pay | Admitting: Gastroenterology

## 2020-09-07 DIAGNOSIS — Z961 Presence of intraocular lens: Secondary | ICD-10-CM | POA: Diagnosis not present

## 2020-09-07 DIAGNOSIS — H0102A Squamous blepharitis right eye, upper and lower eyelids: Secondary | ICD-10-CM | POA: Diagnosis not present

## 2020-09-07 DIAGNOSIS — H401131 Primary open-angle glaucoma, bilateral, mild stage: Secondary | ICD-10-CM | POA: Diagnosis not present

## 2020-09-07 DIAGNOSIS — H43812 Vitreous degeneration, left eye: Secondary | ICD-10-CM | POA: Diagnosis not present

## 2020-09-07 DIAGNOSIS — E119 Type 2 diabetes mellitus without complications: Secondary | ICD-10-CM | POA: Diagnosis not present

## 2020-09-07 DIAGNOSIS — H0102B Squamous blepharitis left eye, upper and lower eyelids: Secondary | ICD-10-CM | POA: Diagnosis not present

## 2020-09-07 DIAGNOSIS — H04123 Dry eye syndrome of bilateral lacrimal glands: Secondary | ICD-10-CM | POA: Diagnosis not present

## 2020-09-09 ENCOUNTER — Encounter: Payer: Self-pay | Admitting: Gastroenterology

## 2020-09-09 ENCOUNTER — Other Ambulatory Visit: Payer: Self-pay

## 2020-09-09 ENCOUNTER — Ambulatory Visit (AMBULATORY_SURGERY_CENTER): Payer: Medicare Other | Admitting: Gastroenterology

## 2020-09-09 VITALS — BP 132/63 | HR 76 | Temp 99.0°F | Resp 13 | Ht 69.0 in | Wt 169.0 lb

## 2020-09-09 DIAGNOSIS — K5730 Diverticulosis of large intestine without perforation or abscess without bleeding: Secondary | ICD-10-CM | POA: Diagnosis not present

## 2020-09-09 DIAGNOSIS — R195 Other fecal abnormalities: Secondary | ICD-10-CM | POA: Diagnosis not present

## 2020-09-09 MED ORDER — SODIUM CHLORIDE 0.9 % IV SOLN: 500.0000 mL | Freq: Once | INTRAVENOUS | Status: DC

## 2020-09-09 NOTE — Op Note (Signed)
[facility] Patient Name: Angel Benson Procedure Date: 09/09/2020 2:03 PM MRN: [id number] Endoscopist: Mallie Mussel L. Loletha Carrow , MD Age: 70 Referring MD:  Date of Birth: [birth year] Gender: Male Account #: [id number] Procedure:                Colonoscopy Indications:              Heme positive stool                           test done for normocytic anemia with normal iron                            levels in setting of CKD                           1/3 stool cards positive                           diminutive TA polyp 08/2017                           no localizing GI symptoms Medicines:                Monitored Anesthesia Care Procedure:                Pre-Anesthesia Assessment:                           - Prior to the procedure, a History and Physical                            was performed, and patient medications and                            allergies were reviewed. The patient's tolerance of                            previous anesthesia was also reviewed. The risks                            and benefits of the procedure and the sedation                            options and risks were discussed with the patient.                            All questions were answered, and informed consent                            was obtained. Prior Anticoagulants: The patient has                            taken no previous anticoagulant or antiplatelet                            agents except for aspirin. ASA  Grade Assessment:                            III - A patient with severe systemic disease. After                            reviewing the risks and benefits, the patient was                            deemed in satisfactory condition to undergo the                            procedure.                           After obtaining informed consent, the colonoscope                            was passed under direct vision. Throughout the                            procedure, the  patient's blood pressure, pulse, and                            oxygen saturations were monitored continuously. The                            Olympus CF-HQ190L [id number]) Colonoscope was                            introduced through the anus and advanced to the the                            cecum, identified by appendiceal orifice and                            ileocecal valve. The colonoscopy was performed                            without difficulty. The patient tolerated the                            procedure well. The quality of the bowel                            preparation was excellent. The ileocecal valve,                            appendiceal orifice, and rectum were photographed. Scope In: 2:26:53 PM Scope Out: [id number] PM Scope Withdrawal Time: 0 hours 7 minutes 46 seconds  Total Procedure Duration: 0 hours 9 minutes 49 seconds  Findings:                 The perianal and digital rectal examinations were  normal.                           A few small-mouthed diverticula were found in the                            left colon.                           The exam was otherwise without abnormality on                            direct and retroflexion views. Complications:            No immediate complications. Estimated Blood Loss:     Estimated blood loss: none. Impression:               - Diverticulosis in the left colon.                           - The examination was otherwise normal on direct                            and retroflexion views.                           - No specimens collected.                           Anemia appears likely to be from CKD rather than GI                            blood loss. Suspected false positive FOBT. Recommendation:           - Patient has a contact number available for                            emergencies. The signs and symptoms of potential                            delayed complications were  discussed with the                            patient. Return to normal activities tomorrow.                            Written discharge instructions were provided to the                            patient.                           - Resume previous diet.                           - Continue present medications.                           -  No repeat colonoscopy due to age, current                            guidelines and the absence of colonic polyps. Shameek Nyquist L. Loletha Carrow, MD 09/09/2020 2:43:32 PM This report has been signed electronically.

## 2020-09-09 NOTE — Progress Notes (Signed)
Pt's states no medical or surgical changes since previsit or office visit. 

## 2020-09-09 NOTE — Patient Instructions (Signed)
Read the discharge instructions given to you by your recovery room nurse.  YOU HAD AN ENDOSCOPIC PROCEDURE TODAY AT [facility]:   Refer to the procedure report that was given to you for any specific questions about what was found during the examination.  If the procedure report does not answer your questions, please call your gastroenterologist to clarify.  If you requested that your care partner not be given the details of your procedure findings, then the procedure report has been included in a sealed envelope for you to review at your convenience later.  YOU SHOULD EXPECT: Some feelings of bloating in the abdomen. Passage of more gas than usual.  Walking can help get rid of the air that was put into your GI tract during the procedure and reduce the bloating. If you had a lower endoscopy (such as a colonoscopy or flexible sigmoidoscopy) you may notice spotting of blood in your stool or on the toilet paper. If you underwent a bowel prep for your procedure, you may not have a normal bowel movement for a few days.  Please Note:  You might notice some irritation and congestion in your nose or some drainage.  This is from the oxygen used during your procedure.  There is no need for concern and it should clear up in a day or so.  SYMPTOMS TO REPORT IMMEDIATELY:  Following lower endoscopy (colonoscopy or flexible sigmoidoscopy):  Excessive amounts of blood in the stool  Significant tenderness or worsening of abdominal pains  Swelling of the abdomen that is new, acute  Fever of 100F or higher   For urgent or emergent issues, a gastroenterologist can be reached at any hour by calling [phone]. Do not use MyChart messaging for urgent concerns.    DIET:  We do recommend a small meal at first, but then you may proceed to your regular diet.  Drink plenty of fluids but you should avoid alcoholic beverages for 24 hours.  ACTIVITY:  You should plan to take it easy for the rest of  today and you should NOT DRIVE or use heavy machinery until tomorrow (because of the sedation medicines used during the test).    FOLLOW UP: Our staff will call the number listed on your records 48-72 hours following your procedure to check on you and address any questions or concerns that you may have regarding the information given to you following your procedure. If we do not reach you, we will leave a message.  We will attempt to reach you two times.  During this call, we will ask if you have developed any symptoms of COVID 19. If you develop any symptoms (ie: fever, flu-like symptoms, shortness of breath, cough etc.) before then, please call [phone].  If you test positive for Covid 19 in the 2 weeks post procedure, please call and report this information to [place].      SIGNATURES/CONFIDENTIALITY: You and/or your care partner have signed paperwork which will be entered into your electronic medical record.  These signatures attest to the fact that that the information above on your After Visit Summary has been reviewed and is understood.  Full responsibility of the confidentiality of this discharge information lies with you and/or your care-partner.

## 2020-09-09 NOTE — Progress Notes (Signed)
Sedate, gd SR's, VSS, report to RN 

## 2020-09-09 NOTE — Progress Notes (Signed)
History and Physical:  This patient presents for endoscopic testing for: Encounter Diagnosis  Name Primary?   Heme positive stool Yes   1/3 recent stool cards positive. Diminutive TA polyp 08/2017 CKD and normocytic anemia with normal iron levels.   Past Medical History: Past Medical History:  Diagnosis Date   Anemia    CAD, multiple vessel    s/p CABG   Cataract    bilateral cataract removed   Chicken pox    Diabetes mellitus without complication ([place])    Type 2   GERD (gastroesophageal reflux disease)    Glaucoma    Hypercholesteremia    Hypertension    Myocardial infarction ([place]) 2015   then CABG   Prostate CA ([place]) 2022   "watching it" - no sx planned at this time (09/05/2020)   Tuberculosis 1993   received tx post exposure to person with TB   Urinary tract bacterial infections    Vitamin B12 deficiency      Past Surgical History: Past Surgical History:  Procedure Laterality Date   CARDIAC CATHETERIZATION     CATARACT EXTRACTION, BILATERAL     CORONARY ARTERY BYPASS GRAFT N/A 08/26/2013   Procedure: CORONARY ARTERY BYPASS GRAFTING (CABG) times five, using left internal mammary artery, right and left greater saphenous vein;  Surgeon: Grace Isaac, MD;  Location: [facility];  Service: Open Heart Surgery;  Laterality: N/A;  LIMA-LAD; SEQ SVG-OM1-OM2-OM3; SVG-RCA    INTRAOPERATIVE TRANSESOPHAGEAL ECHOCARDIOGRAM N/A 08/26/2013   Procedure: INTRAOPERATIVE TRANSESOPHAGEAL ECHOCARDIOGRAM;  Surgeon: Grace Isaac, MD;  Location: [facility];  Service: Open Heart Surgery;  Laterality: N/A;    Allergies: No Known Allergies  Outpatient Meds: Current Outpatient Medications  Medication Sig Dispense Refill   aspirin 81 MG tablet Take 81 mg by mouth daily.     cholecalciferol (VITAMIN D3) 25 MCG (1000 UNIT) tablet Take 1,000 Units by mouth daily.     losartan (COZAAR) 50 MG tablet Take 1 tablet (50 mg total) by mouth daily. 90 tablet 3   metoprolol succinate (TOPROL-XL) 50  MG 24 hr tablet TAKE 1 TABLET DAILY WITH OR IMMEDIATELY FOLLOWING A MEAL 90 tablet 3   Multiple Vitamin (MULTIVITAMIN) tablet Take 1 tablet by mouth daily.     Netarsudil-Latanoprost (ROCKLATAN) 0.02-0.005 % SOLN Apply to eye. Once daily     Omega-3 Fatty Acids (FISH OIL) 1000 MG CAPS Take 1 capsule by mouth at bedtime.     rosuvastatin (CRESTOR) 40 MG tablet Take 1 tablet (40 mg total) by mouth daily. 90 tablet 3   SIMBRINZA 1-0.2 % SUSP 1 drop Both Eyes 2 times a day     timolol (TIMOPTIC) 0.5 % ophthalmic solution Use 1 drop in both eyes once a day     Current Facility-Administered Medications  Medication Dose Route Frequency Provider Last Rate Last Admin   0.9 %  sodium chloride infusion  500 mL Intravenous Once Danis, Kirke Corin, MD          ___________________________________________________________________ Objective   Exam:  BP (!) 150/79   Pulse 77   Temp 99 F (37.2 C) (Temporal)   Ht '5\' 9"'$  (1.753 m)   Wt 169 lb (76.7 kg)   SpO2 100%   BMI 24.96 kg/m   CV: RRR without murmur, S1/S2 Resp: clear to auscultation bilaterally, normal RR and effort noted GI: soft, no tenderness, with active bowel sounds.   CBC Latest Ref Rng & Units 08/15/2020 07/14/2020 07/01/2020  WBC 4.0 - 10.5 K/uL 8.1  6.4 6.1  Hemoglobin 13.0 - 17.0 g/dL 12.6(L) 11.4(L) 11.1(L)  Hematocrit 39.0 - 52.0 % 38.7(L) 36.1(L) 34.4(L)  Platelets 150 - 400 K/uL 133(L) 136(L) 114.0(L)     Assessment: Encounter Diagnosis  Name Primary?   Heme positive stool Yes     Plan: Colonoscopy   The patient is appropriate for an endoscopic procedure in the ambulatory setting.   - Wilfrid Lund, MD

## 2020-09-13 ENCOUNTER — Telehealth: Payer: Self-pay | Admitting: *Deleted

## 2020-09-13 NOTE — Telephone Encounter (Signed)
Message left

## 2020-09-28 ENCOUNTER — Other Ambulatory Visit: Payer: Self-pay

## 2020-09-28 ENCOUNTER — Inpatient Hospital Stay: Payer: Medicare Other | Attending: Physician Assistant

## 2020-09-28 DIAGNOSIS — D529 Folate deficiency anemia, unspecified: Secondary | ICD-10-CM | POA: Insufficient documentation

## 2020-09-28 DIAGNOSIS — E538 Deficiency of other specified B group vitamins: Secondary | ICD-10-CM

## 2020-09-28 MED ORDER — CYANOCOBALAMIN 1000 MCG/ML IJ SOLN
1000.0000 ug | INTRAMUSCULAR | Status: DC
  Administered 2020-09-28: 1000 ug via INTRAMUSCULAR
  Filled 2020-09-28: qty 1

## 2020-10-04 ENCOUNTER — Other Ambulatory Visit: Payer: Self-pay

## 2020-10-04 ENCOUNTER — Ambulatory Visit
Admission: RE | Admit: 2020-10-04 | Discharge: 2020-10-04 | Disposition: A | Discharge status: Home / Self Care | Payer: Medicare Other | Source: Ambulatory Visit | Attending: Urology | Admitting: Urology

## 2020-10-04 DIAGNOSIS — C61 Malignant neoplasm of prostate: Secondary | ICD-10-CM

## 2020-10-04 MED ORDER — GADOBENATE DIMEGLUMINE 529 MG/ML IV SOLN
15.0000 mL | Freq: Once | INTRAVENOUS | Status: AC | PRN
  Administered 2020-10-04: 15 mL via INTRAVENOUS

## 2020-10-26 ENCOUNTER — Other Ambulatory Visit: Payer: Self-pay

## 2020-10-26 ENCOUNTER — Inpatient Hospital Stay: Payer: Medicare Other | Attending: Physician Assistant

## 2020-10-26 DIAGNOSIS — E538 Deficiency of other specified B group vitamins: Secondary | ICD-10-CM

## 2020-10-26 MED ORDER — CYANOCOBALAMIN 1000 MCG/ML IJ SOLN
1000.0000 ug | INTRAMUSCULAR | Status: DC
  Administered 2020-10-26: 1000 ug via INTRAMUSCULAR
  Filled 2020-10-26: qty 1

## 2020-11-24 ENCOUNTER — Other Ambulatory Visit: Payer: Self-pay

## 2020-11-24 ENCOUNTER — Encounter (HOSPITAL_COMMUNITY): Payer: Self-pay

## 2020-11-24 ENCOUNTER — Emergency Department (HOSPITAL_COMMUNITY): Payer: Medicare Other

## 2020-11-24 ENCOUNTER — Observation Stay (HOSPITAL_COMMUNITY)
Admission: EM | Admit: 2020-11-24 | Discharge: 2020-11-26 | Disposition: A | Discharge status: Home / Self Care | Payer: Medicare Other | Attending: Internal Medicine | Admitting: Internal Medicine

## 2020-11-24 DIAGNOSIS — Z951 Presence of aortocoronary bypass graft: Secondary | ICD-10-CM | POA: Diagnosis not present

## 2020-11-24 DIAGNOSIS — Z20822 Contact with and (suspected) exposure to covid-19: Secondary | ICD-10-CM | POA: Insufficient documentation

## 2020-11-24 DIAGNOSIS — Y900 Blood alcohol level of less than 20 mg/100 ml: Secondary | ICD-10-CM | POA: Diagnosis not present

## 2020-11-24 DIAGNOSIS — R748 Abnormal levels of other serum enzymes: Secondary | ICD-10-CM | POA: Diagnosis present

## 2020-11-24 DIAGNOSIS — I10 Essential (primary) hypertension: Secondary | ICD-10-CM | POA: Diagnosis not present

## 2020-11-24 DIAGNOSIS — N1832 Chronic kidney disease, stage 3b: Secondary | ICD-10-CM | POA: Insufficient documentation

## 2020-11-24 DIAGNOSIS — N183 Chronic kidney disease, stage 3 unspecified: Secondary | ICD-10-CM | POA: Diagnosis present

## 2020-11-24 DIAGNOSIS — R319 Hematuria, unspecified: Secondary | ICD-10-CM

## 2020-11-24 DIAGNOSIS — N1831 Chronic kidney disease, stage 3a: Secondary | ICD-10-CM

## 2020-11-24 DIAGNOSIS — R710 Precipitous drop in hematocrit: Secondary | ICD-10-CM

## 2020-11-24 DIAGNOSIS — Z79899 Other long term (current) drug therapy: Secondary | ICD-10-CM | POA: Insufficient documentation

## 2020-11-24 DIAGNOSIS — Z7982 Long term (current) use of aspirin: Secondary | ICD-10-CM | POA: Diagnosis not present

## 2020-11-24 DIAGNOSIS — Z8546 Personal history of malignant neoplasm of prostate: Secondary | ICD-10-CM | POA: Insufficient documentation

## 2020-11-24 DIAGNOSIS — K921 Melena: Secondary | ICD-10-CM

## 2020-11-24 DIAGNOSIS — E1122 Type 2 diabetes mellitus with diabetic chronic kidney disease: Secondary | ICD-10-CM | POA: Insufficient documentation

## 2020-11-24 DIAGNOSIS — I2510 Atherosclerotic heart disease of native coronary artery without angina pectoris: Secondary | ICD-10-CM | POA: Diagnosis not present

## 2020-11-24 DIAGNOSIS — K449 Diaphragmatic hernia without obstruction or gangrene: Secondary | ICD-10-CM | POA: Diagnosis not present

## 2020-11-24 DIAGNOSIS — N200 Calculus of kidney: Secondary | ICD-10-CM | POA: Diagnosis not present

## 2020-11-24 DIAGNOSIS — R569 Unspecified convulsions: Principal | ICD-10-CM

## 2020-11-24 DIAGNOSIS — D696 Thrombocytopenia, unspecified: Secondary | ICD-10-CM | POA: Diagnosis not present

## 2020-11-24 DIAGNOSIS — E785 Hyperlipidemia, unspecified: Secondary | ICD-10-CM | POA: Diagnosis present

## 2020-11-24 DIAGNOSIS — E1159 Type 2 diabetes mellitus with other circulatory complications: Secondary | ICD-10-CM | POA: Diagnosis present

## 2020-11-24 DIAGNOSIS — E1169 Type 2 diabetes mellitus with other specified complication: Secondary | ICD-10-CM | POA: Diagnosis present

## 2020-11-24 DIAGNOSIS — I129 Hypertensive chronic kidney disease with stage 1 through stage 4 chronic kidney disease, or unspecified chronic kidney disease: Secondary | ICD-10-CM | POA: Diagnosis not present

## 2020-11-24 DIAGNOSIS — D72829 Elevated white blood cell count, unspecified: Secondary | ICD-10-CM | POA: Diagnosis present

## 2020-11-24 DIAGNOSIS — I152 Hypertension secondary to endocrine disorders: Secondary | ICD-10-CM | POA: Diagnosis present

## 2020-11-24 DIAGNOSIS — E118 Type 2 diabetes mellitus with unspecified complications: Secondary | ICD-10-CM | POA: Diagnosis present

## 2020-11-24 LAB — URINALYSIS, ROUTINE W REFLEX MICROSCOPIC
Bacteria, UA: NONE SEEN
Bilirubin Urine: NEGATIVE
Glucose, UA: 50 mg/dL — AB
Ketones, ur: 5 mg/dL — AB
Leukocytes,Ua: NEGATIVE
Nitrite: NEGATIVE
Protein, ur: 100 mg/dL — AB
RBC / HPF: >50 RBC/hpf — ABNORMAL HIGH (ref 0–5)
Specific Gravity, Urine: >1.046 — ABNORMAL HIGH (ref 1.005–1.030)
pH: 5 (ref 5.0–8.0)

## 2020-11-24 LAB — BASIC METABOLIC PANEL
Anion gap: 10 (ref 5–15)
BUN: 24 mg/dL — ABNORMAL HIGH (ref 8–23)
CO2: 24 mmol/L (ref 22–32)
Calcium: 8.6 mg/dL — ABNORMAL LOW (ref 8.9–10.3)
Chloride: 104 mmol/L (ref 98–111)
Creatinine, Ser: 1.86 mg/dL — ABNORMAL HIGH (ref 0.61–1.24)
GFR, Estimated: 39 mL/min — ABNORMAL LOW (ref >60)
Glucose, Bld: 245 mg/dL — ABNORMAL HIGH (ref 70–99)
Potassium: 3.8 mmol/L (ref 3.5–5.1)
Sodium: 138 mmol/L (ref 135–145)

## 2020-11-24 LAB — CBC WITH DIFFERENTIAL/PLATELET
Abs Immature Granulocytes: 0.05 10*3/uL (ref 0.00–0.07)
Basophils Absolute: 0.1 10*3/uL (ref 0.0–0.1)
Basophils Relative: 1 %
Eosinophils Absolute: 0.2 10*3/uL (ref 0.0–0.5)
Eosinophils Relative: 1 %
HCT: 40 % (ref 39.0–52.0)
Hemoglobin: 12.8 g/dL — ABNORMAL LOW (ref 13.0–17.0)
Immature Granulocytes: 0 %
Lymphocytes Relative: 16 %
Lymphs Abs: 2.2 10*3/uL (ref 0.7–4.0)
MCH: 28.2 pg (ref 26.0–34.0)
MCHC: 32 g/dL (ref 30.0–36.0)
MCV: 88.1 fL (ref 80.0–100.0)
Monocytes Absolute: 1.1 10*3/uL — ABNORMAL HIGH (ref 0.1–1.0)
Monocytes Relative: 8 %
Neutro Abs: 10.5 10*3/uL — ABNORMAL HIGH (ref 1.7–7.7)
Neutrophils Relative %: 74 %
Platelets: 158 10*3/uL (ref 150–400)
RBC: 4.54 MIL/uL (ref 4.22–5.81)
RDW: 14 % (ref 11.5–15.5)
WBC: 14.1 10*3/uL — ABNORMAL HIGH (ref 4.0–10.5)
nRBC: 0 % (ref 0.0–0.2)

## 2020-11-24 LAB — CK: Total CK: 410 U/L — ABNORMAL HIGH (ref 49–397)

## 2020-11-24 LAB — ETHANOL: Alcohol, Ethyl (B): <10 mg/dL (ref <10)

## 2020-11-24 MED ORDER — ONDANSETRON HCL 4 MG/2ML IJ SOLN: 4.0000 mg | Freq: Four times a day (QID) | INTRAMUSCULAR | Status: DC | PRN

## 2020-11-24 MED ORDER — SODIUM CHLORIDE 0.9 % IV BOLUS
1000.0000 mL | Freq: Once | INTRAVENOUS | Status: AC
  Administered 2020-11-24: 1000 mL via INTRAVENOUS

## 2020-11-24 MED ORDER — ACETAMINOPHEN 650 MG RE SUPP: 650.0000 mg | RECTAL | Status: DC | PRN

## 2020-11-24 MED ORDER — TIMOLOL MALEATE 0.5 % OP SOLN
1.0000 [drp] | Freq: Every day | OPHTHALMIC | Status: DC
  Administered 2020-11-25 – 2020-11-26 (×2): 1 [drp] via OPHTHALMIC
  Filled 2020-11-24: qty 5

## 2020-11-24 MED ORDER — IOHEXOL 350 MG/ML SOLN
80.0000 mL | Freq: Once | INTRAVENOUS | Status: AC | PRN
  Administered 2020-11-24: 80 mL via INTRAVENOUS

## 2020-11-24 MED ORDER — NETARSUDIL-LATANOPROST 0.02-0.005 % OP SOLN: 1.0000 [drp] | Freq: Every day | OPHTHALMIC | Status: DC

## 2020-11-24 MED ORDER — LATANOPROST 0.005 % OP SOLN
1.0000 [drp] | Freq: Every day | OPHTHALMIC | Status: DC
  Administered 2020-11-25 (×2): 1 [drp] via OPHTHALMIC
  Filled 2020-11-24: qty 2.5

## 2020-11-24 MED ORDER — ACETAMINOPHEN 325 MG PO TABS: 650.0000 mg | ORAL_TABLET | ORAL | Status: DC | PRN

## 2020-11-24 MED ORDER — ONDANSETRON HCL 4 MG PO TABS: 4.0000 mg | ORAL_TABLET | Freq: Four times a day (QID) | ORAL | Status: DC | PRN

## 2020-11-24 MED ORDER — METOPROLOL SUCCINATE ER 50 MG PO TB24
50.0000 mg | ORAL_TABLET | Freq: Every day | ORAL | Status: DC
  Administered 2020-11-25 – 2020-11-26 (×2): 50 mg via ORAL
  Filled 2020-11-24 (×2): qty 1

## 2020-11-24 MED ORDER — ROSUVASTATIN CALCIUM 20 MG PO TABS
40.0000 mg | ORAL_TABLET | Freq: Every day | ORAL | Status: DC
  Administered 2020-11-25 – 2020-11-26 (×2): 40 mg via ORAL
  Filled 2020-11-24 (×2): qty 2

## 2020-11-24 MED ORDER — LEVETIRACETAM IN NACL 1500 MG/100ML IV SOLN
1500.0000 mg | Freq: Once | INTRAVENOUS | Status: AC
  Administered 2020-11-24: 1500 mg via INTRAVENOUS
  Filled 2020-11-24: qty 100

## 2020-11-24 NOTE — ED Notes (Signed)
Sent the sbar to floor.

## 2020-11-24 NOTE — ED Notes (Signed)
Pt had witnessed seizure activity during triage.  Pt became diaphoretic does not recall event and now reporting dizziness and feeling hot.

## 2020-11-24 NOTE — H&P (Signed)
History and Physical    Angel Benson QQI:[id number] DOB: 08-10-1950 DOA: 11/24/2020  PCP: [name], [place]  Patient coming from: Home  I have personally briefly reviewed patient's old medical records in [place]  Chief Complaint: Hematuria and rectal bleeding after prostate biopsy  HPI: Angel Benson is a 70 y.o. male with medical history significant for CAD s/p CABG, T2DM, CKD stage IIIb, HTN, HLD, prostate cancer, diverticulosis, vitamin B12 deficiency who presented to the ED for evaluation of hematuria and rectal bleeding.  Patient underwent prostate biopsy earlier today (11/10).  When he returned home he states he had 3 episodes of bleeding from his rectum as well as hematuria when he used the bathroom.  He says the third time he became lightheaded and diaphoretic and nearly passed out.  He says while waiting in the ED he again became lightheaded and diaphoretic and then does not recall what happened afterwards.  This was witnessed in the ED and described as a grand mal seizure-like event with convulsions followed by postictal state.  He says he previously was taking low-dose aspirin 81 mg daily however stopped it 2 weeks ago on his own because of the planned prostate biopsy.  He denies any personal history of seizures.  He denies any subjective fevers, chills, diaphoresis, chest pain, dyspnea, dysuria.  ED Course:  Initial vitals showed BP 147/81, pulse 111, RR 16, temp 97.6 F, SPO2 92% on room air.  Labs show WBC 14.1, hemoglobin 12.8, platelets 158,000, sodium 138, potassium 3.8, bicarb 24, BUN 24, creatinine 1.86 (baseline ~1.7), serum glucose 245, CKD 410, serum ethanol undetectable.  Urinalysis shows negative nitrates, negative leukocytes, >50 RBC/hpf, 6-10 WBC/hpf, no bacteria microscopy.  CT head without contrast is negative for acute intracranial process.  CT abdomen/pelvis with contrast shows nonobstructive 2 mm left nephrolithiasis, tiny hiatal  hernia, prostatomegaly, aortic atherosclerosis.  Per EDP, while in the triage area patient had a witnessed seizure-like episode described as grand mal convulsions, apnea, and postictal state.  Patient was given 1 L normal saline and loaded with IV Keppra 1500 mg.  Case was discussed with neurology who recommended work-up MRI and routine EEG.  The hospitalist service was consulted to admit for further evaluation and management.  Review of Systems: All systems reviewed and are negative except as documented in history of present illness above.   Past Medical History:  Diagnosis Date   Anemia    CAD, multiple vessel    s/p CABG   Cataract    bilateral cataract removed   Chicken pox    Diabetes mellitus without complication ([place])    Type 2   GERD (gastroesophageal reflux disease)    Glaucoma    Hypercholesteremia    Hypertension    Myocardial infarction [facility]) 2015   then CABG   Prostate CA ([place]) 2022   "watching it" - no sx planned at this time (09/05/2020)   Tuberculosis 1993   received tx post exposure to person with TB   Urinary tract bacterial infections    Vitamin B12 deficiency     Past Surgical History:  Procedure Laterality Date   CARDIAC CATHETERIZATION     CATARACT EXTRACTION, BILATERAL     CORONARY ARTERY BYPASS GRAFT N/A 08/26/2013   Procedure: CORONARY ARTERY BYPASS GRAFTING (CABG) times five, using left internal mammary artery, right and left greater saphenous vein;  Surgeon: Grace Isaac, MD;  Location: [place];  Service: Open Heart Surgery;  Laterality: N/A;  LIMA-LAD; SEQ SVG-OM1-OM2-OM3; SVG-RCA  INTRAOPERATIVE TRANSESOPHAGEAL ECHOCARDIOGRAM N/A 08/26/2013   Procedure: INTRAOPERATIVE TRANSESOPHAGEAL ECHOCARDIOGRAM;  Surgeon: Grace Isaac, MD;  Location: [place];  Service: Open Heart Surgery;  Laterality: N/A;    Social History:  reports that he has never smoked. He has been exposed to tobacco smoke. He has never used smokeless tobacco. He reports that  he does not drink alcohol and does not use drugs.  No Known Allergies  Family History  Problem Relation Age of Onset   Diabetes Mother    Alzheimer's disease Mother    Heart disease Father    Diabetes Father    Heart disease Brother    Colon polyps Neg Hx    Colon cancer Neg Hx    Esophageal cancer Neg Hx    Rectal cancer Neg Hx    Stomach cancer Neg Hx      Prior to Admission medications   Medication Sig Start Date End Date Taking? Authorizing Provider  amoxicillin (AMOXIL) 500 MG tablet Take 500 mg by mouth every 6 (six) hours. 11/17/20  Yes [provider]  aspirin 81 MG tablet Take 81 mg by mouth daily.   Yes [provider]  cholecalciferol (VITAMIN D3) 25 MCG (1000 UNIT) tablet Take 1,000 Units by mouth daily.   Yes [provider]  losartan (COZAAR) 50 MG tablet Take 1 tablet (50 mg total) by mouth daily. 07/19/20  Yes [name], FNP  metoprolol succinate (TOPROL-XL) 50 MG 24 hr tablet TAKE 1 TABLET DAILY WITH OR IMMEDIATELY FOLLOWING A MEAL 07/19/20  Yes [name], FNP  Multiple Vitamin (MULTIVITAMIN) tablet Take 1 tablet by mouth daily.   Yes [provider]  Netarsudil-Latanoprost (ROCKLATAN) 0.02-0.005 % SOLN Place 1 drop into the left eye at bedtime.   Yes [provider]  Omega-3 Fatty Acids (FISH OIL) 1000 MG CAPS Take 1 capsule by mouth at bedtime.   Yes [provider]  rosuvastatin (CRESTOR) 40 MG tablet Take 1 tablet (40 mg total) by mouth daily. 07/19/20  Yes [name], FNP  SIMBRINZA 1-0.2 % SUSP Place 1 drop into both eyes in the morning and at bedtime. 05/22/19  Yes [provider]  timolol (TIMOPTIC) 0.5 % ophthalmic solution Place 1 drop into the left eye every morning. 04/28/19  Yes [provider]    Physical Exam: Vitals:   11/24/20 1830 11/24/20 1930 11/24/20 2100 11/24/20 2130  BP: 121/69 131/67 131/73 122/73  Pulse: 73 78 80 84  Resp: 18 18 (!) 21  13  Temp:      TempSrc:      SpO2: 100% 100% 100% 100%  Weight:      Height:       Constitutional: Resting supine in bed, NAD, calm, comfortable Eyes: PERRL, lids and conjunctivae normal ENMT: Mucous membranes are moist. Posterior pharynx clear of any exudate or lesions.Normal dentition.  Neck: normal, supple, no masses. Respiratory: clear to auscultation bilaterally, no wheezing, no crackles. Normal respiratory effort. No accessory muscle use.  Cardiovascular: Regular rate and rhythm, no murmurs / rubs / gallops. No extremity edema. 2+ pedal pulses.  Well-healed sternotomy scar. Abdomen: no tenderness, no masses palpated. No hepatosplenomegaly. Bowel sounds positive.  Musculoskeletal: no clubbing / cyanosis. No joint deformity upper and lower extremities. Good ROM, no contractures. Normal muscle tone.  Skin: no rashes, lesions, ulcers. No induration Neurologic: CN 2-12 grossly intact. Sensation intact. Strength 5/5 in all 4.  Psychiatric: Normal judgment and insight. Alert and oriented x 3. Normal  mood.   Labs on Admission: I have personally reviewed following labs and imaging studies  CBC: Recent Labs  Lab 11/24/20 1814  WBC 14.1*  NEUTROABS 10.5*  HGB 12.8*  HCT 40.0  MCV 88.1  PLT 258   Basic Metabolic Panel: Recent Labs  Lab 11/24/20 1814  NA 138  K 3.8  CL 104  CO2 24  GLUCOSE 245*  BUN 24*  CREATININE 1.86*  CALCIUM 8.6*   GFR: Estimated Creatinine Clearance: 38 mL/min (A) (by C-G formula based on SCr of 1.86 mg/dL (H)). Liver Function Tests: No results for input(s): AST, ALT, ALKPHOS, BILITOT, PROT, ALBUMIN in the last 168 hours. No results for input(s): LIPASE, AMYLASE in the last 168 hours. No results for input(s): AMMONIA in the last 168 hours. Coagulation Profile: No results for input(s): INR, PROTIME in the last 168 hours. Cardiac Enzymes: Recent Labs  Lab 11/24/20 1814  CKTOTAL 410*   BNP (last 3 results) No results for input(s): PROBNP in the  last 8760 hours. HbA1C: No results for input(s): HGBA1C in the last 72 hours. CBG: No results for input(s): GLUCAP in the last 168 hours. Lipid Profile: No results for input(s): CHOL, HDL, LDLCALC, TRIG, CHOLHDL, LDLDIRECT in the last 72 hours. Thyroid Function Tests: No results for input(s): TSH, T4TOTAL, FREET4, T3FREE, THYROIDAB in the last 72 hours. Anemia Panel: No results for input(s): VITAMINB12, FOLATE, FERRITIN, TIBC, IRON, RETICCTPCT in the last 72 hours. Urine analysis:    Component Value Date/Time   COLORURINE RED (A) 11/24/2020 2200   APPEARANCEUR CLOUDY (A) 11/24/2020 2200   LABSPEC >1.046 (H) 11/24/2020 2200   PHURINE 5.0 11/24/2020 2200   GLUCOSEU 50 (A) 11/24/2020 2200   GLUCOSEU 100 (A) 06/22/2019 1100   HGBUR MODERATE (A) 11/24/2020 2200   BILIRUBINUR NEGATIVE 11/24/2020 2200   KETONESUR 5 (A) 11/24/2020 2200   PROTEINUR 100 (A) 11/24/2020 2200   UROBILINOGEN 0.2 06/22/2019 1100   NITRITE NEGATIVE 11/24/2020 2200   LEUKOCYTESUR NEGATIVE 11/24/2020 2200    Radiological Exams on Admission: CT HEAD WO CONTRAST (5MM)  Result Date: 11/24/2020 CLINICAL DATA:  Seizure, nontraumatic EXAM: CT HEAD WITHOUT CONTRAST TECHNIQUE: Contiguous axial images were obtained from the base of the skull through the vertex without intravenous contrast. COMPARISON:  None. FINDINGS: Brain: No evidence of acute infarction, hemorrhage, cerebral edema, mass, mass effect, or midline shift. No hydrocephalus or extra-axial fluid collection. Vascular: No hyperdense vessel. Skull: Normal. Negative for fracture or focal lesion. Sinuses/Orbits: No acute finding. Status post bilateral lens replacements. Other: The mastoid air cells are well aerated. IMPRESSION: No acute intracranial process. Electronically Signed   By: Merilyn Baba M.D.   On: 11/24/2020 19:29   CT ABDOMEN PELVIS W CONTRAST  Result Date: 11/24/2020 CLINICAL DATA:  Hematuria, unknown cause EXAM: CT ABDOMEN AND PELVIS WITH CONTRAST  TECHNIQUE: Multidetector CT imaging of the abdomen and pelvis was performed using the standard protocol following bolus administration of intravenous contrast. CONTRAST:  28mL OMNIPAQUE IOHEXOL 350 MG/ML SOLN COMPARISON:  None. FINDINGS: Lower chest: No acute abnormality.  Tiny hiatal hernia. Hepatobiliary: No focal liver abnormality. No gallstones, gallbladder wall thickening, or pericholecystic fluid. No biliary dilatation. Pancreas: No focal lesion. Normal pancreatic contour. No surrounding inflammatory changes. No main pancreatic ductal dilatation. Spleen: Normal in size without focal abnormality. Splenule noted. Adrenals/Urinary Tract: No adrenal nodule bilaterally. Bilateral kidneys enhance symmetrically. Couple of 27mm calcified stones in the left kidney. No hydronephrosis. No hydroureter. The urinary bladder is unremarkable. On delayed imaging, there is  no urothelial wall thickening and there are no filling defects in the opacified portions of the bilateral collecting systems or ureters. Stomach/Bowel: Stomach is within normal limits. No evidence of bowel wall thickening or dilatation. Appendix appears normal. Vascular/Lymphatic: No abdominal aorta or iliac aneurysm. At least moderate atherosclerotic plaque of the aorta and its branches. No abdominal, pelvic, or inguinal lymphadenopathy. Reproductive: The prostate is enlarged measuring up to 5.5 cm. Other: No intraperitoneal free fluid. No intraperitoneal free gas. No organized fluid collection. Musculoskeletal: No abdominal wall hernia or abnormality. No suspicious lytic or blastic osseous lesions. No acute displaced fracture. Multilevel degenerative changes of the spine. IMPRESSION: 1. Nonobstructive 84mm left nephrolithiasis. 2. Tiny hiatal hernia. 3. Prostatomegaly. 4.  Aortic Atherosclerosis (ICD10-I70.0). Electronically Signed   By: Iven Finn M.D.   On: 11/24/2020 19:49    EKG: Personally reviewed. Normal sinus rhythm with wandering leads.  Prior  EKG showed sinus bradycardia.  Assessment/Plan Principal Problem:   Seizure-like activity (HCC) Active Problems:   Hyperlipidemia associated with type 2 diabetes mellitus ([place])   Coronary artery disease involving native coronary artery of native heart without angina pectoris   Type 2 diabetes mellitus with complication ([place])   Hypertension associated with diabetes ([place])   Elevated CK   CKD (chronic kidney disease), stage III ([place])   Left nephrolithiasis   Leukocytosis   Angel Benson is a 70 y.o. male with medical history significant for CAD s/p CABG, T2DM, CKD stage IIIb, HTN, HLD, prostate cancer, diverticulosis, vitamin B12 deficiency who is admitted for evaluation of seizure-like activity.  Syncope versus seizure-like activity: Patient reports vasovagal type syncopal episode while at home after third bloody bowel movement.  He then had a reported witnessed seizure-like episode in the ED, described as grand mal type seizure with convulsions and apneic appearance. -Seizure precautions -Obtain MRI brain without contrast -Routine EEG -Check orthostatic vitals -Monitor H&H  Hematuria and rectal bleeding after prostate biopsy (11/10): Reports several episodes at home after undergoing prostate biopsy.  Monitor closely for continued bleeding.  Continue to hold home aspirin and pharmacologic VTE prophylaxis.  Elevated CK: CK 410, likely secondary to seizure-like event.  Continue IV NS@125  mL/hour overnight.  Recheck CK in AM.  Leukocytosis: Suspect reactive to seizure-like event.  Patient was given a course of prophylactic antibiotics prior to his prostate biopsy.  CKD stage IIIb: Relatively stable.  Continue to monitor.  CAD s/p CABG: Chronic and stable, denies any chest pain.  Continue rosuvastatin, holding aspirin for now.  Type 2 diabetes: Not currently on medical management as an outpatient.  Place on SSI.  Hypertension: Currently stable.  Continue Toprol-XL, hold  losartan for now.  Hyperlipidemia: Continue rosuvastatin.  Left-sided nephrolithiasis: Nonobstructive 2 mm left nephrolithiasis noted on CT imaging.  Appears to be asymptomatic and hematuria likely related to prostate biopsy.  DVT prophylaxis: SCDs Code Status: Full code, confirmed with patient on admission Family Communication: Discussed with patient, he has discussed with family Disposition Plan: From home, dispo pending clinical progress Consults called: None Level of care: Telemetry Admission status:  Status is: Observation  The patient remains OBS appropriate and will d/c before 2 midnights.  Zada Finders MD [facility]  If 7PM-7AM, please contact night-coverage [email or web address]  11/24/2020, 10:37 PM

## 2020-11-24 NOTE — ED Provider Notes (Signed)
[name] DEPT Provider Note   CSN: [id number] Arrival date & time: 11/24/20  1710     History Chief Complaint  Patient presents with   Hematuria    Angel Benson is a 70 y.o. male.  This is a 70 y.o. male with significant medical history as below, including CAD, hld, htn, mi, prostate cancer who presents to the ED with complaint of hematuria, blood in stool, possible seizure. Pt reports he had prostate biopsy earlier today, began to have hematuria after the procedure, some blood with his stool. No thinners. No pain with urination or defecation, no abd pain. Normal state of health prior to this event. He was having a bowel movement and felt light headed, fell to the ground and believes he lost consciousness. No history of this in the past. In the lobby pt observed to be having seizure like activity/myoclonic jerking, agonal breathing, unresponsive. Resolved after around 20 seconds. He is at baseline now and does not recall this event. Reports he felt light headed/diaphoretic while in the lobby prior to this but otherwise didn't feel abnormal. No fevers, chills, IVDU, etoh use, recent medication changes.   The history is provided by the patient. No language interpreter was used.  Hematuria Pertinent negatives include no chest pain, no abdominal pain, no headaches and no shortness of breath.      Past Medical History:  Diagnosis Date   Anemia    CAD, multiple vessel    s/p CABG   Cataract    bilateral cataract removed   Chicken pox    Diabetes mellitus without complication ([place])    Type 2   GERD (gastroesophageal reflux disease)    Glaucoma    Hypercholesteremia    Hypertension    Myocardial infarction [facility] 2015   then CABG   Prostate CA ([place]) 2022   "watching it" - no sx planned at this time (09/05/2020)   Tuberculosis 1993   received tx post exposure to person with TB   Urinary tract bacterial infections    Vitamin B12 deficiency      Patient Active Problem List   Diagnosis Date Noted   Seizure-like activity ([place]) 11/24/2020   Hypertension associated with diabetes ([place]) 11/24/2020   Elevated CK 11/24/2020   CKD (chronic kidney disease), stage III ([place]) 11/24/2020   Left nephrolithiasis 11/24/2020   Leukocytosis 11/24/2020   B12 deficiency 07/14/2020   Anemia 07/14/2020   Thrombocytopenia ([place]) 07/14/2020   Cardiomyopathy, ischemic 09/27/2014   Routine general medical examination at a health care facility 02/01/2014   S/P CABG x 5 08/26/2013   Hyperlipidemia associated with type 2 diabetes mellitus ([place]) 08/25/2013   Coronary artery disease involving native coronary artery of native heart without angina pectoris 08/25/2013   Benign essential HTN 08/25/2013   Type 2 diabetes mellitus with complication ([place]) 82/42/3536   NSTEMI (non-ST elevated myocardial infarction) ([place]) 08/24/2013    Past Surgical History:  Procedure Laterality Date   CARDIAC CATHETERIZATION     CATARACT EXTRACTION, BILATERAL     CORONARY ARTERY BYPASS GRAFT N/A 08/26/2013   Procedure: CORONARY ARTERY BYPASS GRAFTING (CABG) times five, using left internal mammary artery, right and left greater saphenous vein;  Surgeon: Grace Isaac, MD;  Location: [facility];  Service: Open Heart Surgery;  Laterality: N/A;  LIMA-LAD; SEQ SVG-OM1-OM2-OM3; SVG-RCA    INTRAOPERATIVE TRANSESOPHAGEAL ECHOCARDIOGRAM N/A 08/26/2013   Procedure: INTRAOPERATIVE TRANSESOPHAGEAL ECHOCARDIOGRAM;  Surgeon: Grace Isaac, MD;  Location: [facility];  Service: Open Heart Surgery;  Laterality: N/A;       Family History  Problem Relation Age of Onset   Diabetes Mother    Alzheimer's disease Mother    Heart disease Father    Diabetes Father    Heart disease Brother    Colon polyps Neg Hx    Colon cancer Neg Hx    Esophageal cancer Neg Hx    Rectal cancer Neg Hx    Stomach cancer Neg Hx     Social History   Tobacco Use   Smoking status: Never    Passive  exposure: Past   Smokeless tobacco: Never  Vaping Use   Vaping Use: Never used  Substance Use Topics   Alcohol use: No   Drug use: No    Home Medications Prior to Admission medications   Medication Sig Start Date End Date Taking? Authorizing Provider  amoxicillin (AMOXIL) 500 MG tablet Take 500 mg by mouth every 6 (six) hours. 11/17/20  Yes [provider]  aspirin 81 MG tablet Take 81 mg by mouth daily.   Yes [provider]  cholecalciferol (VITAMIN D3) 25 MCG (1000 UNIT) tablet Take 1,000 Units by mouth daily.   Yes [provider]  losartan (COZAAR) 50 MG tablet Take 1 tablet (50 mg total) by mouth daily. 07/19/20  Yes [name], FNP  metoprolol succinate (TOPROL-XL) 50 MG 24 hr tablet TAKE 1 TABLET DAILY WITH OR IMMEDIATELY FOLLOWING A MEAL 07/19/20  Yes [name], FNP  Multiple Vitamin (MULTIVITAMIN) tablet Take 1 tablet by mouth daily.   Yes [provider]  Netarsudil-Latanoprost (ROCKLATAN) 0.02-0.005 % SOLN Place 1 drop into the left eye at bedtime.   Yes [provider]  Omega-3 Fatty Acids (FISH OIL) 1000 MG CAPS Take 1 capsule by mouth at bedtime.   Yes [provider]  rosuvastatin (CRESTOR) 40 MG tablet Take 1 tablet (40 mg total) by mouth daily. 07/19/20  Yes [name], FNP  SIMBRINZA 1-0.2 % SUSP Place 1 drop into both eyes in the morning and at bedtime. 05/22/19  Yes [provider]  timolol (TIMOPTIC) 0.5 % ophthalmic solution Place 1 drop into the left eye every morning. 04/28/19  Yes [provider]    Allergies    Patient has no known allergies.  Review of Systems   Review of Systems  Constitutional:  Negative for chills and fever.  HENT:  Negative for facial swelling and trouble swallowing.   Eyes:  Negative for photophobia and visual disturbance.  Respiratory:  Negative for cough and shortness of breath.   Cardiovascular:  Negative for chest pain and  palpitations.  Gastrointestinal:  Positive for blood in stool. Negative for abdominal pain, nausea and vomiting.  Endocrine: Negative for polydipsia and polyuria.  Genitourinary:  Positive for hematuria. Negative for difficulty urinating.  Musculoskeletal:  Negative for gait problem and joint swelling.  Skin:  Negative for pallor and rash.  Neurological:  Positive for seizures and syncope. Negative for headaches.  Psychiatric/Behavioral:  Negative for agitation and confusion.    Physical Exam Updated Vital Signs BP 131/72 (BP Location: Left Arm)   Pulse 81   Temp 98.2 F (36.8 C) (Oral)   Resp 18   Ht 5\' 9"  (1.753 m)   Wt 81.6 kg   SpO2 100%   BMI 26.58 kg/m   Physical Exam Vitals and nursing note reviewed.  Constitutional:      General: He is not in acute distress.    Appearance: Normal  appearance. He is well-developed.  HENT:     Head: Normocephalic and atraumatic.     Right Ear: External ear normal.     Left Ear: External ear normal.     Mouth/Throat:     Mouth: Mucous membranes are moist.  Eyes:     General: No scleral icterus.    Extraocular Movements: Extraocular movements intact.     Pupils: Pupils are equal, round, and reactive to light.  Cardiovascular:     Rate and Rhythm: Normal rate and regular rhythm.     Pulses: Normal pulses.     Heart sounds: Normal heart sounds.  Pulmonary:     Effort: Pulmonary effort is normal. No respiratory distress.     Breath sounds: Normal breath sounds.  Abdominal:     General: Abdomen is flat.     Palpations: Abdomen is soft.     Tenderness: There is no abdominal tenderness.  Musculoskeletal:        General: Normal range of motion.     Cervical back: Normal range of motion.     Right lower leg: No edema.     Left lower leg: No edema.  Skin:    General: Skin is warm and dry.     Capillary Refill: Capillary refill takes less than 2 seconds.  Neurological:     Mental Status: He is alert and oriented to person, place, and  time.     GCS: GCS eye subscore is 4. GCS verbal subscore is 5. GCS motor subscore is 6.     Cranial Nerves: Cranial nerves 2-12 are intact. No dysarthria or facial asymmetry.     Sensory: Sensation is intact.     Motor: Motor function is intact.     Coordination: Coordination is intact. Finger-Nose-Finger Test normal.  Psychiatric:        Mood and Affect: Mood normal.        Behavior: Behavior normal.    ED Results / Procedures / Treatments   Labs (all labs ordered are listed, but only abnormal results are displayed) Labs Reviewed  CBC WITH DIFFERENTIAL/PLATELET - Abnormal; Notable for the following components:      Result Value   WBC 14.1 (*)    Hemoglobin 12.8 (*)    Neutro Abs 10.5 (*)    Monocytes Absolute 1.1 (*)    All other components within normal limits  BASIC METABOLIC PANEL - Abnormal; Notable for the following components:   Glucose, Bld 245 (*)    BUN 24 (*)    Creatinine, Ser 1.86 (*)    Calcium 8.6 (*)    GFR, Estimated 39 (*)    All other components within normal limits  URINALYSIS, ROUTINE W REFLEX MICROSCOPIC - Abnormal; Notable for the following components:   Color, Urine RED (*)    APPearance CLOUDY (*)    Specific Gravity, Urine >1.046 (*)    Glucose, UA 50 (*)    Hgb urine dipstick MODERATE (*)    Ketones, ur 5 (*)    Protein, ur 100 (*)    RBC / HPF >50 (*)    All other components within normal limits  CK - Abnormal; Notable for the following components:   Total CK 410 (*)    All other components within normal limits  GLUCOSE, CAPILLARY - Abnormal; Notable for the following components:   Glucose-Capillary 159 (*)    All other components within normal limits  RESP PANEL BY RT-PCR (FLU A&B, COVID) ARPGX2  ETHANOL  CBC  COMPREHENSIVE METABOLIC PANEL  HEMOGLOBIN AND HEMATOCRIT, BLOOD  CK  HEMOGLOBIN A1C    EKG EKG Interpretation  Date/Time:  Thursday November 24 2020 19:37:20 EST Ventricular Rate:  80 PR Interval:  108 QRS  Duration: 93 QT Interval:  409 QTC Calculation: 472 R Axis:   87 Text Interpretation: Sinus rhythm Short PR interval Borderline right axis deviation Baseline wander in lead(s) I III aVL Confirmed by Wynona Dove ([id number]) on 11/25/2020 1:17:42 AM  Radiology CT HEAD WO CONTRAST (5MM)  Result Date: 11/24/2020 CLINICAL DATA:  Seizure, nontraumatic EXAM: CT HEAD WITHOUT CONTRAST TECHNIQUE: Contiguous axial images were obtained from the base of the skull through the vertex without intravenous contrast. COMPARISON:  None. FINDINGS: Brain: No evidence of acute infarction, hemorrhage, cerebral edema, mass, mass effect, or midline shift. No hydrocephalus or extra-axial fluid collection. Vascular: No hyperdense vessel. Skull: Normal. Negative for fracture or focal lesion. Sinuses/Orbits: No acute finding. Status post bilateral lens replacements. Other: The mastoid air cells are well aerated. IMPRESSION: No acute intracranial process. Electronically Signed   By: Merilyn Baba M.D.   On: 11/24/2020 19:29   CT ABDOMEN PELVIS W CONTRAST  Result Date: 11/24/2020 CLINICAL DATA:  Hematuria, unknown cause EXAM: CT ABDOMEN AND PELVIS WITH CONTRAST TECHNIQUE: Multidetector CT imaging of the abdomen and pelvis was performed using the standard protocol following bolus administration of intravenous contrast. CONTRAST:  43mL OMNIPAQUE IOHEXOL 350 MG/ML SOLN COMPARISON:  None. FINDINGS: Lower chest: No acute abnormality.  Tiny hiatal hernia. Hepatobiliary: No focal liver abnormality. No gallstones, gallbladder wall thickening, or pericholecystic fluid. No biliary dilatation. Pancreas: No focal lesion. Normal pancreatic contour. No surrounding inflammatory changes. No main pancreatic ductal dilatation. Spleen: Normal in size without focal abnormality. Splenule noted. Adrenals/Urinary Tract: No adrenal nodule bilaterally. Bilateral kidneys enhance symmetrically. Couple of 22mm calcified stones in the left kidney. No hydronephrosis.  No hydroureter. The urinary bladder is unremarkable. On delayed imaging, there is no urothelial wall thickening and there are no filling defects in the opacified portions of the bilateral collecting systems or ureters. Stomach/Bowel: Stomach is within normal limits. No evidence of bowel wall thickening or dilatation. Appendix appears normal. Vascular/Lymphatic: No abdominal aorta or iliac aneurysm. At least moderate atherosclerotic plaque of the aorta and its branches. No abdominal, pelvic, or inguinal lymphadenopathy. Reproductive: The prostate is enlarged measuring up to 5.5 cm. Other: No intraperitoneal free fluid. No intraperitoneal free gas. No organized fluid collection. Musculoskeletal: No abdominal wall hernia or abnormality. No suspicious lytic or blastic osseous lesions. No acute displaced fracture. Multilevel degenerative changes of the spine. IMPRESSION: 1. Nonobstructive 32mm left nephrolithiasis. 2. Tiny hiatal hernia. 3. Prostatomegaly. 4.  Aortic Atherosclerosis (ICD10-I70.0). Electronically Signed   By: Iven Finn M.D.   On: 11/24/2020 19:49    Procedures Procedures   Medications Ordered in ED Medications  acetaminophen (TYLENOL) tablet 650 mg (has no administration in time range)    Or  acetaminophen (TYLENOL) suppository 650 mg (has no administration in time range)  ondansetron (ZOFRAN) tablet 4 mg (has no administration in time range)    Or  ondansetron (ZOFRAN) injection 4 mg (has no administration in time range)  metoprolol succinate (TOPROL-XL) 24 hr tablet 50 mg (has no administration in time range)  rosuvastatin (CRESTOR) tablet 40 mg (has no administration in time range)  timolol (TIMOPTIC) 0.5 % ophthalmic solution 1 drop (has no administration in time range)  latanoprost (XALATAN) 0.005 % ophthalmic solution 1 drop (1 drop Left Eye Given 11/25/20 0050)  insulin  aspart (novoLOG) injection 0-9 Units (has no administration in time range)  insulin aspart (novoLOG)  injection 0-5 Units (0 Units Subcutaneous Not Given 11/25/20 0048)  0.9 %  sodium chloride infusion ( Intravenous New Bag/Given 11/25/20 0056)  sodium chloride 0.9 % bolus 1,000 mL (0 mLs Intravenous Stopped 11/24/20 2124)  levETIRAcetam (KEPPRA) IVPB 1500 mg/ 100 mL premix (0 mg Intravenous Stopped 11/24/20 2028)  iohexol (OMNIPAQUE) 350 MG/ML injection 80 mL (80 mLs Intravenous Contrast Given 11/24/20 1912)    ED Course  I have reviewed the triage vital signs and the nursing notes.  Pertinent labs & imaging results that were available during my care of the patient were reviewed by me and considered in my medical decision making (see chart for details).    MDM Rules/Calculators/A&P                         {   CC: hematuria, seizure  This patient complains of above; this involves an extensive number of treatment options and is a complaint that carries with it a high risk of complications and morbidity. Vital signs were reviewed. Serious etiologies considered.  Record review:  Previous records obtained and reviewed    Work up as above, notable for:  Labs & imaging results that were available during my care of the patient were reviewed by me and considered in my medical decision making.   I ordered imaging studies which included CT a/p CTH and I independently visualized and interpreted imaging which showed no acute process  Lab workup in ED re-assuring, hgb is stable. CPK is elevated, I have high suspicion for seizure. Pt with observed seizure in triage, also reported having similar episode at home which was not witnessed. Given 2x seizure within 12 hour period will give pt Keppra and recommend admission for seizure workup (eeg/MRI), pt is agreeable.   D/w Dr Posey Pronto who accepts pt for admission. Dr Posey Pronto requests I speak with neurology regarding recommendations for workup, this has been completed and Dr Posey Pronto notified of neuro recommendations.       This chart was dictated using  voice recognition software.  Despite best efforts to proofread,  errors can occur which can change the documentation meaning.  Final Clinical Impression(s) / ED Diagnoses Final diagnoses:  Seizure ([place])  Hematuria, unspecified type    Rx / DC Orders ED Discharge Orders     None        Jeanell Sparrow, DO 11/25/20 0118

## 2020-11-24 NOTE — ED Notes (Signed)
Pt care taken, no complaints at this time, going to ct now

## 2020-11-24 NOTE — ED Notes (Signed)
Pt did vomit once

## 2020-11-24 NOTE — ED Triage Notes (Signed)
Pt had prostate biopsy done today and has experienced hematuria since that time.

## 2020-11-24 NOTE — ED Provider Notes (Signed)
Emergency Medicine Provider Triage Evaluation Note  Angel Benson , a 70 y.o. male  was evaluated in triage.  Pt complains of bleeding s/p prostate biopsy today. He has had several episodes of dark red blood.  Review of Systems  Positive: Bleeding s/p prostate biopsy Negative: fevers  Physical Exam  BP (!) 147/81 (BP Location: Left Arm)   Pulse (!) 111   Temp 97.6 F (36.4 C) (Oral)   Resp 16   SpO2 92%  Gen:   Awake, no distress   Resp:  Normal effort  MSK:   Moves extremities without difficulty  Other:    Medical Decision Making  Medically screening exam initiated at 5:49 PM.  Appropriate orders placed.  Mazin Emma was informed that the remainder of the evaluation will be completed by another provider, this initial triage assessment does not replace that evaluation, and the importance of remaining in the ED until their evaluation is complete.     Bishop Dublin 11/24/20 1749    Davonna Belling, MD 11/25/20 [phone]

## 2020-11-25 ENCOUNTER — Observation Stay (HOSPITAL_COMMUNITY)
Admit: 2020-11-25 | Discharge: 2020-11-25 | Disposition: A | Discharge status: Home / Self Care | Payer: Medicare Other | Attending: Internal Medicine | Admitting: Internal Medicine

## 2020-11-25 ENCOUNTER — Observation Stay (HOSPITAL_COMMUNITY): Payer: Medicare Other

## 2020-11-25 DIAGNOSIS — K921 Melena: Secondary | ICD-10-CM

## 2020-11-25 DIAGNOSIS — R310 Gross hematuria: Secondary | ICD-10-CM | POA: Diagnosis not present

## 2020-11-25 DIAGNOSIS — R710 Precipitous drop in hematocrit: Secondary | ICD-10-CM

## 2020-11-25 DIAGNOSIS — R569 Unspecified convulsions: Secondary | ICD-10-CM

## 2020-11-25 DIAGNOSIS — D696 Thrombocytopenia, unspecified: Secondary | ICD-10-CM

## 2020-11-25 DIAGNOSIS — K625 Hemorrhage of anus and rectum: Secondary | ICD-10-CM | POA: Diagnosis not present

## 2020-11-25 DIAGNOSIS — N1831 Chronic kidney disease, stage 3a: Secondary | ICD-10-CM | POA: Diagnosis not present

## 2020-11-25 DIAGNOSIS — E785 Hyperlipidemia, unspecified: Secondary | ICD-10-CM | POA: Diagnosis not present

## 2020-11-25 DIAGNOSIS — I152 Hypertension secondary to endocrine disorders: Secondary | ICD-10-CM | POA: Diagnosis not present

## 2020-11-25 DIAGNOSIS — E1169 Type 2 diabetes mellitus with other specified complication: Secondary | ICD-10-CM | POA: Diagnosis not present

## 2020-11-25 DIAGNOSIS — R319 Hematuria, unspecified: Secondary | ICD-10-CM | POA: Diagnosis not present

## 2020-11-25 DIAGNOSIS — E1159 Type 2 diabetes mellitus with other circulatory complications: Secondary | ICD-10-CM | POA: Diagnosis not present

## 2020-11-25 LAB — COMPREHENSIVE METABOLIC PANEL
ALT: 14 U/L (ref 0–44)
AST: 18 U/L (ref 15–41)
Albumin: 2.9 g/dL — ABNORMAL LOW (ref 3.5–5.0)
Alkaline Phosphatase: 37 U/L — ABNORMAL LOW (ref 38–126)
Anion gap: 5 (ref 5–15)
BUN: 19 mg/dL (ref 8–23)
CO2: 25 mmol/L (ref 22–32)
Calcium: 7.8 mg/dL — ABNORMAL LOW (ref 8.9–10.3)
Chloride: 111 mmol/L (ref 98–111)
Creatinine, Ser: 1.41 mg/dL — ABNORMAL HIGH (ref 0.61–1.24)
GFR, Estimated: 54 mL/min — ABNORMAL LOW (ref >60)
Glucose, Bld: 139 mg/dL — ABNORMAL HIGH (ref 70–99)
Potassium: 4.3 mmol/L (ref 3.5–5.1)
Sodium: 141 mmol/L (ref 135–145)
Total Bilirubin: 0.6 mg/dL (ref 0.3–1.2)
Total Protein: 5.4 g/dL — ABNORMAL LOW (ref 6.5–8.1)

## 2020-11-25 LAB — GLUCOSE, CAPILLARY
Glucose-Capillary: 102 mg/dL — ABNORMAL HIGH (ref 70–99)
Glucose-Capillary: 131 mg/dL — ABNORMAL HIGH (ref 70–99)
Glucose-Capillary: 159 mg/dL — ABNORMAL HIGH (ref 70–99)
Glucose-Capillary: 67 mg/dL — ABNORMAL LOW (ref 70–99)
Glucose-Capillary: 69 mg/dL — ABNORMAL LOW (ref 70–99)
Glucose-Capillary: 73 mg/dL (ref 70–99)
Glucose-Capillary: 88 mg/dL (ref 70–99)

## 2020-11-25 LAB — CBC
HCT: 30.6 % — ABNORMAL LOW (ref 39.0–52.0)
Hemoglobin: 9.9 g/dL — ABNORMAL LOW (ref 13.0–17.0)
MCH: 28.1 pg (ref 26.0–34.0)
MCHC: 32.4 g/dL (ref 30.0–36.0)
MCV: 86.9 fL (ref 80.0–100.0)
Platelets: 109 10*3/uL — ABNORMAL LOW (ref 150–400)
RBC: 3.52 MIL/uL — ABNORMAL LOW (ref 4.22–5.81)
RDW: 13.8 % (ref 11.5–15.5)
WBC: 8.9 10*3/uL (ref 4.0–10.5)
nRBC: 0 % (ref 0.0–0.2)

## 2020-11-25 LAB — HEMOGLOBIN A1C
Hgb A1c MFr Bld: 6.8 % — ABNORMAL HIGH (ref 4.8–5.6)
Mean Plasma Glucose: 148.46 mg/dL

## 2020-11-25 LAB — HEMOGLOBIN AND HEMATOCRIT, BLOOD
HCT: 30.9 % — ABNORMAL LOW (ref 39.0–52.0)
Hemoglobin: 10 g/dL — ABNORMAL LOW (ref 13.0–17.0)

## 2020-11-25 LAB — RESP PANEL BY RT-PCR (FLU A&B, COVID) ARPGX2
Influenza A by PCR: NEGATIVE
Influenza B by PCR: NEGATIVE
SARS Coronavirus 2 by RT PCR: NEGATIVE

## 2020-11-25 LAB — CK: Total CK: 261 U/L (ref 49–397)

## 2020-11-25 MED ORDER — SODIUM CHLORIDE 0.9 % IV SOLN: INTRAVENOUS | Status: AC

## 2020-11-25 MED ORDER — SODIUM CHLORIDE 0.9 % IV SOLN: INTRAVENOUS | Status: DC

## 2020-11-25 MED ORDER — INSULIN ASPART 100 UNIT/ML IJ SOLN: 0.0000 [IU] | Freq: Every day | INTRAMUSCULAR | Status: DC

## 2020-11-25 MED ORDER — INSULIN ASPART 100 UNIT/ML IJ SOLN: 0.0000 [IU] | Freq: Three times a day (TID) | INTRAMUSCULAR | Status: DC

## 2020-11-25 NOTE — Progress Notes (Signed)
Pt did vomit as soon as he was in the room.

## 2020-11-25 NOTE — Progress Notes (Signed)
PROGRESS NOTE    Angel Benson  KGU:[id number] DOB: [birth year] DOA: 11/24/2020 PCP: [name], FNP    Brief Narrative:  70 year old with a history of hypertension, diabetes, chronic kidney disease stage III, underwent prostate biopsy on 11/10.  Post procedure, he did develop some hematuria as well as blood in his stool.  Reports having about 3 bowel movements containing bright red blood at home.  Subsequently became diaphoretic and had a spell of passing out.  He came to the hospital for evaluation where he was evaluated in the emergency room and had a possible seizure episode.  He is continued to have some blood per rectum, although he reports that his hematuria started to clear.  He is undergoing further work-up for possible seizure with MRI and EEG.  Hemoglobin is being followed.  I reached out to both urology and gastroenterology for further input.  We will discuss with neurology once EEG results are available.   Assessment & Plan:   Principal Problem:   Seizure-like activity ([place]) Active Problems:   Hyperlipidemia associated with type 2 diabetes mellitus ([place])   Coronary artery disease involving native coronary artery of native heart without angina pectoris   Type 2 diabetes mellitus with complication (HCC)   Hypertension associated with diabetes (HCC)   Elevated CK   CKD (chronic kidney disease), stage III (HCC)   Left nephrolithiasis   Leukocytosis   Syncope versus seizure -History is not entirely clear -Reports having an episode at home which was unwitnessed.  Patient describes being diaphoretic and subsequently passing out.  He is unsure as to how long he was out, but denies any prolonged postictal period.  Did not have any urinary or bowel incontinence. -He had another episode after coming to the hospital in triage.  He repeatedly felt diaphoretic prior to episode.  Records indicate that he had generalized tonic-clonic event with postictal period, although  patient feels that his mental status returned to baseline rather quickly. -He did receive a loading dose of Keppra yesterday. -MRI of the brain was ordered that was found to be unremarkable -EEG also ordered, currently pending -Checking orthostatic vital signs -Once EEG results available, we will plan on discussing with neurology regarding further management  Hematuria and rectal bleeding after prostate biopsy (11/10) -We will reach out to his urologist, Dr. Gloriann Loan -Reports that overall hematuria appears to be clearing -Still having some fresh blood in stool -He has previously seen Dr. Loletha Carrow and had colonoscopy on 09/09/2020 that showed diverticulosis -Suspect is GI bleeding is related to transrectal prostate biopsy -Since bloody stools have persisted into today, Will reach out to [facility]  Acute blood loss anemia -Likely related to blood loss from hematuria/bloody stools -Hemoglobin 12.8 on admission, this is trended down to 9.9 today -Continue to follow -Transfuse for hemoglobin less than 8  Type 2 diabetes -Does not appear to be on chronic medical therapy, likely diet controlled -A1c 6.8 -Continue to follow on sliding scale  AKI on chronic kidney disease stage IIIa -Initial creatinine noted to be 1.8 on admission -Likely had some degree of volume depletion -Renal function has improved to 1.4 with IV fluids -Continue to monitor  Thrombocytopenia -Chronic issue, thought to be related to B12 deficiency -Continue to monitor  Hyperlipidemia -Continue on statin  Hypertension -Currently on home dose of Toprol -Holding home dose of Cozaar due to elevated creatinine 70 year old male   DVT prophylaxis: SCDs Start: 11/24/20 2317  Code Status: Full code Family Communication: Discussed with patient Disposition  Plan: Status is: Observation  The patient remains OBS appropriate and will d/c before 2 midnights.        Consultants:    Procedures:    Antimicrobials:       Subjective: Reports that hematuria is starting to clear.  Still having some red blood with bowel movements.  Has had 3 bowel movements prior to admission, 2 since coming to the hospital.  Stools becoming more formed.  Objective: Vitals:   11/25/20 0535 11/25/20 0537 11/25/20 0724 11/25/20 0929  BP: (!) 124/53 126/66 112/63 (!) 130/55  Pulse:   70 70  Resp:   20   Temp:   98.4 F (36.9 C)   TempSrc:   Oral   SpO2:   96%   Weight:      Height:        Intake/Output Summary (Last 24 hours) at 11/25/2020 1303 Last data filed at 11/25/2020 1217 Gross per 24 hour  Intake 946.61 ml  Output 345 ml  Net 601.61 ml   Filed Weights   11/24/20 1752  Weight: 81.6 kg    Examination:  General exam: Appears calm and comfortable  Respiratory system: Clear to auscultation. Respiratory effort normal. Cardiovascular system: S1 & S2 heard, RRR. No JVD, murmurs, rubs, gallops or clicks. No pedal edema. Gastrointestinal system: Abdomen is nondistended, soft and nontender. No organomegaly or masses felt. Normal bowel sounds heard. Central nervous system: Alert and oriented. No focal neurological deficits. Extremities: Symmetric 5 x 5 power. Skin: No rashes, lesions or ulcers Psychiatry: Judgement and insight appear normal. Mood & affect appropriate.     Data Reviewed: I have personally reviewed following labs and imaging studies  CBC: Recent Labs  Lab 11/24/20 1814 11/25/20 0443  WBC 14.1* 8.9  NEUTROABS 10.5*  --   HGB 12.8* 9.9*  10.0*  HCT 40.0 30.6*  30.9*  MCV 88.1 86.9  PLT 158 606*   Basic Metabolic Panel: Recent Labs  Lab 11/24/20 1814 11/25/20 0443  NA 138 141  K 3.8 4.3  CL 104 111  CO2 24 25  GLUCOSE 245* 139*  BUN 24* 19  CREATININE 1.86* 1.41*  CALCIUM 8.6* 7.8*   GFR: Estimated Creatinine Clearance: 50.1 mL/min (A) (by C-G formula based on SCr of 1.41 mg/dL (H)). Liver Function Tests: Recent Labs  Lab 11/25/20 0443  AST 18  ALT 14  ALKPHOS 37*   BILITOT 0.6  PROT 5.4*  ALBUMIN 2.9*   No results for input(s): LIPASE, AMYLASE in the last 168 hours. No results for input(s): AMMONIA in the last 168 hours. Coagulation Profile: No results for input(s): INR, PROTIME in the last 168 hours. Cardiac Enzymes: Recent Labs  Lab 11/24/20 1814 11/25/20 0443  CKTOTAL 410* 261   BNP (last 3 results) No results for input(s): PROBNP in the last 8760 hours. HbA1C: Recent Labs    11/25/20 0443  HGBA1C 6.8*   CBG: Recent Labs  Lab 11/25/20 0048 11/25/20 0922 11/25/20 1138 11/25/20 1212  GLUCAP 159* 88 67* 102*   Lipid Profile: No results for input(s): CHOL, HDL, LDLCALC, TRIG, CHOLHDL, LDLDIRECT in the last 72 hours. Thyroid Function Tests: No results for input(s): TSH, T4TOTAL, FREET4, T3FREE, THYROIDAB in the last 72 hours. Anemia Panel: No results for input(s): VITAMINB12, FOLATE, FERRITIN, TIBC, IRON, RETICCTPCT in the last 72 hours. Sepsis Labs: No results for input(s): PROCALCITON, LATICACIDVEN in the last 168 hours.  Recent Results (from the past 240 hour(s))  Resp Panel by RT-PCR (Flu A&B, Covid) Nasopharyngeal  Swab     Status: None   Collection Time: 11/25/20  5:32 AM   Specimen: Nasopharyngeal Swab; Nasopharyngeal(NP) swabs in vial transport medium  Result Value Ref Range Status   SARS Coronavirus 2 by RT PCR NEGATIVE NEGATIVE Final    Comment: (NOTE) SARS-CoV-2 target nucleic acids are NOT DETECTED.  The SARS-CoV-2 RNA is generally detectable in upper respiratory specimens during the acute phase of infection. The lowest concentration of SARS-CoV-2 viral copies this assay can detect is 138 copies/mL. A negative result does not preclude SARS-Cov-2 infection and should not be used as the sole basis for treatment or other patient management decisions. A negative result may occur with  improper specimen collection/handling, submission of specimen other than nasopharyngeal swab, presence of viral mutation(s) within  the areas targeted by this assay, and inadequate number of viral copies(<138 copies/mL). A negative result must be combined with clinical observations, patient history, and epidemiological information. The expected result is Negative.  Fact Sheet for Patients:  EntrepreneurPulse.com.au  Fact Sheet for Healthcare Providers:  IncredibleEmployment.be  This test is no t yet approved or cleared by the [place] FDA and  has been authorized for detection and/or diagnosis of SARS-CoV-2 by FDA under an Emergency Use Authorization (EUA). This EUA will remain  in effect (meaning this test can be used) for the duration of the COVID-19 declaration under Section 564(b)(1) of the Act, 21 U.S.C.section 360bbb-3(b)(1), unless the authorization is terminated  or revoked sooner.       Influenza A by PCR NEGATIVE NEGATIVE Final   Influenza B by PCR NEGATIVE NEGATIVE Final    Comment: (NOTE) The Xpert Xpress SARS-CoV-2/FLU/RSV plus assay is intended as an aid in the diagnosis of influenza from Nasopharyngeal swab specimens and should not be used as a sole basis for treatment. Nasal washings and aspirates are unacceptable for Xpert Xpress SARS-CoV-2/FLU/RSV testing.  Fact Sheet for Patients: EntrepreneurPulse.com.au  Fact Sheet for Healthcare Providers: IncredibleEmployment.be  This test is not yet approved or cleared by the [place] FDA and has been authorized for detection and/or diagnosis of SARS-CoV-2 by FDA under an Emergency Use Authorization (EUA). This EUA will remain in effect (meaning this test can be used) for the duration of the COVID-19 declaration under Section 564(b)(1) of the Act, 21 U.S.C. section 360bbb-3(b)(1), unless the authorization is terminated or revoked.  Performed at [facility], [place] [address], [place], [place] [address]          Radiology Studies: CT HEAD  WO CONTRAST (5MM)  Result Date: 11/24/2020 CLINICAL DATA:  Seizure, nontraumatic EXAM: CT HEAD WITHOUT CONTRAST TECHNIQUE: Contiguous axial images were obtained from the base of the skull through the vertex without intravenous contrast. COMPARISON:  None. FINDINGS: Brain: No evidence of acute infarction, hemorrhage, cerebral edema, mass, mass effect, or midline shift. No hydrocephalus or extra-axial fluid collection. Vascular: No hyperdense vessel. Skull: Normal. Negative for fracture or focal lesion. Sinuses/Orbits: No acute finding. Status post bilateral lens replacements. Other: The mastoid air cells are well aerated. IMPRESSION: No acute intracranial process. Electronically Signed   By: Merilyn Baba M.D.   On: 11/24/2020 19:29   MR BRAIN WO CONTRAST  Result Date: 11/25/2020 CLINICAL DATA:  Seizure, nontraumatic (Age >= 41y) EXAM: MRI HEAD WITHOUT CONTRAST TECHNIQUE: Multiplanar, multiecho pulse sequences of the brain and surrounding structures were obtained without intravenous contrast. COMPARISON:  CT head November 24, 2020. FINDINGS: Brain: No acute infarction, hemorrhage, hydrocephalus, extra-axial collection or mass lesion. Minimal T2 hyperintensity within  the white matter, within normal limits for patient age. The hippocampi are within normal limits and symmetric in size/signal. Vascular: Major arterial flow voids are maintained at the skull base. Skull and upper cervical spine: Normal marrow signal. Sinuses/Orbits: Minimal paranasal sinus mucosal thickening. Unremarkable orbits. Other: No sizable mastoid effusions. IMPRESSION: No evidence of acute intracranial abnormality. Electronically Signed   By: Margaretha Sheffield M.D.   On: 11/25/2020 09:19   CT ABDOMEN PELVIS W CONTRAST  Result Date: 11/24/2020 CLINICAL DATA:  Hematuria, unknown cause EXAM: CT ABDOMEN AND PELVIS WITH CONTRAST TECHNIQUE: Multidetector CT imaging of the abdomen and pelvis was performed using the standard protocol  following bolus administration of intravenous contrast. CONTRAST:  6mL OMNIPAQUE IOHEXOL 350 MG/ML SOLN COMPARISON:  None. FINDINGS: Lower chest: No acute abnormality.  Tiny hiatal hernia. Hepatobiliary: No focal liver abnormality. No gallstones, gallbladder wall thickening, or pericholecystic fluid. No biliary dilatation. Pancreas: No focal lesion. Normal pancreatic contour. No surrounding inflammatory changes. No main pancreatic ductal dilatation. Spleen: Normal in size without focal abnormality. Splenule noted. Adrenals/Urinary Tract: No adrenal nodule bilaterally. Bilateral kidneys enhance symmetrically. Couple of 35mm calcified stones in the left kidney. No hydronephrosis. No hydroureter. The urinary bladder is unremarkable. On delayed imaging, there is no urothelial wall thickening and there are no filling defects in the opacified portions of the bilateral collecting systems or ureters. Stomach/Bowel: Stomach is within normal limits. No evidence of bowel wall thickening or dilatation. Appendix appears normal. Vascular/Lymphatic: No abdominal aorta or iliac aneurysm. At least moderate atherosclerotic plaque of the aorta and its branches. No abdominal, pelvic, or inguinal lymphadenopathy. Reproductive: The prostate is enlarged measuring up to 5.5 cm. Other: No intraperitoneal free fluid. No intraperitoneal free gas. No organized fluid collection. Musculoskeletal: No abdominal wall hernia or abnormality. No suspicious lytic or blastic osseous lesions. No acute displaced fracture. Multilevel degenerative changes of the spine. IMPRESSION: 1. Nonobstructive 42mm left nephrolithiasis. 2. Tiny hiatal hernia. 3. Prostatomegaly. 4.  Aortic Atherosclerosis (ICD10-I70.0). Electronically Signed   By: Iven Finn M.D.   On: 11/24/2020 19:49        Scheduled Meds:  insulin aspart  0-5 Units Subcutaneous QHS   insulin aspart  0-9 Units Subcutaneous TID WC   latanoprost  1 drop Left Eye QHS   metoprolol succinate   50 mg Oral Daily   rosuvastatin  40 mg Oral Daily   timolol  1 drop Left Eye Daily   Continuous Infusions:   LOS: 0 days    Time spent: 47mins    Kathie Dike, MD [facility]   If 7PM-7AM, please contact night-coverage [email or web address]  11/25/2020, 1:03 PM

## 2020-11-25 NOTE — Progress Notes (Signed)
EEG complete - results pending 

## 2020-11-25 NOTE — Procedures (Signed)
Patient Name: Angel Benson  MRN: [id number]  Epilepsy Attending: Lora Havens  Referring Physician/Provider: Dr Zada Finders Date: 11/25/2020 Duration: 22.53 mins  Patient history: 70 year old male with an episode of feeling diaphoretic and subsequently passing out.  EEG evaluate for seizure.  Level of alertness: Awake, asleep  AEDs during EEG study: None  Technical aspects: This EEG study was done with scalp electrodes positioned according to the 10-20 International system of electrode placement. Electrical activity was acquired at a sampling rate of 500Hz  and reviewed with a high frequency filter of 70Hz  and a low frequency filter of 1Hz . EEG data were recorded continuously and digitally stored.   Description: The posterior dominant rhythm consists of 8.5 Hz activity of moderate voltage (25-35 uV) seen predominantly in posterior head regions, symmetric and reactive to eye opening and eye closing. Sleep was characterized by vertex waves, sleep spindles (12 to 14 Hz), maximal frontocentral region. Hyperventilation and photic stimulation were not performed.     IMPRESSION: This study is within normal limits. No seizures or epileptiform discharges were seen throughout the recording.  Zyion Doxtater Barbra Sarks

## 2020-11-25 NOTE — Progress Notes (Signed)
   11/25/20 1658  Provider Notification  Provider Name/Title Dr. Roderic Palau  Date Provider Notified 11/25/20  Time Provider Notified 1710  Notification Type Page  Notification Reason Critical result  Test performed and critical result CBG  Date Critical Result Received 11/25/20  Time Critical Result Received 1658  Provider response Other (Comment) (Patient treated per protocol.)  Date of Provider Response 11/25/20  Time of Provider Response 1720   Patient asymptomatic for hypoglycemia. Patient treated with 8 oz of juice, and CBG re-check was 73. Patient states that he did not eat lunch. Patient has been changed from clear liquid diet to carb modified diet. Dinner has been ordered. Will continue to monitor.

## 2020-11-25 NOTE — Plan of Care (Signed)
  Problem: Education: Goal: Knowledge of General Education information will improve Description: Including pain rating scale, medication(s)/side effects and non-pharmacologic comfort measures 11/25/2020 0658 by Berton Mount, RN Outcome: Progressing 11/25/2020 [place] by Berton Mount, RN Outcome: Progressing   Problem: Clinical Measurements: Goal: Ability to maintain clinical measurements within normal limits will improve 11/25/2020 0658 by Berton Mount, RN Outcome: Progressing 11/25/2020 0657 by Berton Mount, RN Outcome: Progressing   Problem: Coping: Goal: Level of anxiety will decrease 11/25/2020 0658 by Berton Mount, RN Outcome: Progressing 11/25/2020 0657 by Berton Mount, RN Outcome: Progressing

## 2020-11-25 NOTE — Progress Notes (Signed)
Hypoglycemic Event  CBG: 67  Treatment: 8 oz juice/soda  Symptoms: None  Follow-up CBG: Time: 1215 CBG Result:102  Possible Reasons for Event: Inadequate meal intake  Comments/MD notified: Dr. Roderic Palau notified. No new orders ordered at this time. Patient is stable with no hypoglycemic symptoms. Will continue to monitor.   11/25/20 1215  Provider Notification  Provider Name/Title Dr. Roderic Palau  Date Provider Notified 11/25/20  Time Provider Notified 1215  Notification Type Face-to-face  Notification Reason Critical result  Test performed and critical result CBG  Date Critical Result Received 11/25/20  Time Critical Result Received 1200  Provider response Other (Comment) (Protocol initiated and patient treated.)  Date of Provider Response 11/25/20  Time of Provider Response 1215      Layla Maw

## 2020-11-25 NOTE — Consult Note (Addendum)
Referring Provider: [facility] PCP: [name], FNP  Gastroenterologist: Wilfrid Lund, MD Reason for consultation:   rectal bleeding post tranrectal prostate biopsy                ASSESSMENT / PLAN   #  70 yo male with painless hematuria and rectal bleeding since tranrectal biopsy of prostate yesterday.  Last BM with blood was ~ two hours ago.    # Hematuria post prostate biopsy. TRH contacting Urology. Hematuria has nearly resolved  # Acute blood loss anemia. Baseline hgb ~ 13.4 >>12.8 >>> 9.9.  --Continue to trend hgb. Hopefully will not require blood transfusion if we can get bleeding to stop  # Syncope vrs seizure. Unwitnessed episode at home but then seizure like activity in ED waiting room. Head CT scan and brain MRI unrevealing. EEG pending.   # Additional medical history listed below.      [facility] Attending   I have taken an interval history, reviewed the chart and examined the patient. I agree with the Advanced Practitioner's note, impression and recommendations.  Majority the medical decision-making in the formulation of the assessment and plan were performed by me.  The bleeding appears to be slowing.  I think it is unlikely any rectal intervention by me would be helpful.  In my experience with these they are self-limited and stopped on their own.  I am going to let him eat and we will reassess tomorrow sooner as needed.  Note that he has mild thrombocytopenia and its been fluctuating over time its not new.  Should have enough platelets for clotting.  He has not had an INR checked and for completeness I will do that.  Gatha Mayer, MD, [facility] 11/25/2020 3:31 PM   HISTORY OF PRESENT ILLNESS                                                                                                                         Chief Complaint:  rectal bleeding  Angel Benson is a 70 y.o. male with a past medical history significant  for CAD, HLD, HTN, CKD III, kidney stones, prostate cancer, adenomatous colon polys, diverticulosis. See PMH for any additional medical problems.   Patient presented to ED yesterday. He had a transrectal biopsy of prostate yesterday around noon. After arriving home he had hematuria and loose bloody BMs x 3.   While sitting on the toilet he became lightheaded and fell over. Patient believes he lost consciousness. He was brought to the ED. While in the ED he was having seizure like activity with jerking, agonal breathing, unresponsiveness. Symptoms resolved after about 20 seconds per EDP's note.  No prior history of seizures.  No acute findings on head CT scan or MRI of brain CTAP w/ contrast negative for acute findings, + enlarged prostate and 64mm non-obstructing nephrolithiasis. Presenting hgb around 6 pm was 12.8 ( baseline ~ 13.4), it declined to 9.9 overnight. Today he has had  two additional bloody stools. The first one was loose but he says the second one ~ 2 hours ago was more solid with a moderate amount of red blood. The  hematuria has nearly resolved. He isn't having any abdominal pain or urinary pain.        Imaging:  CT HEAD WO CONTRAST (5MM)  Result Date: 11/24/2020 CLINICAL DATA:  Seizure, nontraumatic EXAM: CT HEAD WITHOUT CONTRAST TECHNIQUE: Contiguous axial images were obtained from the base of the skull through the vertex without intravenous contrast. COMPARISON:  None. FINDINGS: Brain: No evidence of acute infarction, hemorrhage, cerebral edema, mass, mass effect, or midline shift. No hydrocephalus or extra-axial fluid collection. Vascular: No hyperdense vessel. Skull: Normal. Negative for fracture or focal lesion. Sinuses/Orbits: No acute finding. Status post bilateral lens replacements. Other: The mastoid air cells are well aerated. IMPRESSION: No acute intracranial process. Electronically Signed   By: Merilyn Baba M.D.   On: 11/24/2020 19:29   MR BRAIN WO CONTRAST  Result Date:  11/25/2020 CLINICAL DATA:  Seizure, nontraumatic (Age >= 41y) EXAM: MRI HEAD WITHOUT CONTRAST TECHNIQUE: Multiplanar, multiecho pulse sequences of the brain and surrounding structures were obtained without intravenous contrast. COMPARISON:  CT head November 24, 2020. FINDINGS: Brain: No acute infarction, hemorrhage, hydrocephalus, extra-axial collection or mass lesion. Minimal T2 hyperintensity within the white matter, within normal limits for patient age. The hippocampi are within normal limits and symmetric in size/signal. Vascular: Major arterial flow voids are maintained at the skull base. Skull and upper cervical spine: Normal marrow signal. Sinuses/Orbits: Minimal paranasal sinus mucosal thickening. Unremarkable orbits. Other: No sizable mastoid effusions. IMPRESSION: No evidence of acute intracranial abnormality. Electronically Signed   By: Margaretha Sheffield M.D.   On: 11/25/2020 09:19   CT ABDOMEN PELVIS W CONTRAST  Result Date: 11/24/2020 CLINICAL DATA:  Hematuria, unknown cause EXAM: CT ABDOMEN AND PELVIS WITH CONTRAST TECHNIQUE: Multidetector CT imaging of the abdomen and pelvis was performed using the standard protocol following bolus administration of intravenous contrast. CONTRAST:  26mL OMNIPAQUE IOHEXOL 350 MG/ML SOLN COMPARISON:  None. FINDINGS: Lower chest: No acute abnormality.  Tiny hiatal hernia. Hepatobiliary: No focal liver abnormality. No gallstones, gallbladder wall thickening, or pericholecystic fluid. No biliary dilatation. Pancreas: No focal lesion. Normal pancreatic contour. No surrounding inflammatory changes. No main pancreatic ductal dilatation. Spleen: Normal in size without focal abnormality. Splenule noted. Adrenals/Urinary Tract: No adrenal nodule bilaterally. Bilateral kidneys enhance symmetrically. Couple of 61mm calcified stones in the left kidney. No hydronephrosis. No hydroureter. The urinary bladder is unremarkable. On delayed imaging, there is no urothelial wall  thickening and there are no filling defects in the opacified portions of the bilateral collecting systems or ureters. Stomach/Bowel: Stomach is within normal limits. No evidence of bowel wall thickening or dilatation. Appendix appears normal. Vascular/Lymphatic: No abdominal aorta or iliac aneurysm. At least moderate atherosclerotic plaque of the aorta and its branches. No abdominal, pelvic, or inguinal lymphadenopathy. Reproductive: The prostate is enlarged measuring up to 5.5 cm. Other: No intraperitoneal free fluid. No intraperitoneal free gas. No organized fluid collection. Musculoskeletal: No abdominal wall hernia or abnormality. No suspicious lytic or blastic osseous lesions. No acute displaced fracture. Multilevel degenerative changes of the spine. IMPRESSION: 1. Nonobstructive 13mm left nephrolithiasis. 2. Tiny hiatal hernia. 3. Prostatomegaly. 4.  Aortic Atherosclerosis (ICD10-I70.0). Electronically Signed   By: Iven Finn M.D.   On: 11/24/2020 19:49       PREVIOUS ENDOSCOPIC EVALUATIONS  / IMAGING STUDIES   Aug 2022 colonoscopy for Heme positive  stool and history of small adenoma --complete exam, excellent prep. Diverticulosis  Past Medical History:  Diagnosis Date   Anemia    CAD, multiple vessel    s/p CABG   Cataract    bilateral cataract removed   Chicken pox    Diabetes mellitus without complication ([place])    Type 2   GERD (gastroesophageal reflux disease)    Glaucoma    Hypercholesteremia    Hypertension    Myocardial infarction [facility]) 2015   then CABG   Prostate CA ([place]) 2022   "watching it" - no sx planned at this time (09/05/2020)   Tuberculosis 1993   received tx post exposure to person with TB   Urinary tract bacterial infections    Vitamin B12 deficiency     Past Surgical History:  Procedure Laterality Date   CARDIAC CATHETERIZATION     CATARACT EXTRACTION, BILATERAL     CORONARY ARTERY BYPASS GRAFT N/A 08/26/2013   Procedure: CORONARY ARTERY BYPASS  GRAFTING (CABG) times five, using left internal mammary artery, right and left greater saphenous vein;  Surgeon: Grace Isaac, MD;  Location: [place];  Service: Open Heart Surgery;  Laterality: N/A;  LIMA-LAD; SEQ SVG-OM1-OM2-OM3; SVG-RCA    INTRAOPERATIVE TRANSESOPHAGEAL ECHOCARDIOGRAM N/A 08/26/2013   Procedure: INTRAOPERATIVE TRANSESOPHAGEAL ECHOCARDIOGRAM;  Surgeon: Grace Isaac, MD;  Location: [place];  Service: Open Heart Surgery;  Laterality: N/A;    Prior to Admission medications   Medication Sig Start Date End Date Taking? Authorizing Provider  amoxicillin (AMOXIL) 500 MG tablet Take 500 mg by mouth every 6 (six) hours. 11/17/20  Yes [provider]  aspirin 81 MG tablet Take 81 mg by mouth daily.   Yes [provider]  cholecalciferol (VITAMIN D3) 25 MCG (1000 UNIT) tablet Take 1,000 Units by mouth daily.   Yes [provider]  losartan (COZAAR) 50 MG tablet Take 1 tablet (50 mg total) by mouth daily. 07/19/20  Yes [name], FNP  metoprolol succinate (TOPROL-XL) 50 MG 24 hr tablet TAKE 1 TABLET DAILY WITH OR IMMEDIATELY FOLLOWING A MEAL 07/19/20  Yes [name], FNP  Multiple Vitamin (MULTIVITAMIN) tablet Take 1 tablet by mouth daily.   Yes [provider]  Netarsudil-Latanoprost (ROCKLATAN) 0.02-0.005 % SOLN Place 1 drop into the left eye at bedtime.   Yes [provider]  Omega-3 Fatty Acids (FISH OIL) 1000 MG CAPS Take 1 capsule by mouth at bedtime.   Yes [provider]  rosuvastatin (CRESTOR) 40 MG tablet Take 1 tablet (40 mg total) by mouth daily. 07/19/20  Yes [name], FNP  SIMBRINZA 1-0.2 % SUSP Place 1 drop into both eyes in the morning and at bedtime. 05/22/19  Yes [provider]  timolol (TIMOPTIC) 0.5 % ophthalmic solution Place 1 drop into the left eye every morning. 04/28/19  Yes [provider]    Current Facility-Administered Medications  Medication Dose Route  Frequency Provider Last Rate Last Admin   0.9 %  sodium chloride infusion   Intravenous Continuous Kathie Dike, MD 75 mL/hr at 11/25/20 1346 New Bag at 11/25/20 1346   acetaminophen (TYLENOL) tablet 650 mg  650 mg Oral Q4H PRN Lenore Cordia, MD       Or   acetaminophen (TYLENOL) suppository 650 mg  650 mg Rectal Q4H PRN Zada Finders R, MD       insulin aspart (novoLOG) injection 0-5 Units  0-5 Units Subcutaneous QHS Lenore Cordia, MD  insulin aspart (novoLOG) injection 0-9 Units  0-9 Units Subcutaneous TID WC Patel, Vishal R, MD       latanoprost (XALATAN) 0.005 % ophthalmic solution 1 drop  1 drop Left Eye QHS Zada Finders R, MD   1 drop at 11/25/20 0050   metoprolol succinate (TOPROL-XL) 24 hr tablet 50 mg  50 mg Oral Daily Zada Finders R, MD   50 mg at 11/25/20 0929   ondansetron (ZOFRAN) tablet 4 mg  4 mg Oral Q6H PRN Lenore Cordia, MD       Or   ondansetron (ZOFRAN) injection 4 mg  4 mg Intravenous Q6H PRN Lenore Cordia, MD       rosuvastatin (CRESTOR) tablet 40 mg  40 mg Oral Daily Lenore Cordia, MD   40 mg at 11/25/20 6237   timolol (TIMOPTIC) 0.5 % ophthalmic solution 1 drop  1 drop Left Eye Daily Lenore Cordia, MD   1 drop at 11/25/20 0943    Allergies as of 11/24/2020   (No Known Allergies)    Family History  Problem Relation Age of Onset   Diabetes Mother    Alzheimer's disease Mother    Heart disease Father    Diabetes Father    Heart disease Brother    Colon polyps Neg Hx    Colon cancer Neg Hx    Esophageal cancer Neg Hx    Rectal cancer Neg Hx    Stomach cancer Neg Hx     Social History   Socioeconomic History   Marital status: Divorced    Spouse name: Not on file   Number of children: 1   Years of education: 12   Highest education level: Not on file  Occupational History   Occupation: Retired - Physicist, medical  Tobacco Use   Smoking status: Never    Passive exposure: Past   Smokeless tobacco: Never  Vaping Use   Vaping Use: Never  used  Substance and Sexual Activity   Alcohol use: No   Drug use: No   Sexual activity: Not on file  Other Topics Concern   Not on file  Social History Narrative   Born in [place] and raised in [place]. Currently resides in an apartment with his sister. Fun: Basketball   Denies religious beliefs effecting healthcare.    Social Determinants of Health   Financial Resource Strain: Not on file  Food Insecurity: Not on file  Transportation Needs: Not on file  Physical Activity: Not on file  Stress: Not on file  Social Connections: Not on file  Intimate Partner Violence: Not on file    Review of Systems: All systems reviewed and negative except where noted in HPI.   OBJECTIVE    Physical Exam: Vital signs in last 24 hours: Temp:  [97.6 F (36.4 C)-98.6 F (37 C)] 98.6 F (37 C) (11/11 1315) Pulse Rate:  [70-111] 73 (11/11 1315) Resp:  [13-21] 18 (11/11 1315) BP: (112-147)/(53-81) 121/57 (11/11 1315) SpO2:  [92 %-100 %] 98 % (11/11 1315) Weight:  [81.6 kg] 81.6 kg (11/10 1752) Last BM Date: 11/25/20  General:  Alert male in NAD Psych:  Pleasant, cooperative. Normal mood and affect Eyes: Pupils equal, no icterus. Conjunctive pink Ears:  Normal auditory acuity Nose: No deformity, discharge or lesions Neck:  Supple, no masses felt Lungs:  Clear to auscultation.  Heart:  Regular rate, regular rhythm. No lower extremity edema Abdomen:  Soft, nondistended, nontender, active bowel sounds, no masses felt Rectal :  Deferred Msk: Symmetrical without gross deformities.  Neurologic:  Alert, oriented, grossly normal neurologically Skin:  Intact without significant lesions.    Scheduled inpatient medications  insulin aspart  0-5 Units Subcutaneous QHS   insulin aspart  0-9 Units Subcutaneous TID WC   latanoprost  1 drop Left Eye QHS   metoprolol succinate  50 mg Oral Daily   rosuvastatin  40 mg Oral Daily   timolol  1 drop Left Eye Daily      Intake/Output from  previous day: 11/10 0701 - 11/11 0700 In: 714.7 [I.V.:714.7] Out: 220 [Urine:220] Intake/Output this shift: Total I/O In: 431.9 [I.V.:431.9] Out: 125 [Urine:125]   Lab Results: Recent Labs    11/24/20 1814 11/25/20 0443  WBC 14.1* 8.9  HGB 12.8* 9.9*  10.0*  HCT 40.0 30.6*  30.9*  PLT 158 109*   BMET Recent Labs    11/24/20 1814 11/25/20 0443  NA 138 141  K 3.8 4.3  CL 104 111  CO2 24 25  GLUCOSE 245* 139*  BUN 24* 19  CREATININE 1.86* 1.41*  CALCIUM 8.6* 7.8*   LFTs Recent Labs    11/25/20 0443  PROT 5.4*  ALBUMIN 2.9*  AST 18  ALT 14  ALKPHOS 37*  BILITOT 0.6      . CBC Latest Ref Rng & Units 11/25/2020 11/25/2020 11/24/2020  WBC 4.0 - 10.5 K/uL 8.9 - 14.1(H)  Hemoglobin 13.0 - 17.0 g/dL 9.9(L) 10.0(L) 12.8(L)  Hematocrit 39.0 - 52.0 % 30.6(L) 30.9(L) 40.0  Platelets 150 - 400 K/uL 109(L) - 158    . CMP Latest Ref Rng & Units 11/25/2020 11/24/2020 08/15/2020  Glucose 70 - 99 mg/dL 139(H) 245(H) 98  BUN 8 - 23 mg/dL 19 24(H) 22  Creatinine 0.61 - 1.24 mg/dL 1.41(H) 1.86(H) 1.74(H)  Sodium 135 - 145 mmol/L 141 138 143  Potassium 3.5 - 5.1 mmol/L 4.3 3.8 5.1  Chloride 98 - 111 mmol/L 111 104 109  CO2 22 - 32 mmol/L 25 24 28   Calcium 8.9 - 10.3 mg/dL 7.8(L) 8.6(L) 9.5  Total Protein 6.5 - 8.1 g/dL 5.4(L) - 7.1  Total Bilirubin 0.3 - 1.2 mg/dL 0.6 - 0.5  Alkaline Phos 38 - 126 U/L 37(L) - 52  AST 15 - 41 U/L 18 - 19  ALT 0 - 44 U/L 14 - 19     Tye Savoy, NP-C @  11/25/2020, 2:22 PM

## 2020-11-25 NOTE — Consult Note (Signed)
H&P Physician requesting consult: Kathie Dike  Chief Complaint: Gross hematuria, rectal bleeding  History of Present Illness: 70 year old male underwent MRI/ultrasound fusion guided biopsy yesterday in the clinic.  He presented to the emergency department with gross hematuria and rectal bleeding.  While in the waiting room, he had a possible seizure that was witnessed.  He was noted this morning to have a drop in hemoglobin from 12.8 down to 10.  Patient currently undergoing an EEG.  In speaking with him, he states that his gross hematuria is improving.  He also states that his rectal bleeding has improved.  Nursing states that his last bowel movement was brown rather than red.  There was maybe a little bit of red but not a large amount.  Patient denies any pain or discomfort.  Past Medical History:  Diagnosis Date   Anemia    CAD, multiple vessel    s/p CABG   Cataract    bilateral cataract removed   Chicken pox    Diabetes mellitus without complication ([place])    Type 2   GERD (gastroesophageal reflux disease)    Glaucoma    Hypercholesteremia    Hypertension    Myocardial infarction [facility]) 2015   then CABG   Prostate CA ([place]) 2022   "watching it" - no sx planned at this time (09/05/2020)   Tuberculosis 1993   received tx post exposure to person with TB   Urinary tract bacterial infections    Vitamin B12 deficiency    Past Surgical History:  Procedure Laterality Date   CARDIAC CATHETERIZATION     CATARACT EXTRACTION, BILATERAL     CORONARY ARTERY BYPASS GRAFT N/A 08/26/2013   Procedure: CORONARY ARTERY BYPASS GRAFTING (CABG) times five, using left internal mammary artery, right and left greater saphenous vein;  Surgeon: Grace Isaac, MD;  Location: [place];  Service: Open Heart Surgery;  Laterality: N/A;  LIMA-LAD; SEQ SVG-OM1-OM2-OM3; SVG-RCA    INTRAOPERATIVE TRANSESOPHAGEAL ECHOCARDIOGRAM N/A 08/26/2013   Procedure: INTRAOPERATIVE TRANSESOPHAGEAL ECHOCARDIOGRAM;   Surgeon: Grace Isaac, MD;  Location: [place];  Service: Open Heart Surgery;  Laterality: N/A;    Home Medications:  Medications Prior to Admission  Medication Sig Dispense Refill Last Dose   amoxicillin (AMOXIL) 500 MG tablet Take 500 mg by mouth every 6 (six) hours.   11/23/2020   aspirin 81 MG tablet Take 81 mg by mouth daily.   Past Month   cholecalciferol (VITAMIN D3) 25 MCG (1000 UNIT) tablet Take 1,000 Units by mouth daily.   Past Month   losartan (COZAAR) 50 MG tablet Take 1 tablet (50 mg total) by mouth daily. 90 tablet 3 Past Month   metoprolol succinate (TOPROL-XL) 50 MG 24 hr tablet TAKE 1 TABLET DAILY WITH OR IMMEDIATELY FOLLOWING A MEAL 90 tablet 3 Past Month   Multiple Vitamin (MULTIVITAMIN) tablet Take 1 tablet by mouth daily.   Past Month   Netarsudil-Latanoprost (ROCKLATAN) 0.02-0.005 % SOLN Place 1 drop into the left eye at bedtime.   11/23/2020   Omega-3 Fatty Acids (FISH OIL) 1000 MG CAPS Take 1 capsule by mouth at bedtime.   Past Month   rosuvastatin (CRESTOR) 40 MG tablet Take 1 tablet (40 mg total) by mouth daily. 90 tablet 3 Past Month   SIMBRINZA 1-0.2 % SUSP Place 1 drop into both eyes in the morning and at bedtime.   11/24/2020   timolol (TIMOPTIC) 0.5 % ophthalmic solution Place 1 drop into the left eye every morning.   11/24/2020  Allergies: No Known Allergies  Family History  Problem Relation Age of Onset   Diabetes Mother    Alzheimer's disease Mother    Heart disease Father    Diabetes Father    Heart disease Brother    Colon polyps Neg Hx    Colon cancer Neg Hx    Esophageal cancer Neg Hx    Rectal cancer Neg Hx    Stomach cancer Neg Hx    Social History:  reports that he has never smoked. He has been exposed to tobacco smoke. He has never used smokeless tobacco. He reports that he does not drink alcohol and does not use drugs.  ROS: A complete review of systems was performed.  All systems are negative except for pertinent findings as  noted. ROS   Physical Exam:  Vital signs in last 24 hours: Temp:  [98 F (36.7 C)-98.6 F (37 C)] 98.6 F (37 C) (11/11 1315) Pulse Rate:  [63-84] 73 (11/11 1315) Resp:  [13-21] 18 (11/11 1315) BP: (112-131)/(53-73) 121/57 (11/11 1315) SpO2:  [96 %-100 %] 98 % (11/11 1315) Weight:  [81.6 kg] 81.6 kg (11/10 1752) General:  Alert and oriented, No acute distress HEENT: Normocephalic, atraumatic Neck: No JVD or lymphadenopathy Cardiovascular: Regular rate and rhythm Lungs: Regular rate and effort Abdomen: Soft, nontender, nondistended, no abdominal masses Back: No CVA tenderness Extremities: No edema Neurologic: Grossly intact  Laboratory Data:  Results for orders placed or performed during the hospital encounter of 11/24/20 (from the past 24 hour(s))  CBC with Differential     Status: Abnormal   Collection Time: 11/24/20  6:14 PM  Result Value Ref Range   WBC 14.1 (H) 4.0 - 10.5 K/uL   RBC 4.54 4.22 - 5.81 MIL/uL   Hemoglobin 12.8 (L) 13.0 - 17.0 g/dL   HCT 40.0 39.0 - 52.0 %   MCV 88.1 80.0 - 100.0 fL   MCH 28.2 26.0 - 34.0 pg   MCHC 32.0 30.0 - 36.0 g/dL   RDW 14.0 11.5 - 15.5 %   Platelets 158 150 - 400 K/uL   nRBC 0.0 0.0 - 0.2 %   Neutrophils Relative % 74 %   Neutro Abs 10.5 (H) 1.7 - 7.7 K/uL   Lymphocytes Relative 16 %   Lymphs Abs 2.2 0.7 - 4.0 K/uL   Monocytes Relative 8 %   Monocytes Absolute 1.1 (H) 0.1 - 1.0 K/uL   Eosinophils Relative 1 %   Eosinophils Absolute 0.2 0.0 - 0.5 K/uL   Basophils Relative 1 %   Basophils Absolute 0.1 0.0 - 0.1 K/uL   Immature Granulocytes 0 %   Abs Immature Granulocytes 0.05 0.00 - 0.07 K/uL  Basic metabolic panel     Status: Abnormal   Collection Time: 11/24/20  6:14 PM  Result Value Ref Range   Sodium 138 135 - 145 mmol/L   Potassium 3.8 3.5 - 5.1 mmol/L   Chloride 104 98 - 111 mmol/L   CO2 24 22 - 32 mmol/L   Glucose, Bld 245 (H) 70 - 99 mg/dL   BUN 24 (H) 8 - 23 mg/dL   Creatinine, Ser 1.86 (H) 0.61 - 1.24 mg/dL    Calcium 8.6 (L) 8.9 - 10.3 mg/dL   GFR, Estimated 39 (L) >60 mL/min   Anion gap 10 5 - 15  CK     Status: Abnormal   Collection Time: 11/24/20  6:14 PM  Result Value Ref Range   Total CK 410 (H) 49 - 397 U/L  Ethanol     Status: None   Collection Time: 11/24/20  7:50 PM  Result Value Ref Range   Alcohol, Ethyl (B) <10 <10 mg/dL  Urinalysis, Routine w reflex microscopic Urine, Clean Catch     Status: Abnormal   Collection Time: 11/24/20 10:00 PM  Result Value Ref Range   Color, Urine RED (A) YELLOW   APPearance CLOUDY (A) CLEAR   Specific Gravity, Urine >1.046 (H) 1.005 - 1.030   pH 5.0 5.0 - 8.0   Glucose, UA 50 (A) NEGATIVE mg/dL   Hgb urine dipstick MODERATE (A) NEGATIVE   Bilirubin Urine NEGATIVE NEGATIVE   Ketones, ur 5 (A) NEGATIVE mg/dL   Protein, ur 100 (A) NEGATIVE mg/dL   Nitrite NEGATIVE NEGATIVE   Leukocytes,Ua NEGATIVE NEGATIVE   RBC / HPF >50 (H) 0 - 5 RBC/hpf   WBC, UA 6-10 0 - 5 WBC/hpf   Bacteria, UA NONE SEEN NONE SEEN  Glucose, capillary     Status: Abnormal   Collection Time: 11/25/20 12:48 AM  Result Value Ref Range   Glucose-Capillary 159 (H) 70 - 99 mg/dL  CBC     Status: Abnormal   Collection Time: 11/25/20  4:43 AM  Result Value Ref Range   WBC 8.9 4.0 - 10.5 K/uL   RBC 3.52 (L) 4.22 - 5.81 MIL/uL   Hemoglobin 9.9 (L) 13.0 - 17.0 g/dL   HCT 30.6 (L) 39.0 - 52.0 %   MCV 86.9 80.0 - 100.0 fL   MCH 28.1 26.0 - 34.0 pg   MCHC 32.4 30.0 - 36.0 g/dL   RDW 13.8 11.5 - 15.5 %   Platelets 109 (L) 150 - 400 K/uL   nRBC 0.0 0.0 - 0.2 %  Comprehensive metabolic panel     Status: Abnormal   Collection Time: 11/25/20  4:43 AM  Result Value Ref Range   Sodium 141 135 - 145 mmol/L   Potassium 4.3 3.5 - 5.1 mmol/L   Chloride 111 98 - 111 mmol/L   CO2 25 22 - 32 mmol/L   Glucose, Bld 139 (H) 70 - 99 mg/dL   BUN 19 8 - 23 mg/dL   Creatinine, Ser 1.41 (H) 0.61 - 1.24 mg/dL   Calcium 7.8 (L) 8.9 - 10.3 mg/dL   Total Protein 5.4 (L) 6.5 - 8.1 g/dL    Albumin 2.9 (L) 3.5 - 5.0 g/dL   AST 18 15 - 41 U/L   ALT 14 0 - 44 U/L   Alkaline Phosphatase 37 (L) 38 - 126 U/L   Total Bilirubin 0.6 0.3 - 1.2 mg/dL   GFR, Estimated 54 (L) >60 mL/min   Anion gap 5 5 - 15  Hemoglobin and hematocrit, blood     Status: Abnormal   Collection Time: 11/25/20  4:43 AM  Result Value Ref Range   Hemoglobin 10.0 (L) 13.0 - 17.0 g/dL   HCT 30.9 (L) 39.0 - 52.0 %  CK     Status: None   Collection Time: 11/25/20  4:43 AM  Result Value Ref Range   Total CK 261 49 - 397 U/L  Hemoglobin A1c     Status: Abnormal   Collection Time: 11/25/20  4:43 AM  Result Value Ref Range   Hgb A1c MFr Bld 6.8 (H) 4.8 - 5.6 %   Mean Plasma Glucose 148.46 mg/dL  Resp Panel by RT-PCR (Flu A&B, Covid) Nasopharyngeal Swab     Status: None   Collection Time: 11/25/20  5:32 AM   Specimen: Nasopharyngeal  Swab; Nasopharyngeal(NP) swabs in vial transport medium  Result Value Ref Range   SARS Coronavirus 2 by RT PCR NEGATIVE NEGATIVE   Influenza A by PCR NEGATIVE NEGATIVE   Influenza B by PCR NEGATIVE NEGATIVE  Glucose, capillary     Status: None   Collection Time: 11/25/20  9:22 AM  Result Value Ref Range   Glucose-Capillary 88 70 - 99 mg/dL  Glucose, capillary     Status: Abnormal   Collection Time: 11/25/20 11:38 AM  Result Value Ref Range   Glucose-Capillary 67 (L) 70 - 99 mg/dL  Glucose, capillary     Status: Abnormal   Collection Time: 11/25/20 12:12 PM  Result Value Ref Range   Glucose-Capillary 102 (H) 70 - 99 mg/dL  Glucose, capillary     Status: Abnormal   Collection Time: 11/25/20  4:58 PM  Result Value Ref Range   Glucose-Capillary 69 (L) 70 - 99 mg/dL  Glucose, capillary     Status: None   Collection Time: 11/25/20  5:23 PM  Result Value Ref Range   Glucose-Capillary 73 70 - 99 mg/dL   Recent Results (from the past 240 hour(s))  Resp Panel by RT-PCR (Flu A&B, Covid) Nasopharyngeal Swab     Status: None   Collection Time: 11/25/20  5:32 AM   Specimen:  Nasopharyngeal Swab; Nasopharyngeal(NP) swabs in vial transport medium  Result Value Ref Range Status   SARS Coronavirus 2 by RT PCR NEGATIVE NEGATIVE Final    Comment: (NOTE) SARS-CoV-2 target nucleic acids are NOT DETECTED.  The SARS-CoV-2 RNA is generally detectable in upper respiratory specimens during the acute phase of infection. The lowest concentration of SARS-CoV-2 viral copies this assay can detect is 138 copies/mL. A negative result does not preclude SARS-Cov-2 infection and should not be used as the sole basis for treatment or other patient management decisions. A negative result may occur with  improper specimen collection/handling, submission of specimen other than nasopharyngeal swab, presence of viral mutation(s) within the areas targeted by this assay, and inadequate number of viral copies(<138 copies/mL). A negative result must be combined with clinical observations, patient history, and epidemiological information. The expected result is Negative.  Fact Sheet for Patients:  EntrepreneurPulse.com.au  Fact Sheet for Healthcare Providers:  IncredibleEmployment.be  This test is no t yet approved or cleared by the [place] FDA and  has been authorized for detection and/or diagnosis of SARS-CoV-2 by FDA under an Emergency Use Authorization (EUA). This EUA will remain  in effect (meaning this test can be used) for the duration of the COVID-19 declaration under Section 564(b)(1) of the Act, 21 U.S.C.section 360bbb-3(b)(1), unless the authorization is terminated  or revoked sooner.       Influenza A by PCR NEGATIVE NEGATIVE Final   Influenza B by PCR NEGATIVE NEGATIVE Final    Comment: (NOTE) The Xpert Xpress SARS-CoV-2/FLU/RSV plus assay is intended as an aid in the diagnosis of influenza from Nasopharyngeal swab specimens and should not be used as a sole basis for treatment. Nasal washings and aspirates are unacceptable for  Xpert Xpress SARS-CoV-2/FLU/RSV testing.  Fact Sheet for Patients: EntrepreneurPulse.com.au  Fact Sheet for Healthcare Providers: IncredibleEmployment.be  This test is not yet approved or cleared by the [place] FDA and has been authorized for detection and/or diagnosis of SARS-CoV-2 by FDA under an Emergency Use Authorization (EUA). This EUA will remain in effect (meaning this test can be used) for the duration of the COVID-19 declaration under Section 564(b)(1) of the Act, 21  U.S.C. section 360bbb-3(b)(1), unless the authorization is terminated or revoked.  Performed at [facility], [place] [address] [place], [place] [address]    Creatinine: Recent Labs    11/24/20 1814 11/25/20 0443  CREATININE 1.86* 1.41*    Impression/Assessment:  Gross hematuria Rectal bleeding  Plan:  Conservative management by following hemoglobin.  Anticipate bleeding has stopped for the most part and anticipate his hemoglobin stabilizing.  If he has significant rectal bleeding, an option is to insert surgofoam into the rectum.  I brought 1 from my office but he does not seem to needed at this point so I gave it to the nurse.  If he has any significant bleeding this can be lubricated and inserted into the rectum for hemostasis.  Typically, patients can have gross hematuria for a couple of weeks intermittently after prostate biopsy.  They can typically also have blood per rectum for a day or 2.  Sounds like things are clearing up already.  Marton Redwood, III 11/25/2020, 5:29 PM

## 2020-11-25 NOTE — Care Management Obs Status (Signed)
[place] NOTIFICATION   Patient Details  Name: Angel Benson MRN: [id number] Date of Birth: Apr 11, 1950   Medicare Observation Status Notification Given:  Yes    Leeroy Cha, RN 11/25/2020, 11:40 AM

## 2020-11-25 NOTE — TOC Initial Note (Signed)
Transition of Care [facility]) - Initial/Assessment Note    Patient Details  Name: Angel Benson MRN: [id number] Date of Birth: 03-28-1950  Transition of Care [facility]) CM/SW Contact:    Leeroy Cha, RN Phone Number: 11/25/2020, 8:34 AM  Clinical Narrative:                 70 y.o. male with medical history significant for CAD s/p CABG, T2DM, CKD stage IIIb, HTN, HLD, prostate cancer, diverticulosis, vitamin B12 deficiency who presented to the ED for evaluation of hematuria and rectal bleeding.  Patient underwent prostate biopsy earlier today (11/10).  When he returned home he states he had 3 episodes of bleeding from his rectum as well as hematuria when he used the bathroom.  He says the third time he became lightheaded and diaphoretic and nearly passed out.   He says while waiting in the ED he again became lightheaded and diaphoretic and then does not recall what happened afterwards.  This was witnessed in the ED and described as a grand mal seizure-like event with convulsions followed by postictal state.   He says he previously was taking low-dose aspirin 81 mg daily however stopped it 2 weeks ago on his own because of the planned prostate biopsy.  He denies any personal history of seizures.  He denies any subjective fevers, chills, diaphoresis, chest pain, dyspnea, dysuria.   ED Course:  Initial vitals showed BP 147/81, pulse 111, RR 16, temp 97.6 F, SPO2 92% on room air.   Labs show WBC 14.1, hemoglobin 12.8, platelets 158,000, sodium 138, potassium 3.8, bicarb 24, BUN 24, creatinine 1.86 (baseline ~1.7), serum glucose 245, CKD 410, serum ethanol undetectable.   Urinalysis shows negative nitrates, negative leukocytes, >50 RBC/hpf, 6-10 WBC/hpf, no bacteria microscopy.   CT head without contrast is negative for acute intracranial process.   CT abdomen/pelvis with contrast shows nonobstructive 2 mm left nephrolithiasis, tiny hiatal hernia, prostatomegaly, aortic atherosclerosis.   Per  EDP, while in the triage area patient had a witnessed seizure-like episode described as grand mal convulsions, apnea, and postictal state.  Patient was given 1 L normal saline and loaded with IV Keppra 1500 mg.  Case was discussed with neurology who recommended work-up MRI and routine EEG.  The hospitalist service was consulted to admit for further evaluation and management. TOC PLAN OF CARE: Following for toc needs and progression. Expected Discharge Plan: Home/Self Care Barriers to Discharge: Continued Medical Work up   Patient Goals and CMS Choice Patient states their goals for this hospitalization and ongoing recovery are:: to go home CMS Medicare.gov Compare Post Acute Care list provided to:: Patient    Expected Discharge Plan and Services Expected Discharge Plan: Home/Self Care   Discharge Planning Services: CM Consult   Living arrangements for the past 2 months: Single Family Home                                      Prior Living Arrangements/Services Living arrangements for the past 2 months: Single Family Home Lives with:: Self Patient language and need for interpreter reviewed:: Yes Do you feel safe going back to the place where you live?: Yes            Criminal Activity/Legal Involvement Pertinent to Current Situation/Hospitalization: No - Comment as needed  Activities of Daily Living Home Assistive Devices/Equipment: Eyeglasses ADL Screening (condition at time of admission) Patient's cognitive ability adequate  to safely complete daily activities?: Yes Is the patient deaf or have difficulty hearing?: No Does the patient have difficulty seeing, even when wearing glasses/contacts?: Yes Does the patient have difficulty concentrating, remembering, or making decisions?: No Patient able to express need for assistance with ADLs?: Yes Does the patient have difficulty dressing or bathing?: No Independently performs ADLs?: Yes (appropriate for developmental  age) Does the patient have difficulty walking or climbing stairs?: No Weakness of Legs: Both Weakness of Arms/Hands: Both  Permission Sought/Granted                  Emotional Assessment Appearance:: Appears stated age Attitude/Demeanor/Rapport: Engaged Affect (typically observed): Calm Orientation: : Oriented to Self, Oriented to Place, Oriented to  Time, Oriented to Situation Alcohol / Substance Use: Not Applicable Psych Involvement: No (comment)  Admission diagnosis:  Seizure ([place]) [R56.9] Seizure-like activity ([place]) [R56.9] Hematuria, unspecified type [R31.9] Patient Active Problem List   Diagnosis Date Noted   Seizure-like activity ([place]) 11/24/2020   Hypertension associated with diabetes ([place]) 11/24/2020   Elevated CK 11/24/2020   CKD (chronic kidney disease), stage III ([place]) 11/24/2020   Left nephrolithiasis 11/24/2020   Leukocytosis 11/24/2020   B12 deficiency 07/14/2020   Anemia 07/14/2020   Thrombocytopenia ([place]) 07/14/2020   Cardiomyopathy, ischemic 09/27/2014   Routine general medical examination at a health care facility 02/01/2014   S/P CABG x 5 08/26/2013   Hyperlipidemia associated with type 2 diabetes mellitus ([place]) 08/25/2013   Coronary artery disease involving native coronary artery of native heart without angina pectoris 08/25/2013   Benign essential HTN 08/25/2013   Type 2 diabetes mellitus with complication ([place]) 65/03/5463   NSTEMI (non-ST elevated myocardial infarction) ([place]) 08/24/2013   PCP:  [name], [place] Pharmacy:   [facility] - [address], [place] [address] Phone: [phone] Fax: [phone]  [facility] [place], [place] [address] [place] [address] Phone: [phone] Fax: [phone]  [place], [place]  [address] Phone: [phone] Fax: [phone]     Social Determinants of Health (SDOH) Interventions    Readmission Risk Interventions No flowsheet data found.

## 2020-11-26 DIAGNOSIS — I152 Hypertension secondary to endocrine disorders: Secondary | ICD-10-CM

## 2020-11-26 DIAGNOSIS — R710 Precipitous drop in hematocrit: Secondary | ICD-10-CM | POA: Diagnosis not present

## 2020-11-26 DIAGNOSIS — K921 Melena: Secondary | ICD-10-CM

## 2020-11-26 DIAGNOSIS — R569 Unspecified convulsions: Secondary | ICD-10-CM | POA: Diagnosis not present

## 2020-11-26 DIAGNOSIS — N1831 Chronic kidney disease, stage 3a: Secondary | ICD-10-CM | POA: Diagnosis not present

## 2020-11-26 DIAGNOSIS — E1159 Type 2 diabetes mellitus with other circulatory complications: Secondary | ICD-10-CM

## 2020-11-26 DIAGNOSIS — R319 Hematuria, unspecified: Secondary | ICD-10-CM | POA: Diagnosis not present

## 2020-11-26 DIAGNOSIS — D696 Thrombocytopenia, unspecified: Secondary | ICD-10-CM | POA: Diagnosis not present

## 2020-11-26 LAB — BASIC METABOLIC PANEL
Anion gap: 5 (ref 5–15)
BUN: 17 mg/dL (ref 8–23)
CO2: 24 mmol/L (ref 22–32)
Calcium: 8.2 mg/dL — ABNORMAL LOW (ref 8.9–10.3)
Chloride: 110 mmol/L (ref 98–111)
Creatinine, Ser: 1.39 mg/dL — ABNORMAL HIGH (ref 0.61–1.24)
GFR, Estimated: 55 mL/min — ABNORMAL LOW (ref >60)
Glucose, Bld: 108 mg/dL — ABNORMAL HIGH (ref 70–99)
Potassium: 4 mmol/L (ref 3.5–5.1)
Sodium: 139 mmol/L (ref 135–145)

## 2020-11-26 LAB — CBC
HCT: 29.2 % — ABNORMAL LOW (ref 39.0–52.0)
Hemoglobin: 9.3 g/dL — ABNORMAL LOW (ref 13.0–17.0)
MCH: 28.3 pg (ref 26.0–34.0)
MCHC: 31.8 g/dL (ref 30.0–36.0)
MCV: 88.8 fL (ref 80.0–100.0)
Platelets: 103 10*3/uL — ABNORMAL LOW (ref 150–400)
RBC: 3.29 MIL/uL — ABNORMAL LOW (ref 4.22–5.81)
RDW: 14.2 % (ref 11.5–15.5)
WBC: 7.2 10*3/uL (ref 4.0–10.5)
nRBC: 0 % (ref 0.0–0.2)

## 2020-11-26 LAB — GLUCOSE, CAPILLARY
Glucose-Capillary: 111 mg/dL — ABNORMAL HIGH (ref 70–99)
Glucose-Capillary: 103 mg/dL — ABNORMAL HIGH (ref 70–99)

## 2020-11-26 LAB — PROTIME-INR
INR: 1.1 (ref 0.8–1.2)
Prothrombin Time: 14.5 seconds (ref 11.4–15.2)

## 2020-11-26 NOTE — Discharge Summary (Signed)
Physician Discharge Summary  [name]:[id number] DOB: 1950-01-27 DOA: 11/24/2020  PCP: [name], FNP  Admit date: 11/24/2020 Discharge date: 11/26/2020  Admitted From: Home Disposition: Home  Recommendations for Outpatient Follow-up:  Follow up with PCP in 1-2 weeks Please obtain BMP/CBC in one week Follow-up with urology as previously scheduled Cozaar held due to elevated creatinine  Home Health: Equipment/Devices:  Discharge Condition: Stable CODE STATUS: Full code Diet recommendation: Heart healthy  Brief/Interim Summary: 70 year old with a history of hypertension, diabetes, chronic kidney disease stage III, underwent prostate biopsy on 11/10.  Post procedure, he did develop some hematuria as well as blood in his stool.  Reports having about 3 bowel movements containing bright red blood at home.  Subsequently became diaphoretic and had a spell of passing out.  He came to the hospital for evaluation where he was evaluated in the emergency room and had a possible seizure episode.  He is continued to have some blood per rectum, although he reports that his hematuria started to clear.  He is undergoing further work-up for possible seizure with MRI and EEG.  Hemoglobin is being followed.  Discharge Diagnoses:  Principal Problem:   Seizure-like activity ([place]) Active Problems:   Hyperlipidemia associated with type 2 diabetes mellitus ([place])   Coronary artery disease involving native coronary artery of native heart without angina pectoris   Type 2 diabetes mellitus with complication (HCC)   Hypertension associated with diabetes (HCC)   Elevated CK   CKD (chronic kidney disease), stage III (HCC)   Left nephrolithiasis   Leukocytosis   Hematochezia   Decreased hemoglobin  Vasovagal syncope -Patient had syncopal episode at home which was preceded with episodes of diaphoresis and nausea.  These occurred after his prostate biopsy earlier today, after which she  began having bleeding (hematuria and in stool).  He had an unwitnessed episode of syncope at home prior to admission.  A second episode in the emergency room.  It does not appear per records that he had a significant postictal period.  ER episode is described with convulsions which is likely post syncope convulsions.  He did receive a dose of Keppra in the emergency room.  MRI brain and EEG unremarkable.  He did not have any further episodes in the hospital.  No prior history of seizures.  Case reviewed with neurology, Dr. Curly Shores who also agreed that it was unlikely patient truly had seizure and this was more vasovagal syncope.  No further inpatient neuro work-up.  If PCP desires, can refer outpatient neurology for further work-up  Hematuria and rectal bleeding after prostate biopsy (11/10) -Seen by urology, Dr. Gloriann Loan -Reports that overall hematuria appears to be clearing -He did have 1 episode of blood in stool after admission, but no further episodes after that -He was seen by gastroenterology and since blood in stools appear to have resolved, no further intervention was necessary    Acute blood loss anemia -Likely related to blood loss from hematuria/bloody stools -Hemoglobin 12.8 on admission, this is trended down to 9.3 -Likely also has a component of hemodilution -Suspect that his hemoglobin should be stable now, now that bleeding episodes have resolved   Type 2 diabetes -Does not appear to be on chronic medical therapy, likely diet controlled -A1c 6.8    AKI on chronic kidney disease stage IIIa -Initial creatinine noted to be 1.8 on admission -Likely had some degree of volume depletion -Renal function has improved to 1.4 with IV fluids -Continue to hold ARB on  discharge due to elevated creatinine   Thrombocytopenia -Chronic issue, thought to be related to B12 deficiency -Continue to monitor   Hyperlipidemia -Continue on statin   Hypertension -Currently on home dose of  Toprol -And blood pressures have been stable  Discharge Instructions  Discharge Instructions     Diet - low sodium heart healthy   Complete by: As directed    Increase activity slowly   Complete by: As directed       Allergies as of 11/26/2020   No Known Allergies      Medication List     STOP taking these medications    amoxicillin 500 MG tablet Commonly known as: AMOXIL   losartan 50 MG tablet Commonly known as: COZAAR       TAKE these medications    aspirin 81 MG tablet Take 81 mg by mouth daily. Notes to patient: 11/27/2020    cholecalciferol 25 MCG (1000 UNIT) tablet Commonly known as: VITAMIN D3 Take 1,000 Units by mouth daily. Notes to patient: 11/27/2020    Fish Oil 1000 MG Caps Take 1 capsule by mouth at bedtime. Notes to patient: 11/26/2020 bedtime    metoprolol succinate 50 MG 24 hr tablet Commonly known as: TOPROL-XL TAKE 1 TABLET DAILY WITH OR IMMEDIATELY FOLLOWING A MEAL Notes to patient: 11/27/2020    multivitamin tablet Take 1 tablet by mouth daily. Notes to patient: 11/27/2020    Rocklatan 0.02-0.005 % Soln Generic drug: Netarsudil-Latanoprost Place 1 drop into the left eye at bedtime. Notes to patient: 11/26/2020 bedtime    rosuvastatin 40 MG tablet Commonly known as: CRESTOR Take 1 tablet (40 mg total) by mouth daily. Notes to patient: 11/27/2020    Simbrinza 1-0.2 % Susp Generic drug: Brinzolamide-Brimonidine Place 1 drop into both eyes in the morning and at bedtime. Notes to patient: 11/26/2020 bedtime    timolol 0.5 % ophthalmic solution Commonly known as: TIMOPTIC Place 1 drop into the left eye every morning. Notes to patient: 11/27/2020         Follow-up Information     [name], FNP. Schedule an appointment as soon as possible for a visit in 2 week(s).   Specialty: Internal Medicine Contact information: [place] [address] [phone]         [name], MD  Follow up.   Specialty: Urology Why: as scheduled Contact information: [place] [address] [phone]                No Known Allergies  Consultations: Urology GI   Procedures/Studies: CT HEAD WO CONTRAST (5MM)  Result Date: 11/24/2020 CLINICAL DATA:  Seizure, nontraumatic EXAM: CT HEAD WITHOUT CONTRAST TECHNIQUE: Contiguous axial images were obtained from the base of the skull through the vertex without intravenous contrast. COMPARISON:  None. FINDINGS: Brain: No evidence of acute infarction, hemorrhage, cerebral edema, mass, mass effect, or midline shift. No hydrocephalus or extra-axial fluid collection. Vascular: No hyperdense vessel. Skull: Normal. Negative for fracture or focal lesion. Sinuses/Orbits: No acute finding. Status post bilateral lens replacements. Other: The mastoid air cells are well aerated. IMPRESSION: No acute intracranial process. Electronically Signed   By: Merilyn Baba M.D.   On: 11/24/2020 19:29   MR BRAIN WO CONTRAST  Result Date: 11/25/2020 CLINICAL DATA:  Seizure, nontraumatic (Age >= 41y) EXAM: MRI HEAD WITHOUT CONTRAST TECHNIQUE: Multiplanar, multiecho pulse sequences of the brain and surrounding structures were obtained without intravenous contrast. COMPARISON:  CT head November 24, 2020.  FINDINGS: Brain: No acute infarction, hemorrhage, hydrocephalus, extra-axial collection or mass lesion. Minimal T2 hyperintensity within the white matter, within normal limits for patient age. The hippocampi are within normal limits and symmetric in size/signal. Vascular: Major arterial flow voids are maintained at the skull base. Skull and upper cervical spine: Normal marrow signal. Sinuses/Orbits: Minimal paranasal sinus mucosal thickening. Unremarkable orbits. Other: No sizable mastoid effusions. IMPRESSION: No evidence of acute intracranial abnormality. Electronically Signed   By: Margaretha Sheffield M.D.   On: 11/25/2020 09:19   CT ABDOMEN  PELVIS W CONTRAST  Result Date: 11/24/2020 CLINICAL DATA:  Hematuria, unknown cause EXAM: CT ABDOMEN AND PELVIS WITH CONTRAST TECHNIQUE: Multidetector CT imaging of the abdomen and pelvis was performed using the standard protocol following bolus administration of intravenous contrast. CONTRAST:  81mL OMNIPAQUE IOHEXOL 350 MG/ML SOLN COMPARISON:  None. FINDINGS: Lower chest: No acute abnormality.  Tiny hiatal hernia. Hepatobiliary: No focal liver abnormality. No gallstones, gallbladder wall thickening, or pericholecystic fluid. No biliary dilatation. Pancreas: No focal lesion. Normal pancreatic contour. No surrounding inflammatory changes. No main pancreatic ductal dilatation. Spleen: Normal in size without focal abnormality. Splenule noted. Adrenals/Urinary Tract: No adrenal nodule bilaterally. Bilateral kidneys enhance symmetrically. Couple of 15mm calcified stones in the left kidney. No hydronephrosis. No hydroureter. The urinary bladder is unremarkable. On delayed imaging, there is no urothelial wall thickening and there are no filling defects in the opacified portions of the bilateral collecting systems or ureters. Stomach/Bowel: Stomach is within normal limits. No evidence of bowel wall thickening or dilatation. Appendix appears normal. Vascular/Lymphatic: No abdominal aorta or iliac aneurysm. At least moderate atherosclerotic plaque of the aorta and its branches. No abdominal, pelvic, or inguinal lymphadenopathy. Reproductive: The prostate is enlarged measuring up to 5.5 cm. Other: No intraperitoneal free fluid. No intraperitoneal free gas. No organized fluid collection. Musculoskeletal: No abdominal wall hernia or abnormality. No suspicious lytic or blastic osseous lesions. No acute displaced fracture. Multilevel degenerative changes of the spine. IMPRESSION: 1. Nonobstructive 75mm left nephrolithiasis. 2. Tiny hiatal hernia. 3. Prostatomegaly. 4.  Aortic Atherosclerosis (ICD10-I70.0). Electronically Signed    By: Iven Finn M.D.   On: 11/24/2020 19:49   EEG adult  Result Date: 11/25/2020 Lora Havens, MD     11/25/2020  8:11 PM Patient Name: Angel Benson MRN: [id number] Epilepsy Attending: Lora Havens Referring Physician/Provider: Dr Zada Finders Date: 11/25/2020 Duration: 22.53 mins Patient history: 70 year old male with an episode of feeling diaphoretic and subsequently passing out.  EEG evaluate for seizure. Level of alertness: Awake, asleep AEDs during EEG study: None Technical aspects: This EEG study was done with scalp electrodes positioned according to the 10-20 International system of electrode placement. Electrical activity was acquired at a sampling rate of 500Hz  and reviewed with a high frequency filter of 70Hz  and a low frequency filter of 1Hz . EEG data were recorded continuously and digitally stored. Description: The posterior dominant rhythm consists of 8.5 Hz activity of moderate voltage (25-35 uV) seen predominantly in posterior head regions, symmetric and reactive to eye opening and eye closing. Sleep was characterized by vertex waves, sleep spindles (12 to 14 Hz), maximal frontocentral region. Hyperventilation and photic stimulation were not performed.   IMPRESSION: This study is within normal limits. No seizures or epileptiform discharges were seen throughout the recording. Priyanka Barbra Sarks      Subjective: Feels better.  He is not dizzy or lightheaded.  Blood in stools have stopped.  No further hematuria.  Discharge Exam: Vitals:   11/25/20  1113 11/25/20 1315 11/26/20 0515 11/26/20 1332  BP: 113/63 (!) 121/57 121/62 110/67  Pulse: 63 73 62 61  Resp: 20 18 14 18   Temp: 98 F (36.7 C) 98.6 F (37 C) 97.8 F (36.6 C) (!) 97.4 F (36.3 C)  TempSrc: Oral Oral Oral Oral  SpO2:  98% 100% 99%  Weight:      Height:        General: Pt is alert, awake, not in acute distress Cardiovascular: RRR, S1/S2 +, no rubs, no gallops Respiratory: CTA bilaterally, no  wheezing, no rhonchi Abdominal: Soft, NT, ND, bowel sounds + Extremities: no edema, no cyanosis    The results of significant diagnostics from this hospitalization (including imaging, microbiology, ancillary and laboratory) are listed below for reference.     Microbiology: Recent Results (from the past 240 hour(s))  Resp Panel by RT-PCR (Flu A&B, Covid) Nasopharyngeal Swab     Status: None   Collection Time: 11/25/20  5:32 AM   Specimen: Nasopharyngeal Swab; Nasopharyngeal(NP) swabs in vial transport medium  Result Value Ref Range Status   SARS Coronavirus 2 by RT PCR NEGATIVE NEGATIVE Final    Comment: (NOTE) SARS-CoV-2 target nucleic acids are NOT DETECTED.  The SARS-CoV-2 RNA is generally detectable in upper respiratory specimens during the acute phase of infection. The lowest concentration of SARS-CoV-2 viral copies this assay can detect is 138 copies/mL. A negative result does not preclude SARS-Cov-2 infection and should not be used as the sole basis for treatment or other patient management decisions. A negative result may occur with  improper specimen collection/handling, submission of specimen other than nasopharyngeal swab, presence of viral mutation(s) within the areas targeted by this assay, and inadequate number of viral copies(<138 copies/mL). A negative result must be combined with clinical observations, patient history, and epidemiological information. The expected result is Negative.  Fact Sheet for Patients:  EntrepreneurPulse.com.au  Fact Sheet for Healthcare Providers:  IncredibleEmployment.be  This test is no t yet approved or cleared by the [place] FDA and  has been authorized for detection and/or diagnosis of SARS-CoV-2 by FDA under an Emergency Use Authorization (EUA). This EUA will remain  in effect (meaning this test can be used) for the duration of the COVID-19 declaration under Section 564(b)(1) of the Act,  21 U.S.C.section 360bbb-3(b)(1), unless the authorization is terminated  or revoked sooner.       Influenza A by PCR NEGATIVE NEGATIVE Final   Influenza B by PCR NEGATIVE NEGATIVE Final    Comment: (NOTE) The Xpert Xpress SARS-CoV-2/FLU/RSV plus assay is intended as an aid in the diagnosis of influenza from Nasopharyngeal swab specimens and should not be used as a sole basis for treatment. Nasal washings and aspirates are unacceptable for Xpert Xpress SARS-CoV-2/FLU/RSV testing.  Fact Sheet for Patients: EntrepreneurPulse.com.au  Fact Sheet for Healthcare Providers: IncredibleEmployment.be  This test is not yet approved or cleared by the [place] FDA and has been authorized for detection and/or diagnosis of SARS-CoV-2 by FDA under an Emergency Use Authorization (EUA). This EUA will remain in effect (meaning this test can be used) for the duration of the COVID-19 declaration under Section 564(b)(1) of the Act, 21 U.S.C. section 360bbb-3(b)(1), unless the authorization is terminated or revoked.  Performed at [facility], [place] [address], [place], [place] [address]      Labs: BNP (last 3 results) No results for input(s): BNP in the last 8760 hours. Basic Metabolic Panel: Recent Labs  Lab 11/24/20 1814 11/25/20 0443 11/26/20  0532  NA 138 141 139  K 3.8 4.3 4.0  CL 104 111 110  CO2 24 25 24   GLUCOSE 245* 139* 108*  BUN 24* 19 17  CREATININE 1.86* 1.41* 1.39*  CALCIUM 8.6* 7.8* 8.2*   Liver Function Tests: Recent Labs  Lab 11/25/20 0443  AST 18  ALT 14  ALKPHOS 37*  BILITOT 0.6  PROT 5.4*  ALBUMIN 2.9*   No results for input(s): LIPASE, AMYLASE in the last 168 hours. No results for input(s): AMMONIA in the last 168 hours. CBC: Recent Labs  Lab 11/24/20 1814 11/25/20 0443 11/26/20 0532  WBC 14.1* 8.9 7.2  NEUTROABS 10.5*  --   --   HGB 12.8* 9.9*  10.0* 9.3*  HCT 40.0 30.6*  30.9* 29.2*   MCV 88.1 86.9 88.8  PLT 158 109* 103*   Cardiac Enzymes: Recent Labs  Lab 11/24/20 1814 11/25/20 0443  CKTOTAL 410* 261   BNP: Invalid input(s): POCBNP CBG: Recent Labs  Lab 11/25/20 1658 11/25/20 1723 11/25/20 2217 11/26/20 0726 11/26/20 1204  GLUCAP 69* 73 131* 111* 103*   D-Dimer No results for input(s): DDIMER in the last 72 hours. Hgb A1c Recent Labs    11/25/20 0443  HGBA1C 6.8*   Lipid Profile No results for input(s): CHOL, HDL, LDLCALC, TRIG, CHOLHDL, LDLDIRECT in the last 72 hours. Thyroid function studies No results for input(s): TSH, T4TOTAL, T3FREE, THYROIDAB in the last 72 hours.  Invalid input(s): FREET3 Anemia work up No results for input(s): VITAMINB12, FOLATE, FERRITIN, TIBC, IRON, RETICCTPCT in the last 72 hours. Urinalysis    Component Value Date/Time   COLORURINE RED (A) 11/24/2020 2200   APPEARANCEUR CLOUDY (A) 11/24/2020 2200   LABSPEC >1.046 (H) 11/24/2020 2200   PHURINE 5.0 11/24/2020 2200   GLUCOSEU 50 (A) 11/24/2020 2200   GLUCOSEU 100 (A) 06/22/2019 1100   HGBUR MODERATE (A) 11/24/2020 2200   BILIRUBINUR NEGATIVE 11/24/2020 2200   KETONESUR 5 (A) 11/24/2020 2200   PROTEINUR 100 (A) 11/24/2020 2200   UROBILINOGEN 0.2 06/22/2019 1100   NITRITE NEGATIVE 11/24/2020 2200   LEUKOCYTESUR NEGATIVE 11/24/2020 2200   Sepsis Labs Invalid input(s): PROCALCITONIN,  WBC,  LACTICIDVEN Microbiology Recent Results (from the past 240 hour(s))  Resp Panel by RT-PCR (Flu A&B, Covid) Nasopharyngeal Swab     Status: None   Collection Time: 11/25/20  5:32 AM   Specimen: Nasopharyngeal Swab; Nasopharyngeal(NP) swabs in vial transport medium  Result Value Ref Range Status   SARS Coronavirus 2 by RT PCR NEGATIVE NEGATIVE Final    Comment: (NOTE) SARS-CoV-2 target nucleic acids are NOT DETECTED.  The SARS-CoV-2 RNA is generally detectable in upper respiratory specimens during the acute phase of infection. The lowest concentration of SARS-CoV-2  viral copies this assay can detect is 138 copies/mL. A negative result does not preclude SARS-Cov-2 infection and should not be used as the sole basis for treatment or other patient management decisions. A negative result may occur with  improper specimen collection/handling, submission of specimen other than nasopharyngeal swab, presence of viral mutation(s) within the areas targeted by this assay, and inadequate number of viral copies(<138 copies/mL). A negative result must be combined with clinical observations, patient history, and epidemiological information. The expected result is Negative.  Fact Sheet for Patients:  EntrepreneurPulse.com.au  Fact Sheet for Healthcare Providers:  IncredibleEmployment.be  This test is no t yet approved or cleared by the [place] FDA and  has been authorized for detection and/or diagnosis of SARS-CoV-2 by FDA under an  Emergency Use Authorization (EUA). This EUA will remain  in effect (meaning this test can be used) for the duration of the COVID-19 declaration under Section 564(b)(1) of the Act, 21 U.S.C.section 360bbb-3(b)(1), unless the authorization is terminated  or revoked sooner.       Influenza A by PCR NEGATIVE NEGATIVE Final   Influenza B by PCR NEGATIVE NEGATIVE Final    Comment: (NOTE) The Xpert Xpress SARS-CoV-2/FLU/RSV plus assay is intended as an aid in the diagnosis of influenza from Nasopharyngeal swab specimens and should not be used as a sole basis for treatment. Nasal washings and aspirates are unacceptable for Xpert Xpress SARS-CoV-2/FLU/RSV testing.  Fact Sheet for Patients: EntrepreneurPulse.com.au  Fact Sheet for Healthcare Providers: IncredibleEmployment.be  This test is not yet approved or cleared by the [place] FDA and has been authorized for detection and/or diagnosis of SARS-CoV-2 by FDA under an Emergency Use Authorization  (EUA). This EUA will remain in effect (meaning this test can be used) for the duration of the COVID-19 declaration under Section 564(b)(1) of the Act, 21 U.S.C. section 360bbb-3(b)(1), unless the authorization is terminated or revoked.  Performed at [facility], [place] [address], [place], [place] [address]      Time coordinating discharge: 60mins  SIGNED:   Kathie Dike, MD  [facility] 11/26/2020, 7:59 PM   If 7PM-7AM, please contact night-coverage [email or web address]

## 2020-11-26 NOTE — Progress Notes (Signed)
[facility] ROUNDING NOTE   Subjective: No acute events overnight.  No further rectal bleeding.  Tolerating p.o. intake and otherwise offers no complaints.  H/H stable at 9.3/29.4 (from 9.9/30.6).   Objective: Vital signs in last 24 hours: Temp:  [97.8 F (36.6 C)-98.6 F (37 C)] 97.8 F (36.6 C) (11/12 0515) Pulse Rate:  [62-73] 62 (11/12 0515) Resp:  [14-20] 14 (11/12 0515) BP: (113-121)/(57-63) 121/62 (11/12 0515) SpO2:  [98 %-100 %] 100 % (11/12 0515) Last BM Date: 11/25/20 General: NAD Lungs:  CTA b/l, no w/r/r Heart:  RRR, no m/r/g Abdomen:  Soft, NT, ND, +BS Ext:  No c/c/e    Intake/Output from previous day: 11/11 0701 - 11/12 0700 In: 2597.6 [P.O.:960; I.V.:1637.6] Out: 550 [Urine:550] Intake/Output this shift: Total I/O In: 240 [P.O.:240] Out: -    Lab Results: Recent Labs    11/24/20 1814 11/25/20 0443 11/26/20 0532  WBC 14.1* 8.9 7.2  HGB 12.8* 9.9*  10.0* 9.3*  PLT 158 109* 103*  MCV 88.1 86.9 88.8   BMET Recent Labs    11/24/20 1814 11/25/20 0443 11/26/20 0532  NA 138 141 139  K 3.8 4.3 4.0  CL 104 111 110  CO2 24 25 24   GLUCOSE 245* 139* 108*  BUN 24* 19 17  CREATININE 1.86* 1.41* 1.39*  CALCIUM 8.6* 7.8* 8.2*   LFT Recent Labs    11/25/20 0443  PROT 5.4*  ALBUMIN 2.9*  AST 18  ALT 14  ALKPHOS 37*  BILITOT 0.6   PT/INR Recent Labs    11/26/20 0532  INR 1.1      Imaging/Other results: CT HEAD WO CONTRAST (5MM)  Result Date: 11/24/2020 CLINICAL DATA:  Seizure, nontraumatic EXAM: CT HEAD WITHOUT CONTRAST TECHNIQUE: Contiguous axial images were obtained from the base of the skull through the vertex without intravenous contrast. COMPARISON:  None. FINDINGS: Brain: No evidence of acute infarction, hemorrhage, cerebral edema, mass, mass effect, or midline shift. No hydrocephalus or extra-axial fluid collection. Vascular: No hyperdense vessel. Skull: Normal. Negative for fracture or focal lesion. Sinuses/Orbits:  No acute finding. Status post bilateral lens replacements. Other: The mastoid air cells are well aerated. IMPRESSION: No acute intracranial process. Electronically Signed   By: Merilyn Baba M.D.   On: 11/24/2020 19:29   MR BRAIN WO CONTRAST  Result Date: 11/25/2020 CLINICAL DATA:  Seizure, nontraumatic (Age >= 41y) EXAM: MRI HEAD WITHOUT CONTRAST TECHNIQUE: Multiplanar, multiecho pulse sequences of the brain and surrounding structures were obtained without intravenous contrast. COMPARISON:  CT head November 24, 2020. FINDINGS: Brain: No acute infarction, hemorrhage, hydrocephalus, extra-axial collection or mass lesion. Minimal T2 hyperintensity within the white matter, within normal limits for patient age. The hippocampi are within normal limits and symmetric in size/signal. Vascular: Major arterial flow voids are maintained at the skull base. Skull and upper cervical spine: Normal marrow signal. Sinuses/Orbits: Minimal paranasal sinus mucosal thickening. Unremarkable orbits. Other: No sizable mastoid effusions. IMPRESSION: No evidence of acute intracranial abnormality. Electronically Signed   By: Margaretha Sheffield M.D.   On: 11/25/2020 09:19   CT ABDOMEN PELVIS W CONTRAST  Result Date: 11/24/2020 CLINICAL DATA:  Hematuria, unknown cause EXAM: CT ABDOMEN AND PELVIS WITH CONTRAST TECHNIQUE: Multidetector CT imaging of the abdomen and pelvis was performed using the standard protocol following bolus administration of intravenous contrast. CONTRAST:  29mL OMNIPAQUE IOHEXOL 350 MG/ML SOLN COMPARISON:  None. FINDINGS: Lower chest: No acute abnormality.  Tiny hiatal hernia. Hepatobiliary: No focal liver abnormality. No gallstones, gallbladder wall thickening,  or pericholecystic fluid. No biliary dilatation. Pancreas: No focal lesion. Normal pancreatic contour. No surrounding inflammatory changes. No main pancreatic ductal dilatation. Spleen: Normal in size without focal abnormality. Splenule noted.  Adrenals/Urinary Tract: No adrenal nodule bilaterally. Bilateral kidneys enhance symmetrically. Couple of 33mm calcified stones in the left kidney. No hydronephrosis. No hydroureter. The urinary bladder is unremarkable. On delayed imaging, there is no urothelial wall thickening and there are no filling defects in the opacified portions of the bilateral collecting systems or ureters. Stomach/Bowel: Stomach is within normal limits. No evidence of bowel wall thickening or dilatation. Appendix appears normal. Vascular/Lymphatic: No abdominal aorta or iliac aneurysm. At least moderate atherosclerotic plaque of the aorta and its branches. No abdominal, pelvic, or inguinal lymphadenopathy. Reproductive: The prostate is enlarged measuring up to 5.5 cm. Other: No intraperitoneal free fluid. No intraperitoneal free gas. No organized fluid collection. Musculoskeletal: No abdominal wall hernia or abnormality. No suspicious lytic or blastic osseous lesions. No acute displaced fracture. Multilevel degenerative changes of the spine. IMPRESSION: 1. Nonobstructive 22mm left nephrolithiasis. 2. Tiny hiatal hernia. 3. Prostatomegaly. 4.  Aortic Atherosclerosis (ICD10-I70.0). Electronically Signed   By: Iven Finn M.D.   On: 11/24/2020 19:49   EEG adult  Result Date: 11/25/2020 Lora Havens, MD     11/25/2020  8:11 PM Patient Name: Angel Benson MRN: [id number] Epilepsy Attending: Lora Havens Referring Physician/Provider: Dr Zada Finders Date: 11/25/2020 Duration: 22.53 mins Patient history: 70 year old male with an episode of feeling diaphoretic and subsequently passing out.  EEG evaluate for seizure. Level of alertness: Awake, asleep AEDs during EEG study: None Technical aspects: This EEG study was done with scalp electrodes positioned according to the 10-20 International system of electrode placement. Electrical activity was acquired at a sampling rate of 500Hz  and reviewed with a high frequency filter of 70Hz  and  a low frequency filter of 1Hz . EEG data were recorded continuously and digitally stored. Description: The posterior dominant rhythm consists of 8.5 Hz activity of moderate voltage (25-35 uV) seen predominantly in posterior head regions, symmetric and reactive to eye opening and eye closing. Sleep was characterized by vertex waves, sleep spindles (12 to 14 Hz), maximal frontocentral region. Hyperventilation and photic stimulation were not performed.   IMPRESSION: This study is within normal limits. No seizures or epileptiform discharges were seen throughout the recording. Priyanka O Yadav      Assessment and Plan:  1) Hematochezia 2) Acute blood loss anemia 3) Hematuria - 70 year old veteran with painless hematuria and rectal bleeding after transrectal biopsy of prostate.  H/H stabilizing at 9.3/29.4 today from baseline Hgb ~12.5.  Otherwise HD stable - No plan for endoscopic evaluation as the bleeding has essentially resolved - Hematuria management/follow-up per Urology  4) Thrombocytopenia - Reviewed previous labs.  Has undulating PLT ranging 114-179 as an outpatient over the last 18 months or so.  INR normal.  Otherwise normal liver enzymes, albumin as outpatient and no e/o impaired hepatic synthetic function. - Can follow-up in Hematology clinic for further evaluation as outpatient  - GI service will sign off at this time.  Please do not hesitate to contact us with additional questions or concerns.   Lavena Bullion, DO  11/26/2020, 10:47 AM [facility] Pager [phone]

## 2020-11-26 NOTE — Plan of Care (Signed)
  Problem: Education: Goal: Knowledge of General Education information will improve Description: Including pain rating scale, medication(s)/side effects and non-pharmacologic comfort measures Outcome: Progressing   Problem: Clinical Measurements: Goal: Ability to maintain clinical measurements within normal limits will improve Outcome: Progressing   Problem: Nutrition: Goal: Adequate nutrition will be maintained Outcome: Progressing   Problem: Activity: Goal: Risk for activity intolerance will decrease Outcome: Progressing

## 2020-11-28 ENCOUNTER — Telehealth: Payer: Self-pay

## 2020-11-28 NOTE — Telephone Encounter (Signed)
Transition Care Management Unsuccessful Follow-up Telephone Call  Date of discharge and from where:  11/26/2020-[facility]  Attempts:  1st Attempt  Reason for unsuccessful TCM follow-up call:  No answer/busy

## 2020-11-29 NOTE — Telephone Encounter (Signed)
Transition Care Management Follow-up Telephone Call Date of discharge and from where: 11/26/2020-[facility] How have you been since you were released from the hospital? Doing great Any questions or concerns? No  Items Reviewed: Did the pt receive and understand the discharge instructions provided? Yes  Medications obtained and verified? Yes  Other? Yes  Any new allergies since your discharge? No  Dietary orders reviewed? Yes Do you have support at home? Yes   Home Care and Equipment/Supplies: Were home health services ordered? no If so, what is the name of the agency? N/a  Has the agency set up a time to come to the patient's home? not applicable Were any new equipment or medical supplies ordered?  No What is the name of the medical supply agency? N/a Were you able to get the supplies/equipment? not applicable Do you have any questions related to the use of the equipment or supplies? N/a  Functional Questionnaire: (I = Independent and D = Dependent) ADLs: I  Bathing/Dressing- I  Meal Prep- I  Eating- I  Maintaining continence- I  Transferring/Ambulation- I  Managing Meds- I  Follow up appointments reviewed:  PCP Hospital f/u appt confirmed? Yes  Scheduled to see Jodi Mourning on 12/02/2020 @ 3:00. [facility] f/u appt confirmed? Yes  Scheduled to see Dr. Gloriann Loan on 12/02/2020 @ 9:15. Are transportation arrangements needed? No  If their condition worsens, is the pt aware to call PCP or go to the Emergency Dept.? Yes Was the patient provided with contact information for the PCP's office or ED? Yes Was to pt encouraged to call back with questions or concerns? Yes

## 2020-12-02 ENCOUNTER — Other Ambulatory Visit: Payer: Self-pay

## 2020-12-02 ENCOUNTER — Encounter: Payer: Self-pay | Admitting: Family

## 2020-12-02 ENCOUNTER — Ambulatory Visit (INDEPENDENT_AMBULATORY_CARE_PROVIDER_SITE_OTHER): Payer: Medicare Other | Admitting: Family

## 2020-12-02 VITALS — BP 138/50 | HR 74 | Temp 97.7°F | Ht 69.0 in | Wt 180.4 lb

## 2020-12-02 DIAGNOSIS — R55 Syncope and collapse: Secondary | ICD-10-CM | POA: Diagnosis not present

## 2020-12-02 DIAGNOSIS — I10 Essential (primary) hypertension: Secondary | ICD-10-CM | POA: Diagnosis not present

## 2020-12-02 DIAGNOSIS — E119 Type 2 diabetes mellitus without complications: Secondary | ICD-10-CM

## 2020-12-02 NOTE — Progress Notes (Signed)
Angel Benson is a 70 y.o. male with the following history as recorded in EpicCare:  Patient Active Problem List   Diagnosis Date Noted   Hematochezia    Decreased hemoglobin    Seizure-like activity ([place]) 11/24/2020   Hypertension associated with diabetes ([place]) 11/24/2020   Elevated CK 11/24/2020   CKD (chronic kidney disease), stage III ([place]) 11/24/2020   Left nephrolithiasis 11/24/2020   Leukocytosis 11/24/2020   B12 deficiency 07/14/2020   Anemia 07/14/2020   Thrombocytopenia ([place]) 07/14/2020   Cardiomyopathy, ischemic 09/27/2014   Routine general medical examination at a health care facility 02/01/2014   S/P CABG x 5 08/26/2013   Hyperlipidemia associated with type 2 diabetes mellitus ([place]) 08/25/2013   Coronary artery disease involving native coronary artery of native heart without angina pectoris 08/25/2013   Benign essential HTN 08/25/2013   Type 2 diabetes mellitus with complication ([place]) 59/56/3875   NSTEMI (non-ST elevated myocardial infarction) ([place]) 08/24/2013    Current Outpatient Medications  Medication Sig Dispense Refill   aspirin 81 MG tablet Take 81 mg by mouth daily.     cholecalciferol (VITAMIN D3) 25 MCG (1000 UNIT) tablet Take 1,000 Units by mouth daily.     metoprolol succinate (TOPROL-XL) 50 MG 24 hr tablet TAKE 1 TABLET DAILY WITH OR IMMEDIATELY FOLLOWING A MEAL 90 tablet 3   Multiple Vitamin (MULTIVITAMIN) tablet Take 1 tablet by mouth daily.     Netarsudil-Latanoprost (ROCKLATAN) 0.02-0.005 % SOLN Place 1 drop into the left eye at bedtime.     Omega-3 Fatty Acids (FISH OIL) 1000 MG CAPS Take 1 capsule by mouth at bedtime.     rosuvastatin (CRESTOR) 40 MG tablet Take 1 tablet (40 mg total) by mouth daily. 90 tablet 3   SIMBRINZA 1-0.2 % SUSP Place 1 drop into both eyes in the morning and at bedtime.     timolol (TIMOPTIC) 0.5 % ophthalmic solution Place 1 drop into the left eye every morning.     No current facility-administered medications for this  visit.    Allergies: Patient has no known allergies.  Past Medical History:  Diagnosis Date   Anemia    CAD, multiple vessel    s/p CABG   Cataract    bilateral cataract removed   Chicken pox    Diabetes mellitus without complication ([place])    Type 2   GERD (gastroesophageal reflux disease)    Glaucoma    Hypercholesteremia    Hypertension    Myocardial infarction [facility]) 2015   then CABG   Prostate CA ([place]) 2022   "watching it" - no sx planned at this time (09/05/2020)   Tuberculosis 1993   received tx post exposure to person with TB   Urinary tract bacterial infections    Vitamin B12 deficiency     Past Surgical History:  Procedure Laterality Date   CARDIAC CATHETERIZATION     CATARACT EXTRACTION, BILATERAL     CORONARY ARTERY BYPASS GRAFT N/A 08/26/2013   Procedure: CORONARY ARTERY BYPASS GRAFTING (CABG) times five, using left internal mammary artery, right and left greater saphenous vein;  Surgeon: Grace Isaac, MD;  Location: [facility];  Service: Open Heart Surgery;  Laterality: N/A;  LIMA-LAD; SEQ SVG-OM1-OM2-OM3; SVG-RCA    INTRAOPERATIVE TRANSESOPHAGEAL ECHOCARDIOGRAM N/A 08/26/2013   Procedure: INTRAOPERATIVE TRANSESOPHAGEAL ECHOCARDIOGRAM;  Surgeon: Grace Isaac, MD;  Location: [facility];  Service: Open Heart Surgery;  Laterality: N/A;    Family History  Problem Relation Age of Onset   Diabetes Mother  Alzheimer's disease Mother    Heart disease Father    Diabetes Father    Heart disease Brother    Colon polyps Neg Hx    Colon cancer Neg Hx    Esophageal cancer Neg Hx    Rectal cancer Neg Hx    Stomach cancer Neg Hx     Social History   Tobacco Use   Smoking status: Never    Passive exposure: Past   Smokeless tobacco: Never  Substance Use Topics   Alcohol use: No    Subjective:  Patient is seen for hospital follow up; was hospitalized from 68/12-75/17 with complications from prostate biopsy; excessive bleeding and suspected vasovagal syncope;  neurology was consulted who did not feel she had a seizure; Patient is feeling better- has seen urology earlier today who has cleared him for another 6 months;   Is no longer taking Losartan- it was stopped in hospital due to AKI; Hgba1c was checked in hospital at 6.8;    Objective:  Vitals:   12/02/20 1506  BP: (!) 138/50  Pulse: 74  Temp: 97.7 F (36.5 C)  TempSrc: Oral  SpO2: 99%  Weight: 180 lb 6.4 oz (81.8 kg)  Height: 5' 9"  (1.753 m)    General: Well developed, well nourished, in no acute distress  Skin : Warm and dry.  Head: Normocephalic and atraumatic  Lungs: Respirations unlabored; clear to auscultation bilaterally without wheeze, rales, rhonchi  CVS exam: normal rate and regular rhythm.  Neurologic: Alert and oriented; speech intact; face symmetrical; moves all extremities well; CNII-XII intact without focal deficit   Assessment:  1. Syncope, unspecified syncope type   2. Benign essential HTN     Plan:  Improved; will update labs today; follow up in 6 months, sooner prn.  2.   Okay to hold Losartan for now;  3.   Stable; diet controlled- excellent Hgba1c;   30 minutes spent with patient reviewing notes from hospital/ urology notes;   This visit occurred during the SARS-CoV-2 public health emergency.  Safety protocols were in place, including screening questions prior to the visit, additional usage of staff PPE, and extensive cleaning of exam room while observing appropriate contact time as indicated for disinfecting solutions.     Return in about 6 months (around 06/01/2021).  Orders Placed This Encounter  Procedures   CBC with Differential/Platelet   Comp Met (CMET)    Requested Prescriptions    No prescriptions requested or ordered in this encounter

## 2020-12-03 LAB — COMPREHENSIVE METABOLIC PANEL
AG Ratio: 1.6 (calc) (ref 1.0–2.5)
ALT: 18 U/L (ref 9–46)
AST: 17 U/L (ref 10–35)
Albumin: 3.9 g/dL (ref 3.6–5.1)
Alkaline phosphatase (APISO): 50 U/L (ref 35–144)
BUN/Creatinine Ratio: 13 (calc) (ref 6–22)
BUN: 21 mg/dL (ref 7–25)
CO2: 30 mmol/L (ref 20–32)
Calcium: 8.9 mg/dL (ref 8.6–10.3)
Chloride: 105 mmol/L (ref 98–110)
Creat: 1.66 mg/dL — ABNORMAL HIGH (ref 0.70–1.35)
Globulin: 2.5 g/dL (calc) (ref 1.9–3.7)
Glucose, Bld: 189 mg/dL — ABNORMAL HIGH (ref 65–99)
Potassium: 4.5 mmol/L (ref 3.5–5.3)
Sodium: 141 mmol/L (ref 135–146)
Total Bilirubin: 0.3 mg/dL (ref 0.2–1.2)
Total Protein: 6.4 g/dL (ref 6.1–8.1)

## 2020-12-03 LAB — CBC WITH DIFFERENTIAL/PLATELET
Absolute Monocytes: 864 cells/uL (ref 200–950)
Basophils Absolute: 72 cells/uL (ref 0–200)
Basophils Relative: 0.9 %
Eosinophils Absolute: 328 cells/uL (ref 15–500)
Eosinophils Relative: 4.1 %
HCT: 32.3 % — ABNORMAL LOW (ref 38.5–50.0)
Hemoglobin: 10.5 g/dL — ABNORMAL LOW (ref 13.2–17.1)
Lymphs Abs: 2080 cells/uL (ref 850–3900)
MCH: 28.3 pg (ref 27.0–33.0)
MCHC: 32.5 g/dL (ref 32.0–36.0)
MCV: 87.1 fL (ref 80.0–100.0)
MPV: 12.1 fL (ref 7.5–12.5)
Monocytes Relative: 10.8 %
Neutro Abs: 4656 cells/uL (ref 1500–7800)
Neutrophils Relative %: 58.2 %
Platelets: 156 10*3/uL (ref 140–400)
RBC: 3.71 10*6/uL — ABNORMAL LOW (ref 4.20–5.80)
RDW: 13.1 % (ref 11.0–15.0)
Total Lymphocyte: 26 %
WBC: 8 10*3/uL (ref 3.8–10.8)

## 2020-12-12 ENCOUNTER — Inpatient Hospital Stay: Payer: Medicare Other | Attending: Physician Assistant | Admitting: Internal Medicine

## 2020-12-12 ENCOUNTER — Other Ambulatory Visit: Payer: Self-pay

## 2020-12-12 ENCOUNTER — Inpatient Hospital Stay: Payer: Medicare Other

## 2020-12-12 VITALS — BP 150/82 | HR 68 | Temp 97.1°F | Resp 18 | Ht 69.0 in | Wt 180.3 lb

## 2020-12-12 DIAGNOSIS — Z79899 Other long term (current) drug therapy: Secondary | ICD-10-CM | POA: Insufficient documentation

## 2020-12-12 DIAGNOSIS — D539 Nutritional anemia, unspecified: Secondary | ICD-10-CM

## 2020-12-12 DIAGNOSIS — D6959 Other secondary thrombocytopenia: Secondary | ICD-10-CM | POA: Insufficient documentation

## 2020-12-12 DIAGNOSIS — E538 Deficiency of other specified B group vitamins: Secondary | ICD-10-CM | POA: Insufficient documentation

## 2020-12-12 DIAGNOSIS — Z7982 Long term (current) use of aspirin: Secondary | ICD-10-CM | POA: Diagnosis not present

## 2020-12-12 DIAGNOSIS — D649 Anemia, unspecified: Secondary | ICD-10-CM | POA: Insufficient documentation

## 2020-12-12 DIAGNOSIS — Z8546 Personal history of malignant neoplasm of prostate: Secondary | ICD-10-CM | POA: Diagnosis not present

## 2020-12-12 LAB — CBC WITH DIFFERENTIAL (CANCER CENTER ONLY)
Abs Immature Granulocytes: 0.02 10*3/uL (ref 0.00–0.07)
Basophils Absolute: 0.1 10*3/uL (ref 0.0–0.1)
Basophils Relative: 1 %
Eosinophils Absolute: 0.3 10*3/uL (ref 0.0–0.5)
Eosinophils Relative: 4 %
HCT: 37 % — ABNORMAL LOW (ref 39.0–52.0)
Hemoglobin: 11.4 g/dL — ABNORMAL LOW (ref 13.0–17.0)
Immature Granulocytes: 0 %
Lymphocytes Relative: 28 %
Lymphs Abs: 2.2 10*3/uL (ref 0.7–4.0)
MCH: 28.1 pg (ref 26.0–34.0)
MCHC: 30.8 g/dL (ref 30.0–36.0)
MCV: 91.4 fL (ref 80.0–100.0)
Monocytes Absolute: 0.6 10*3/uL (ref 0.1–1.0)
Monocytes Relative: 8 %
Neutro Abs: 4.5 10*3/uL (ref 1.7–7.7)
Neutrophils Relative %: 59 %
Platelet Count: 169 10*3/uL (ref 150–400)
RBC: 4.05 MIL/uL — ABNORMAL LOW (ref 4.22–5.81)
RDW: 15.8 % — ABNORMAL HIGH (ref 11.5–15.5)
WBC Count: 7.7 10*3/uL (ref 4.0–10.5)
nRBC: 0 % (ref 0.0–0.2)

## 2020-12-12 LAB — FOLATE: Folate: 30.6 ng/mL (ref >5.9)

## 2020-12-12 LAB — FERRITIN: Ferritin: 106 ng/mL (ref 24–336)

## 2020-12-12 LAB — IRON AND TIBC
Iron: 66 ug/dL (ref 42–163)
Saturation Ratios: 22 % (ref 20–55)
TIBC: 305 ug/dL (ref 202–409)
UIBC: 239 ug/dL (ref 117–376)

## 2020-12-12 LAB — VITAMIN B12: Vitamin B-12: 408 pg/mL (ref 180–914)

## 2020-12-12 NOTE — Progress Notes (Signed)
[facility] Telephone:(336) [phone]   Fax:(336) [phone]  OFFICE PROGRESS NOTE  [name], FNP [address] [place] [address]  DIAGNOSIS: Anemia and thrombocytopenia secondary to vitamin B12 deficiency  PRIOR THERAPY: None   CURRENT THERAPY: Vitamin B12 injection weekly for 4 weeks, then monthly.  INTERVAL HISTORY: Angel Benson 70 y.o. male returns to the clinic today for follow-up visit.  The patient is feeling fine today with no concerning complaints.  He was admitted to the hospital few weeks ago after prostate biopsy and significant hematuria with syncopal episode and seizure-like activity.  He had MRI of the brain that was unremarkable.  The patient denied having any current chest pain, shortness of breath, cough or hemoptysis.  He denied having any fever or chills.  He has no nausea, vomiting, diarrhea or constipation.  He has no headache or visual changes.  He is here today for evaluation and repeat blood work.   MEDICAL HISTORY: Past Medical History:  Diagnosis Date   Anemia    CAD, multiple vessel    s/p CABG   Cataract    bilateral cataract removed   Chicken pox    Diabetes mellitus without complication ([facility])    Type 2   GERD (gastroesophageal reflux disease)    Glaucoma    Hypercholesteremia    Hypertension    Myocardial infarction ([facility]) 2015   then CABG   Prostate CA ([place]) 2022   "watching it" - no sx planned at this time (09/05/2020)   Tuberculosis 1993   received tx post exposure to person with TB   Urinary tract bacterial infections    Vitamin B12 deficiency     ALLERGIES:  has No Known Allergies.  MEDICATIONS:  Current Outpatient Medications  Medication Sig Dispense Refill   aspirin 81 MG tablet Take 81 mg by mouth daily.     cholecalciferol (VITAMIN D3) 25 MCG (1000 UNIT) tablet Take 1,000 Units by mouth daily.     metoprolol succinate (TOPROL-XL) 50 MG 24 hr tablet TAKE 1 TABLET DAILY WITH  OR IMMEDIATELY FOLLOWING A MEAL 90 tablet 3   Multiple Vitamin (MULTIVITAMIN) tablet Take 1 tablet by mouth daily.     Netarsudil-Latanoprost (ROCKLATAN) 0.02-0.005 % SOLN Place 1 drop into the left eye at bedtime.     Omega-3 Fatty Acids (FISH OIL) 1000 MG CAPS Take 1 capsule by mouth at bedtime.     rosuvastatin (CRESTOR) 40 MG tablet Take 1 tablet (40 mg total) by mouth daily. 90 tablet 3   SIMBRINZA 1-0.2 % SUSP Place 1 drop into both eyes in the morning and at bedtime.     timolol (TIMOPTIC) 0.5 % ophthalmic solution Place 1 drop into the left eye every morning.     No current facility-administered medications for this visit.    SURGICAL HISTORY:  Past Surgical History:  Procedure Laterality Date   CARDIAC CATHETERIZATION     CATARACT EXTRACTION, BILATERAL     CORONARY ARTERY BYPASS GRAFT N/A 08/26/2013   Procedure: CORONARY ARTERY BYPASS GRAFTING (CABG) times five, using left internal mammary artery, right and left greater saphenous vein;  Surgeon: Grace Isaac, MD;  Location: [facility];  Service: Open Heart Surgery;  Laterality: N/A;  LIMA-LAD; SEQ SVG-OM1-OM2-OM3; SVG-RCA    INTRAOPERATIVE TRANSESOPHAGEAL ECHOCARDIOGRAM N/A 08/26/2013   Procedure: INTRAOPERATIVE TRANSESOPHAGEAL ECHOCARDIOGRAM;  Surgeon: Grace Isaac, MD;  Location: [facility];  Service: Open Heart Surgery;  Laterality: N/A;  REVIEW OF SYSTEMS:  A comprehensive review of systems was negative.   PHYSICAL EXAMINATION: General appearance: alert, cooperative, and no distress Head: Normocephalic, without obvious abnormality, atraumatic Neck: no adenopathy, no JVD, supple, symmetrical, trachea midline, and thyroid not enlarged, symmetric, no tenderness/mass/nodules Lymph nodes: Cervical, supraclavicular, and axillary nodes normal. Resp: clear to auscultation bilaterally Back: symmetric, no curvature. ROM normal. No CVA tenderness. Cardio: regular rate and rhythm, S1, S2 normal, no murmur, click, rub or gallop GI:  soft, non-tender; bowel sounds normal; no masses,  no organomegaly Extremities: extremities normal, atraumatic, no cyanosis or edema  ECOG PERFORMANCE STATUS: 1 - Symptomatic but completely ambulatory  Blood pressure (!) 150/82, pulse 68, temperature (!) 97.1 F (36.2 C), temperature source Tympanic, resp. rate 18, height 5\' 9"  (1.753 m), weight 180 lb 4.8 oz (81.8 kg), SpO2 100 %.  LABORATORY DATA: Lab Results  Component Value Date   WBC 7.7 12/12/2020   HGB 11.4 (L) 12/12/2020   HCT 37.0 (L) 12/12/2020   MCV 91.4 12/12/2020   PLT 169 12/12/2020      Chemistry      Component Value Date/Time   NA 141 12/02/2020 1550   K 4.5 12/02/2020 1550   CL 105 12/02/2020 1550   CO2 30 12/02/2020 1550   BUN 21 12/02/2020 1550   CREATININE 1.66 (H) 12/02/2020 1550      Component Value Date/Time   CALCIUM 8.9 12/02/2020 1550   ALKPHOS 37 (L) 11/25/2020 0443   AST 17 12/02/2020 1550   AST 19 08/15/2020 1337   ALT 18 12/02/2020 1550   ALT 19 08/15/2020 1337   BILITOT 0.3 12/02/2020 1550   BILITOT 0.5 08/15/2020 1337       RADIOGRAPHIC STUDIES: CT HEAD WO CONTRAST (5MM)  Result Date: 11/24/2020 CLINICAL DATA:  Seizure, nontraumatic EXAM: CT HEAD WITHOUT CONTRAST TECHNIQUE: Contiguous axial images were obtained from the base of the skull through the vertex without intravenous contrast. COMPARISON:  None. FINDINGS: Brain: No evidence of acute infarction, hemorrhage, cerebral edema, mass, mass effect, or midline shift. No hydrocephalus or extra-axial fluid collection. Vascular: No hyperdense vessel. Skull: Normal. Negative for fracture or focal lesion. Sinuses/Orbits: No acute finding. Status post bilateral lens replacements. Other: The mastoid air cells are well aerated. IMPRESSION: No acute intracranial process. Electronically Signed   By: Merilyn Baba M.D.   On: 11/24/2020 19:29   MR BRAIN WO CONTRAST  Result Date: 11/25/2020 CLINICAL DATA:  Seizure, nontraumatic (Age >= 41y) EXAM:  MRI HEAD WITHOUT CONTRAST TECHNIQUE: Multiplanar, multiecho pulse sequences of the brain and surrounding structures were obtained without intravenous contrast. COMPARISON:  CT head November 24, 2020. FINDINGS: Brain: No acute infarction, hemorrhage, hydrocephalus, extra-axial collection or mass lesion. Minimal T2 hyperintensity within the white matter, within normal limits for patient age. The hippocampi are within normal limits and symmetric in size/signal. Vascular: Major arterial flow voids are maintained at the skull base. Skull and upper cervical spine: Normal marrow signal. Sinuses/Orbits: Minimal paranasal sinus mucosal thickening. Unremarkable orbits. Other: No sizable mastoid effusions. IMPRESSION: No evidence of acute intracranial abnormality. Electronically Signed   By: Margaretha Sheffield M.D.   On: 11/25/2020 09:19   CT ABDOMEN PELVIS W CONTRAST  Result Date: 11/24/2020 CLINICAL DATA:  Hematuria, unknown cause EXAM: CT ABDOMEN AND PELVIS WITH CONTRAST TECHNIQUE: Multidetector CT imaging of the abdomen and pelvis was performed using the standard protocol following bolus administration of intravenous contrast. CONTRAST:  61mL OMNIPAQUE IOHEXOL 350 MG/ML SOLN COMPARISON:  None. FINDINGS: Lower chest:  No acute abnormality.  Tiny hiatal hernia. Hepatobiliary: No focal liver abnormality. No gallstones, gallbladder wall thickening, or pericholecystic fluid. No biliary dilatation. Pancreas: No focal lesion. Normal pancreatic contour. No surrounding inflammatory changes. No main pancreatic ductal dilatation. Spleen: Normal in size without focal abnormality. Splenule noted. Adrenals/Urinary Tract: No adrenal nodule bilaterally. Bilateral kidneys enhance symmetrically. Couple of 60mm calcified stones in the left kidney. No hydronephrosis. No hydroureter. The urinary bladder is unremarkable. On delayed imaging, there is no urothelial wall thickening and there are no filling defects in the opacified portions of  the bilateral collecting systems or ureters. Stomach/Bowel: Stomach is within normal limits. No evidence of bowel wall thickening or dilatation. Appendix appears normal. Vascular/Lymphatic: No abdominal aorta or iliac aneurysm. At least moderate atherosclerotic plaque of the aorta and its branches. No abdominal, pelvic, or inguinal lymphadenopathy. Reproductive: The prostate is enlarged measuring up to 5.5 cm. Other: No intraperitoneal free fluid. No intraperitoneal free gas. No organized fluid collection. Musculoskeletal: No abdominal wall hernia or abnormality. No suspicious lytic or blastic osseous lesions. No acute displaced fracture. Multilevel degenerative changes of the spine. IMPRESSION: 1. Nonobstructive 65mm left nephrolithiasis. 2. Tiny hiatal hernia. 3. Prostatomegaly. 4.  Aortic Atherosclerosis (ICD10-I70.0). Electronically Signed   By: Iven Finn M.D.   On: 11/24/2020 19:49   EEG adult  Result Date: 11/25/2020 Lora Havens, MD     11/25/2020  8:11 PM Patient Name: Suren Payne MRN: [id number] Epilepsy Attending: Lora Havens Referring Physician/Provider: Dr Zada Finders Date: 11/25/2020 Duration: 22.53 mins Patient history: 70 year old male with an episode of feeling diaphoretic and subsequently passing out.  EEG evaluate for seizure. Level of alertness: Awake, asleep AEDs during EEG study: None Technical aspects: This EEG study was done with scalp electrodes positioned according to the 10-20 International system of electrode placement. Electrical activity was acquired at a sampling rate of 500Hz  and reviewed with a high frequency filter of 70Hz  and a low frequency filter of 1Hz . EEG data were recorded continuously and digitally stored. Description: The posterior dominant rhythm consists of 8.5 Hz activity of moderate voltage (25-35 uV) seen predominantly in posterior head regions, symmetric and reactive to eye opening and eye closing. Sleep was characterized by vertex waves, sleep  spindles (12 to 14 Hz), maximal frontocentral region. Hyperventilation and photic stimulation were not performed.   IMPRESSION: This study is within normal limits. No seizures or epileptiform discharges were seen throughout the recording. Priyanka Barbra Sarks    ASSESSMENT AND PLAN: This is a very pleasant 70 years old African-American male with anemia and thrombocytopenia likely secondary to vitamin B12 deficiency. The patient had extensive blood work for evaluation of his condition and these were unremarkable except for the vitamin B12 deficiency. He was started on treatment with vitamin B12 1000 mcg intramuscular weekly for 3 weeks and starting from today he will be on monthly treatment.  His last vitamin B12 injection was more than a month ago.  The patient is feeling fine today with no concerning complaints.  Repeat CBC today showed persistent mild anemia but significantly improved compared to few months ago. Iron study, ferritin, vitamin E42 and folic acid are still pending. I recommended for the patient to start oral vitamin B 12 supplement but I will also consider him for injection if he has persistent vitamin B12 deficiency. He will come back for follow-up visit in 3 months for evaluation and repeat blood work. He was advised to call immediately if he has any other concerning symptoms in  the interval. The patient voices understanding of current disease status and treatment options and is in agreement with the current care plan.  All questions were answered. The patient knows to call the clinic with any problems, questions or concerns. We can certainly see the patient much sooner if necessary.  The total time spent in the appointment was 20 minutes.  Disclaimer: This note was dictated with voice recognition software. Similar sounding words can inadvertently be transcribed and may not be corrected upon review.

## 2021-01-17 ENCOUNTER — Ambulatory Visit: Payer: Medicare Other | Admitting: Family

## 2021-03-14 ENCOUNTER — Inpatient Hospital Stay (HOSPITAL_BASED_OUTPATIENT_CLINIC_OR_DEPARTMENT_OTHER): Payer: Medicare Other | Admitting: Internal Medicine

## 2021-03-14 ENCOUNTER — Inpatient Hospital Stay: Payer: Medicare Other | Attending: Physician Assistant

## 2021-03-14 ENCOUNTER — Other Ambulatory Visit: Payer: Self-pay

## 2021-03-14 VITALS — BP 158/79 | HR 60 | Temp 97.4°F | Resp 19 | Ht 69.0 in | Wt 187.9 lb

## 2021-03-14 DIAGNOSIS — E538 Deficiency of other specified B group vitamins: Secondary | ICD-10-CM

## 2021-03-14 DIAGNOSIS — I252 Old myocardial infarction: Secondary | ICD-10-CM | POA: Diagnosis not present

## 2021-03-14 DIAGNOSIS — Z8546 Personal history of malignant neoplasm of prostate: Secondary | ICD-10-CM | POA: Diagnosis not present

## 2021-03-14 DIAGNOSIS — D696 Thrombocytopenia, unspecified: Secondary | ICD-10-CM | POA: Insufficient documentation

## 2021-03-14 DIAGNOSIS — D649 Anemia, unspecified: Secondary | ICD-10-CM | POA: Insufficient documentation

## 2021-03-14 DIAGNOSIS — D539 Nutritional anemia, unspecified: Secondary | ICD-10-CM

## 2021-03-14 LAB — CBC WITH DIFFERENTIAL (CANCER CENTER ONLY)
Abs Immature Granulocytes: 0.01 10*3/uL (ref 0.00–0.07)
Basophils Absolute: 0.1 10*3/uL (ref 0.0–0.1)
Basophils Relative: 1 %
Eosinophils Absolute: 0.3 10*3/uL (ref 0.0–0.5)
Eosinophils Relative: 4 %
HCT: 44.5 % (ref 39.0–52.0)
Hemoglobin: 14.3 g/dL (ref 13.0–17.0)
Immature Granulocytes: 0 %
Lymphocytes Relative: 27 %
Lymphs Abs: 2.1 10*3/uL (ref 0.7–4.0)
MCH: 27.2 pg (ref 26.0–34.0)
MCHC: 32.1 g/dL (ref 30.0–36.0)
MCV: 84.6 fL (ref 80.0–100.0)
Monocytes Absolute: 0.7 10*3/uL (ref 0.1–1.0)
Monocytes Relative: 9 %
Neutro Abs: 4.8 10*3/uL (ref 1.7–7.7)
Neutrophils Relative %: 59 %
Platelet Count: 144 10*3/uL — ABNORMAL LOW (ref 150–400)
RBC: 5.26 MIL/uL (ref 4.22–5.81)
RDW: 13.7 % (ref 11.5–15.5)
WBC Count: 8 10*3/uL (ref 4.0–10.5)
nRBC: 0 % (ref 0.0–0.2)

## 2021-03-14 LAB — VITAMIN B12: Vitamin B-12: 908 pg/mL (ref 180–914)

## 2021-03-14 LAB — IRON AND IRON BINDING CAPACITY (CC-WL,HP ONLY)
Iron: 83 ug/dL (ref 45–182)
Saturation Ratios: 23 % (ref 17.9–39.5)
TIBC: 356 ug/dL (ref 250–450)
UIBC: 273 ug/dL (ref 117–376)

## 2021-03-14 LAB — FOLATE: Folate: 36.9 ng/mL (ref >5.9)

## 2021-03-14 LAB — FERRITIN: Ferritin: 46 ng/mL (ref 24–336)

## 2021-03-14 NOTE — Progress Notes (Signed)
[facility] Telephone:(336) [phone]   Fax:([phone]  OFFICE PROGRESS NOTE  [name], FNP [address] [place] [address]  DIAGNOSIS: Anemia and thrombocytopenia secondary to vitamin B12 deficiency  PRIOR THERAPY: Vitamin B12 injection weekly for 4 weeks, then monthly.   CURRENT THERAPY: Oral vitamin B12 supplement  INTERVAL HISTORY: Angel Benson 71 y.o. male returns to the clinic today for follow-up visit.  The patient is feeling fine today with no concerning complaints.  He denied having any fatigue or weakness.  He denied having any chest pain, shortness of breath, cough or hemoptysis.  He has no nausea, vomiting, diarrhea or constipation.  He denied having any headache or visual changes.  He has no weight loss or night sweats.  He continues to tolerate the oral vitamin B12 supplement fairly well.  He is here today for evaluation and repeat blood work.   MEDICAL HISTORY: Past Medical History:  Diagnosis Date   Anemia    CAD, multiple vessel    s/p CABG   Cataract    bilateral cataract removed   Chicken pox    Diabetes mellitus without complication ([facility])    Type 2   GERD (gastroesophageal reflux disease)    Glaucoma    Hypercholesteremia    Hypertension    Myocardial infarction ([facility]) 2015   then CABG   Prostate CA ([facility]) 2022   "watching it" - no sx planned at this time (09/05/2020)   Tuberculosis 1993   received tx post exposure to person with TB   Urinary tract bacterial infections    Vitamin B12 deficiency     ALLERGIES:  has No Known Allergies.  MEDICATIONS:  Current Outpatient Medications  Medication Sig Dispense Refill   aspirin 81 MG tablet Take 81 mg by mouth daily.     cholecalciferol (VITAMIN D3) 25 MCG (1000 UNIT) tablet Take 1,000 Units by mouth daily.     metoprolol succinate (TOPROL-XL) 50 MG 24 hr tablet TAKE 1 TABLET DAILY WITH OR IMMEDIATELY FOLLOWING A MEAL 90 tablet 3   Multiple  Vitamin (MULTIVITAMIN) tablet Take 1 tablet by mouth daily.     Netarsudil-Latanoprost (ROCKLATAN) 0.02-0.005 % SOLN Place 1 drop into the left eye at bedtime.     Omega-3 Fatty Acids (FISH OIL) 1000 MG CAPS Take 1 capsule by mouth at bedtime.     rosuvastatin (CRESTOR) 40 MG tablet Take 1 tablet (40 mg total) by mouth daily. 90 tablet 3   SIMBRINZA 1-0.2 % SUSP Place 1 drop into both eyes in the morning and at bedtime.     timolol (TIMOPTIC) 0.5 % ophthalmic solution Place 1 drop into the left eye every morning.     No current facility-administered medications for this visit.    SURGICAL HISTORY:  Past Surgical History:  Procedure Laterality Date   CARDIAC CATHETERIZATION     CATARACT EXTRACTION, BILATERAL     CORONARY ARTERY BYPASS GRAFT N/A 08/26/2013   Procedure: CORONARY ARTERY BYPASS GRAFTING (CABG) times five, using left internal mammary artery, right and left greater saphenous vein;  Surgeon: Grace Isaac, MD;  Location: [facility];  Service: Open Heart Surgery;  Laterality: N/A;  LIMA-LAD; SEQ SVG-OM1-OM2-OM3; SVG-RCA    INTRAOPERATIVE TRANSESOPHAGEAL ECHOCARDIOGRAM N/A 08/26/2013   Procedure: INTRAOPERATIVE TRANSESOPHAGEAL ECHOCARDIOGRAM;  Surgeon: Grace Isaac, MD;  Location: [facility];  Service: Open Heart Surgery;  Laterality: N/A;    REVIEW OF SYSTEMS:  A comprehensive review of  systems was negative.   PHYSICAL EXAMINATION: General appearance: alert, cooperative, and no distress Head: Normocephalic, without obvious abnormality, atraumatic Neck: no adenopathy, no JVD, supple, symmetrical, trachea midline, and thyroid not enlarged, symmetric, no tenderness/mass/nodules Lymph nodes: Cervical, supraclavicular, and axillary nodes normal. Resp: clear to auscultation bilaterally Back: symmetric, no curvature. ROM normal. No CVA tenderness. Cardio: regular rate and rhythm, S1, S2 normal, no murmur, click, rub or gallop GI: soft, non-tender; bowel sounds normal; no masses,  no  organomegaly Extremities: extremities normal, atraumatic, no cyanosis or edema  ECOG PERFORMANCE STATUS: 1 - Symptomatic but completely ambulatory  Blood pressure (!) 158/79, pulse 60, temperature (!) 97.4 F (36.3 C), temperature source Tympanic, resp. rate 19, height 5\' 9"  (1.753 m), weight 187 lb 14.4 oz (85.2 kg), SpO2 100 %.  LABORATORY DATA: Lab Results  Component Value Date   WBC 8.0 03/14/2021   HGB 14.3 03/14/2021   HCT 44.5 03/14/2021   MCV 84.6 03/14/2021   PLT 144 (L) 03/14/2021      Chemistry      Component Value Date/Time   NA 141 12/02/2020 1550   K 4.5 12/02/2020 1550   CL 105 12/02/2020 1550   CO2 30 12/02/2020 1550   BUN 21 12/02/2020 1550   CREATININE 1.66 (H) 12/02/2020 1550      Component Value Date/Time   CALCIUM 8.9 12/02/2020 1550   ALKPHOS 37 (L) 11/25/2020 0443   AST 17 12/02/2020 1550   AST 19 08/15/2020 1337   ALT 18 12/02/2020 1550   ALT 19 08/15/2020 1337   BILITOT 0.3 12/02/2020 1550   BILITOT 0.5 08/15/2020 1337       RADIOGRAPHIC STUDIES: No results found.  ASSESSMENT AND PLAN: This is a very pleasant 71 years old African-American male with anemia and thrombocytopenia likely secondary to vitamin B12 deficiency. The patient had extensive blood work for evaluation of his condition and these were unremarkable except for the vitamin B12 deficiency. He was started on treatment with vitamin B12 1000 mcg intramuscular weekly for 3 weeks and starting from today he will be on monthly treatment.  His last vitamin B12 injection several months ago and since that time he has been on oral vitamin B12 supplements. Repeat CBC today showed normal hemoglobin and hematocrit.  Iron study and ferritin are within the normal range. Vitamin B12 and serum folate are still pending. I recommended for the patient to continue with the oral vitamin B12 supplement as planned.  He is feeling fine.  He will follow-up with his primary care physician from now on. I  will see him in the future on as-needed basis. He was advised to call if he has any concerning issues.  The patient voices understanding of current disease status and treatment options and is in agreement with the current care plan.  All questions were answered. The patient knows to call the clinic with any problems, questions or concerns. We can certainly see the patient much sooner if necessary.  Disclaimer: This note was dictated with voice recognition software. Similar sounding words can inadvertently be transcribed and may not be corrected upon review.

## 2021-05-10 DIAGNOSIS — H401131 Primary open-angle glaucoma, bilateral, mild stage: Secondary | ICD-10-CM | POA: Diagnosis not present

## 2021-05-10 DIAGNOSIS — H0102B Squamous blepharitis left eye, upper and lower eyelids: Secondary | ICD-10-CM | POA: Diagnosis not present

## 2021-05-10 DIAGNOSIS — H43812 Vitreous degeneration, left eye: Secondary | ICD-10-CM | POA: Diagnosis not present

## 2021-05-10 DIAGNOSIS — Z961 Presence of intraocular lens: Secondary | ICD-10-CM | POA: Diagnosis not present

## 2021-05-10 DIAGNOSIS — H04123 Dry eye syndrome of bilateral lacrimal glands: Secondary | ICD-10-CM | POA: Diagnosis not present

## 2021-05-10 DIAGNOSIS — H0102A Squamous blepharitis right eye, upper and lower eyelids: Secondary | ICD-10-CM | POA: Diagnosis not present

## 2021-05-10 DIAGNOSIS — E119 Type 2 diabetes mellitus without complications: Secondary | ICD-10-CM | POA: Diagnosis not present

## 2021-06-02 ENCOUNTER — Ambulatory Visit (INDEPENDENT_AMBULATORY_CARE_PROVIDER_SITE_OTHER): Payer: Medicare Other | Admitting: Family

## 2021-06-02 ENCOUNTER — Encounter: Payer: Self-pay | Admitting: Family

## 2021-06-02 VITALS — BP 138/60 | HR 61 | Temp 97.9°F | Ht 69.0 in | Wt 182.4 lb

## 2021-06-02 DIAGNOSIS — I2510 Atherosclerotic heart disease of native coronary artery without angina pectoris: Secondary | ICD-10-CM | POA: Diagnosis not present

## 2021-06-02 DIAGNOSIS — C61 Malignant neoplasm of prostate: Secondary | ICD-10-CM | POA: Diagnosis not present

## 2021-06-02 DIAGNOSIS — E119 Type 2 diabetes mellitus without complications: Secondary | ICD-10-CM | POA: Diagnosis not present

## 2021-06-02 LAB — COMPREHENSIVE METABOLIC PANEL
ALT: 14 U/L (ref 0–53)
AST: 16 U/L (ref 0–37)
Albumin: 4.4 g/dL (ref 3.5–5.2)
Alkaline Phosphatase: 59 U/L (ref 39–117)
BUN: 20 mg/dL (ref 6–23)
CO2: 28 mEq/L (ref 19–32)
Calcium: 9.4 mg/dL (ref 8.4–10.5)
Chloride: 108 mEq/L (ref 96–112)
Creatinine, Ser: 1.54 mg/dL — ABNORMAL HIGH (ref 0.40–1.50)
GFR: 45.79 mL/min — ABNORMAL LOW (ref >60.00)
Glucose, Bld: 134 mg/dL — ABNORMAL HIGH (ref 70–99)
Potassium: 4.7 mEq/L (ref 3.5–5.1)
Sodium: 144 mEq/L (ref 135–145)
Total Bilirubin: 0.8 mg/dL (ref 0.2–1.2)
Total Protein: 7.3 g/dL (ref 6.0–8.3)

## 2021-06-02 LAB — MICROALBUMIN / CREATININE URINE RATIO
Creatinine,U: 175.1 mg/dL
Microalb Creat Ratio: 1.3 mg/g (ref 0.0–30.0)
Microalb, Ur: 2.2 mg/dL — ABNORMAL HIGH (ref 0.0–1.9)

## 2021-06-02 LAB — HEMOGLOBIN A1C: Hgb A1c MFr Bld: 9 % — ABNORMAL HIGH (ref 4.6–6.5)

## 2021-06-02 LAB — LIPID PANEL
Cholesterol: 191 mg/dL (ref 0–200)
HDL: 47.4 mg/dL (ref >39.00)
LDL Cholesterol: 128 mg/dL — ABNORMAL HIGH (ref 0–99)
NonHDL: 143.13
Total CHOL/HDL Ratio: 4
Triglycerides: 75 mg/dL (ref 0.0–149.0)
VLDL: 15 mg/dL (ref 0.0–40.0)

## 2021-06-02 NOTE — Progress Notes (Signed)
Angel Benson is a 71 y.o. male with the following history as recorded in Epic[employer]:  Patient Active Problem List   Diagnosis Date Noted   Hematochezia    Decreased hemoglobin    Seizure-like activity ([facility]) 11/24/2020   Hypertension associated with diabetes ([facility]) 11/24/2020   Elevated CK 11/24/2020   CKD (chronic kidney disease), stage III ([facility]) 11/24/2020   Left nephrolithiasis 11/24/2020   Leukocytosis 11/24/2020   B12 deficiency 07/14/2020   Anemia 07/14/2020   Thrombocytopenia ([facility]) 07/14/2020   Cardiomyopathy, ischemic 09/27/2014   Routine general medical examination at a health care facility 02/01/2014   S/P CABG x 5 08/26/2013   Hyperlipidemia associated with type 2 diabetes mellitus ([facility]) 08/25/2013   Coronary artery disease involving native coronary artery of native heart without angina pectoris 08/25/2013   Benign essential HTN 08/25/2013   Type 2 diabetes mellitus with complication ([facility]) 35/70/1779   NSTEMI (non-ST elevated myocardial infarction) ([facility]) 08/24/2013    Current Outpatient Medications  Medication Sig Dispense Refill   aspirin 81 MG tablet Take 81 mg by mouth daily.     cholecalciferol (VITAMIN D3) 25 MCG (1000 UNIT) tablet Take 1,000 Units by mouth daily.     metoprolol succinate (TOPROL-XL) 50 MG 24 hr tablet TAKE 1 TABLET DAILY WITH OR IMMEDIATELY FOLLOWING A MEAL 90 tablet 3   Multiple Vitamin (MULTIVITAMIN) tablet Take 1 tablet by mouth daily.     Netarsudil-Latanoprost (ROCKLATAN) 0.02-0.005 % SOLN Place 1 drop into the left eye at bedtime.     Omega-3 Fatty Acids (FISH OIL) 1000 MG CAPS Take 1 capsule by mouth at bedtime.     rosuvastatin (CRESTOR) 40 MG tablet Take 1 tablet (40 mg total) by mouth daily. 90 tablet 3   SIMBRINZA 1-0.2 % SUSP Place 1 drop into both eyes in the morning and at bedtime.     timolol (TIMOPTIC) 0.5 % ophthalmic solution Place 1 drop into the left eye every morning.     No current facility-administered medications for this  visit.    Allergies: Patient has no known allergies.  Past Medical History:  Diagnosis Date   Anemia    CAD, multiple vessel    s/p CABG   Cataract    bilateral cataract removed   Chicken pox    Diabetes mellitus without complication ([facility])    Type 2   GERD (gastroesophageal reflux disease)    Glaucoma    Hypercholesteremia    Hypertension    Myocardial infarction [facility]) 2015   then CABG   Prostate CA ([facility]) 2022   "watching it" - no sx planned at this time (09/05/2020)   Tuberculosis 1993   received tx post exposure to person with TB   Urinary tract bacterial infections    Vitamin B12 deficiency     Past Surgical History:  Procedure Laterality Date   CARDIAC CATHETERIZATION     CATARACT EXTRACTION, BILATERAL     CORONARY ARTERY BYPASS GRAFT N/A 08/26/2013   Procedure: CORONARY ARTERY BYPASS GRAFTING (CABG) times five, using left internal mammary artery, right and left greater saphenous vein;  Surgeon: Grace Isaac, MD;  Location: [facility];  Service: Open Heart Surgery;  Laterality: N/A;  LIMA-LAD; SEQ SVG-OM1-OM2-OM3; SVG-RCA    INTRAOPERATIVE TRANSESOPHAGEAL ECHOCARDIOGRAM N/A 08/26/2013   Procedure: INTRAOPERATIVE TRANSESOPHAGEAL ECHOCARDIOGRAM;  Surgeon: Grace Isaac, MD;  Location: [facility];  Service: Open Heart Surgery;  Laterality: N/A;    Family History  Problem Relation Age of Onset   Diabetes Mother  Alzheimer's disease Mother    Heart disease Father    Diabetes Father    Heart disease Brother    Colon polyps Neg Hx    Colon cancer Neg Hx    Esophageal cancer Neg Hx    Rectal cancer Neg Hx    Stomach cancer Neg Hx     Social History   Tobacco Use   Smoking status: Never    Passive exposure: Past   Smokeless tobacco: Never  Substance Use Topics   Alcohol use: No    Subjective:  6 month follow up on chronic care needs; Needs updated labs for diet controlled diabetes; Has been released by hematology; Continuing to see urology every 6 months;    Denies any chest pain, shortness of breath, blurred vision or headache      Objective:  Vitals:   06/02/21 1029  BP: 138/60  Pulse: 61  Temp: 97.9 F (36.6 C)  TempSrc: Oral  SpO2: 99%  Weight: 182 lb 6.4 oz (82.7 kg)  Height: 5' 9"  (1.753 m)    General: Well developed, well nourished, in no acute distress  Skin : Warm and dry.  Head: Normocephalic and atraumatic  Lungs: Respirations unlabored; clear to auscultation bilaterally without wheeze, rales, rhonchi  CVS exam: normal rate and regular rhythm.  Neurologic: Alert and oriented; speech intact; face symmetrical; moves all extremities well; CNII-XII intact without focal deficit   Assessment:  1. Diet-controlled type 2 diabetes mellitus ([place])   2. Coronary artery disease involving native coronary artery of native heart without angina pectoris   3. Prostate cancer [facility])     Plan:  Labs updated; encouraged to start exercising regularly; referral to dietician; follow up in 4-6 months;  Referral back to his cardiologist for yearly check up;  Stable; under care of urology every 6 months;   30 minutes spent reviewing records/ developing care plan  No follow-ups on file.  Orders Placed This Encounter  Procedures   Comp Met (CMET)   Hemoglobin A1c   Lipid panel   Urine Microalbumin w/creat. ratio   Amb ref to Medical Nutrition Therapy-MNT    Referral Priority:   Routine    Referral Type:   Consultation    Referral Reason:   Specialty Services Required    Requested Specialty:   Nutrition    Number of Visits Requested:   1   Ambulatory referral to Cardiology    Referral Priority:   Routine    Referral Type:   Consultation    Referral Reason:   Specialty Services Required    Referred to Provider:   Pixie Casino, MD    Requested Specialty:   Cardiology    Number of Visits Requested:   1    Requested Prescriptions    No prescriptions requested or ordered in this encounter

## 2021-06-06 ENCOUNTER — Other Ambulatory Visit: Payer: Self-pay | Admitting: Family

## 2021-06-06 MED ORDER — DAPAGLIFLOZIN PROPANEDIOL 5 MG PO TABS: 5.0000 mg | ORAL_TABLET | Freq: Every day | ORAL | 2 refills | Status: DC

## 2021-06-08 ENCOUNTER — Telehealth: Payer: Self-pay | Admitting: *Deleted

## 2021-06-08 MED ORDER — DAPAGLIFLOZIN PROPANEDIOL 5 MG PO TABS: 5.0000 mg | ORAL_TABLET | Freq: Every day | ORAL | 2 refills | Status: DC

## 2021-06-08 NOTE — Telephone Encounter (Signed)
Spoke with patient and he will be going to [facility] since [facility] does not take [employer].  The ins that [facility] have is Part B and [facility] is not covered.  Rx sent to [facility].

## 2021-06-08 NOTE — Telephone Encounter (Signed)
Prior auth started via cover my meds.  Awaiting determination.  Key: [id number]

## 2021-06-08 NOTE — Telephone Encounter (Signed)
Your request was denied We've denied the Part B Drug listed below requested by you or your doctor: Wilder Glade Tab '5mg'$  Why did we deny your request? We denied the Part B Drug listed above because: FARXIGA TAB '5MG'$  is a benefit exclusion under your Medicare Advantage (MA) plan. Drug coverage provided by your plan is limited only to those drugs covered under your medical benefit. Please refer to your Evidence of Coverage (EOC) For further information. Your Medicare Advantage (MA) plan does not cover outpatient prescription drugs. However, you may have a separate commercial pharmacy benefit or Medicare Part D benefit managed by another carrier. If you have prescription drug coverage with another carrier, please contact them directly for information about how to access your prescription drug benefit. Reviewed by: Romilda JoyPh.

## 2021-06-09 ENCOUNTER — Telehealth: Payer: Self-pay | Admitting: Pharmacist

## 2021-06-09 DIAGNOSIS — H0102A Squamous blepharitis right eye, upper and lower eyelids: Secondary | ICD-10-CM | POA: Diagnosis not present

## 2021-06-09 DIAGNOSIS — H43812 Vitreous degeneration, left eye: Secondary | ICD-10-CM | POA: Diagnosis not present

## 2021-06-09 DIAGNOSIS — H401131 Primary open-angle glaucoma, bilateral, mild stage: Secondary | ICD-10-CM | POA: Diagnosis not present

## 2021-06-09 DIAGNOSIS — E119 Type 2 diabetes mellitus without complications: Secondary | ICD-10-CM | POA: Diagnosis not present

## 2021-06-09 DIAGNOSIS — Z961 Presence of intraocular lens: Secondary | ICD-10-CM | POA: Diagnosis not present

## 2021-06-09 DIAGNOSIS — H0102B Squamous blepharitis left eye, upper and lower eyelids: Secondary | ICD-10-CM | POA: Diagnosis not present

## 2021-06-09 DIAGNOSIS — H04123 Dry eye syndrome of bilateral lacrimal glands: Secondary | ICD-10-CM | POA: Diagnosis not present

## 2021-06-09 MED ORDER — EMPAGLIFLOZIN 10 MG PO TABS
10.0000 mg | ORAL_TABLET | Freq: Every day | ORAL | 2 refills | Status: DC
Start: 2021-06-09 — End: 2021-07-20

## 2021-06-09 NOTE — Telephone Encounter (Signed)
Called to see if Angel Benson was filled yesterday by [facility]. Unfortunately Angel Benson is not preferred on [employer]. Preferred SGLT2 is Jardiance. Discussed with Jodi Mourning, NP and Ok switch to [place] '10mg'$  daily.  Rx sent to pharmacy for #30 with 2 refills.  Patient was notified and education provided about switch and side effects to monitor for discussed.  Will check in with patient in 2 to 3 weeks to see if blood glucose improving.

## 2021-06-21 DIAGNOSIS — H401131 Primary open-angle glaucoma, bilateral, mild stage: Secondary | ICD-10-CM | POA: Diagnosis not present

## 2021-06-21 DIAGNOSIS — H0102B Squamous blepharitis left eye, upper and lower eyelids: Secondary | ICD-10-CM | POA: Diagnosis not present

## 2021-06-21 DIAGNOSIS — H43812 Vitreous degeneration, left eye: Secondary | ICD-10-CM | POA: Diagnosis not present

## 2021-06-21 DIAGNOSIS — H04123 Dry eye syndrome of bilateral lacrimal glands: Secondary | ICD-10-CM | POA: Diagnosis not present

## 2021-06-21 DIAGNOSIS — E119 Type 2 diabetes mellitus without complications: Secondary | ICD-10-CM | POA: Diagnosis not present

## 2021-06-21 DIAGNOSIS — H0102A Squamous blepharitis right eye, upper and lower eyelids: Secondary | ICD-10-CM | POA: Diagnosis not present

## 2021-06-21 DIAGNOSIS — Z961 Presence of intraocular lens: Secondary | ICD-10-CM | POA: Diagnosis not present

## 2021-06-21 LAB — HM DIABETES EYE EXAM

## 2021-06-27 ENCOUNTER — Ambulatory Visit: Payer: Medicare Other | Admitting: Pharmacist

## 2021-06-27 DIAGNOSIS — E118 Type 2 diabetes mellitus with unspecified complications: Secondary | ICD-10-CM

## 2021-06-27 DIAGNOSIS — I2510 Atherosclerotic heart disease of native coronary artery without angina pectoris: Secondary | ICD-10-CM

## 2021-06-27 DIAGNOSIS — E785 Hyperlipidemia, unspecified: Secondary | ICD-10-CM

## 2021-06-27 NOTE — Chronic Care Management (AMB) (Addendum)
Care Management   Pharmacy Note  06/27/2021 Name: Angel Benson MRN: [id number] DOB: 05/10/1950  Subjective: Angel Benson is a 71 y.o. year old male who is a primary care patient of Angel Benson, [place]. The Care Management team was consulted for assistance with care management and care coordination needs.    Engaged with patient by telephone for follow up visit in response to provider referral for pharmacy case management and/or care coordination services.   The patient was given information about Care Management services today including:  Care Management services includes personalized support from designated clinical staff supervised by the patient's primary care provider, including individualized plan of care and coordination with other care providers. 24/7 contact phone numbers for assistance for urgent and routine care needs. The patient may stop case management services at any time by phone call to the office staff.  Patient agreed to services and consent obtained.  Assessment:  Review of patient status, including review of consultants reports, laboratory and other test data, was performed as part of comprehensive evaluation and provision of chronic care management services.   SDOH (Social Determinants of Health) assessments and interventions performed:    Objective:  Lab Results  Component Value Date   CREATININE 1.54 (H) 06/02/2021   CREATININE 1.66 (H) 12/02/2020   CREATININE 1.39 (H) 11/26/2020    Lab Results  Component Value Date   HGBA1C 9.0 (H) 06/02/2021       Component Value Date/Time   CHOL 191 06/02/2021 1100   TRIG 75.0 06/02/2021 1100   HDL 47.40 06/02/2021 1100   CHOLHDL 4 06/02/2021 1100   VLDL 15.0 06/02/2021 1100   LDLCALC 128 (H) 06/02/2021 1100   LDLCALC 69 08/25/2019 1140    Other: (TSH, CBC, Vit D, etc.)  Clinical ASCVD: Yes  The ASCVD Risk score (Arnett DK, et al., 2019) failed to calculate for the following reasons:   The  patient has a prior MI or stroke diagnosis      BP Readings from Last 3 Encounters:  06/02/21 138/60  03/14/21 (!) 158/79  12/12/20 (!) 150/82    Care Plan  No Known Allergies  Medications Reviewed Today     Reviewed by Angel Benson, RPH-CPP (Pharmacist) on 06/27/21 at 1125  Med List Status: <None>   Medication Order Taking? Sig Documenting Provider Last Dose Status Informant  aspirin 81 MG tablet [id number] Yes Take 81 mg by mouth daily. [provider] Taking Active Self  cholecalciferol (VITAMIN D3) 25 MCG (1000 UNIT) tablet [id number] Yes Take 1,000 Units by mouth daily. [provider] Taking Active Self  empagliflozin (JARDIANCE) 10 MG TABS tablet [id number] Yes Take 1 tablet (10 mg total) by mouth daily before breakfast. Angel Salvage, FNP Taking Active   metoprolol succinate (TOPROL-XL) 50 MG 24 hr tablet [id number] Yes TAKE 1 TABLET DAILY WITH OR IMMEDIATELY FOLLOWING A MEAL Angel Dross Marvis Repress, FNP Taking Active Self  Multiple Vitamin (MULTIVITAMIN) tablet [id number] Yes Take 1 tablet by mouth daily. [provider] Taking Active Self  Netarsudil-Latanoprost (ROCKLATAN) 0.02-0.005 % SOLN [id number] Yes Place 1 drop into both eyes at bedtime. [provider] Taking Active Self  Omega-3 Fatty Acids (FISH OIL) 1000 MG CAPS [id number] Yes Take 1 capsule by mouth at bedtime. [provider] Taking Active Self  rosuvastatin (CRESTOR) 40 MG tablet [id number] Yes Take 1 tablet (40 mg total) by mouth daily. Angel Salvage, FNP Taking Active Self  SIMBRINZA 1-0.2 % SUSP [id number] Yes Place 1 drop into both  eyes in the morning and at bedtime. [provider] Taking Active Self  timolol (TIMOPTIC) 0.5 % ophthalmic solution [id number] Yes Place 1 drop into both eyes every morning. [provider] Taking Active Self  vitamin B-12 (CYANOCOBALAMIN) 1000 MCG tablet [id number] Yes Take 1,000 mcg by mouth daily. [provider] Taking Active             Patient Active Problem List   Diagnosis Date Noted   Hematochezia    Decreased hemoglobin    Seizure-like activity ([place]) 11/24/2020   Hypertension associated with diabetes ([place]) 11/24/2020   Elevated CK 11/24/2020   CKD (chronic kidney disease), stage III ([place]) 11/24/2020   Left nephrolithiasis 11/24/2020   Leukocytosis 11/24/2020   B12 deficiency 07/14/2020   Anemia 07/14/2020   Thrombocytopenia ([place]) 07/14/2020   Cardiomyopathy, ischemic 09/27/2014   Routine general medical examination at a health care facility 02/01/2014   S/P CABG x 5 08/26/2013   Hyperlipidemia associated with type 2 diabetes mellitus ([place]) 08/25/2013   Coronary artery disease involving native coronary artery of native heart without angina pectoris 08/25/2013   Benign essential HTN 08/25/2013   Type 2 diabetes mellitus with complication ([place]) 93/57/0177   NSTEMI (non-ST elevated myocardial infarction) ([place]) 08/24/2013    Conditions to be addressed/monitored: HTN, HLD, and DMII  Diabetes: Patient was unable to afford [place] and prior authorization was denied. I assisted patient 2 weeks ago in reviewing formulary and finding covered alternative. He started Jardiance '10mg'$  daily 06/18/2021. Reports blood glucose was 139 yesterday morning.  Hypertension: taking metoprolol ER '50mg'$  daily. Recent office blood pressure readings have been at goal of < 140/90 Hyperlipidemia: patient has been taking rosuvastatin '40mg'$  daily. Reports he does not miss doses. Last LDL was not at goal.   Medication Assistance:  None required.  Patient affirms current coverage meets needs.  Plan:  Continue Jardaince '10mg'$  daily. Discussed blood glucose goals and A1c goal. Patient will f/u with PCP in July 2023.  Had eye exam with Angel Benson in the last 2 weeks. Requested copy of eye exam from office [phone] (had to leave message on VM for request)  Discussed blood pressure goals.  Continue current therapy.  Discussed LDL goal of < 70; If next LDL is > 70, recommended adding ezetimibe '10mg'$  daily to rosuvastatin '40mg'$  daily; Discussed limiting in take of saturated fat / fried foods.    Follow Up:  Patient agrees to Care Plan and Follow-up.  Plan: Telephone follow up appointment with care management team member scheduled for:  2 to 3 months  Angel Benson, PharmD Clinical Pharmacist [facility]  I have personally reviewed this encounter including the documentation in this note and have collaborated with the care management provider regarding care management and care coordination activities to include development and update of the comprehensive care plan. I am certifying that I agree with the content of this note and encounter as supervising physician.

## 2021-07-20 ENCOUNTER — Ambulatory Visit (INDEPENDENT_AMBULATORY_CARE_PROVIDER_SITE_OTHER): Payer: Medicare Other | Admitting: Family

## 2021-07-20 VITALS — BP 140/70 | HR 72 | Temp 98.2°F | Resp 18 | Ht 69.0 in | Wt 178.6 lb

## 2021-07-20 DIAGNOSIS — R300 Dysuria: Secondary | ICD-10-CM | POA: Diagnosis not present

## 2021-07-20 DIAGNOSIS — E118 Type 2 diabetes mellitus with unspecified complications: Secondary | ICD-10-CM

## 2021-07-20 DIAGNOSIS — C61 Malignant neoplasm of prostate: Secondary | ICD-10-CM

## 2021-07-20 LAB — POCT URINALYSIS DIP (CLINITEK)
Bilirubin, UA: NEGATIVE
Glucose, UA: >=1000 mg/dL — AB
Ketones, POC UA: NEGATIVE mg/dL
Leukocytes, UA: NEGATIVE
Nitrite, UA: NEGATIVE
POC PROTEIN,UA: 100 — AB
Spec Grav, UA: >=1.03 — AB (ref 1.010–1.025)
Urobilinogen, UA: 0.2 E.U./dL
pH, UA: 5 (ref 5.0–8.0)

## 2021-07-20 MED ORDER — TAMSULOSIN HCL 0.4 MG PO CAPS: 0.4000 mg | ORAL_CAPSULE | Freq: Every day | ORAL | 1 refills | Status: DC

## 2021-07-20 MED ORDER — AMOXICILLIN-POT CLAVULANATE 875-125 MG PO TABS: 1.0000 | ORAL_TABLET | Freq: Two times a day (BID) | ORAL | 0 refills | Status: AC

## 2021-07-20 NOTE — Progress Notes (Signed)
Angel Benson is a 71 y.o. male with the following history as recorded in EpicCare:  Patient Active Problem List   Diagnosis Date Noted   Hematochezia    Decreased hemoglobin    Seizure-like activity ([place]) 11/24/2020   Hypertension associated with diabetes ([place]) 11/24/2020   Elevated CK 11/24/2020   CKD (chronic kidney disease), stage III ([place]) 11/24/2020   Left nephrolithiasis 11/24/2020   Leukocytosis 11/24/2020   B12 deficiency 07/14/2020   Anemia 07/14/2020   Thrombocytopenia ([place]) 07/14/2020   Cardiomyopathy, ischemic 09/27/2014   Routine general medical examination at a health care facility 02/01/2014   S/P CABG x 5 08/26/2013   Hyperlipidemia associated with type 2 diabetes mellitus ([place]) 08/25/2013   Coronary artery disease involving native coronary artery of native heart without angina pectoris 08/25/2013   Benign essential HTN 08/25/2013   Type 2 diabetes mellitus with complication ([place]) 50/93/2671   NSTEMI (non-ST elevated myocardial infarction) ([place]) 08/24/2013    Current Outpatient Medications  Medication Sig Dispense Refill   amoxicillin-clavulanate (AUGMENTIN) 875-125 MG tablet Take 1 tablet by mouth 2 (two) times daily for 10 days. 20 tablet 0   aspirin 81 MG tablet Take 81 mg by mouth daily.     cholecalciferol (VITAMIN D3) 25 MCG (1000 UNIT) tablet Take 1,000 Units by mouth daily.     metoprolol succinate (TOPROL-XL) 50 MG 24 hr tablet TAKE 1 TABLET DAILY WITH OR IMMEDIATELY FOLLOWING A MEAL 90 tablet 3   Multiple Vitamin (MULTIVITAMIN) tablet Take 1 tablet by mouth daily.     Netarsudil-Latanoprost (ROCKLATAN) 0.02-0.005 % SOLN Place 1 drop into both eyes at bedtime.     Omega-3 Fatty Acids (FISH OIL) 1000 MG CAPS Take 1 capsule by mouth at bedtime.     rosuvastatin (CRESTOR) 40 MG tablet Take 1 tablet (40 mg total) by mouth daily. 90 tablet 3   SIMBRINZA 1-0.2 % SUSP Place 1 drop into both eyes in the morning and at bedtime.     tamsulosin (FLOMAX) 0.4 MG  CAPS capsule Take 1 capsule (0.4 mg total) by mouth daily after supper. 30 capsule 1   timolol (TIMOPTIC) 0.5 % ophthalmic solution Place 1 drop into both eyes every morning.     vitamin B-12 (CYANOCOBALAMIN) 1000 MCG tablet Take 1,000 mcg by mouth daily.     No current facility-administered medications for this visit.    Allergies: Patient has no known allergies.  Past Medical History:  Diagnosis Date   Anemia    CAD, multiple vessel    s/p CABG   Cataract    bilateral cataract removed   Chicken pox    Diabetes mellitus without complication ([place])    Type 2   GERD (gastroesophageal reflux disease)    Glaucoma    Hypercholesteremia    Hypertension    Myocardial infarction [facility]) 2015   then CABG   Prostate CA ([place]) 2022   "watching it" - no sx planned at this time (09/05/2020)   Tuberculosis 1993   received tx post exposure to person with TB   Urinary tract bacterial infections    Vitamin B12 deficiency     Past Surgical History:  Procedure Laterality Date   CARDIAC CATHETERIZATION     CATARACT EXTRACTION, BILATERAL     CORONARY ARTERY BYPASS GRAFT N/A 08/26/2013   Procedure: CORONARY ARTERY BYPASS GRAFTING (CABG) times five, using left internal mammary artery, right and left greater saphenous vein;  Surgeon: Grace Isaac, MD;  Location: [facility];  Service: Open  Heart Surgery;  Laterality: N/A;  LIMA-LAD; SEQ SVG-OM1-OM2-OM3; SVG-RCA    INTRAOPERATIVE TRANSESOPHAGEAL ECHOCARDIOGRAM N/A 08/26/2013   Procedure: INTRAOPERATIVE TRANSESOPHAGEAL ECHOCARDIOGRAM;  Surgeon: Grace Isaac, MD;  Location: [facility];  Service: Open Heart Surgery;  Laterality: N/A;    Family History  Problem Relation Age of Onset   Diabetes Mother    Alzheimer's disease Mother    Heart disease Father    Diabetes Father    Heart disease Brother    Colon polyps Neg Hx    Colon cancer Neg Hx    Esophageal cancer Neg Hx    Rectal cancer Neg Hx    Stomach cancer Neg Hx     Social History    Tobacco Use   Smoking status: Never    Passive exposure: Past   Smokeless tobacco: Never  Substance Use Topics   Alcohol use: No    Subjective:   Started Jardiance in early June; notes that he was initially tolerating well but began to notice increased urinary frequency over the past few weeks; notes that last week did see blood in his urine; Patient stopped Jardiance on Monday of this week- thinks symptoms "slightly better";  Does have history of "slow growing prostate cancer"- known enlarged prostate;  Opted not to re-start Metformin due to creatinine levels; patient does not want to use injectable medication.     Objective:  Vitals:   07/20/21 1553  BP: 140/70  Pulse: 72  Resp: 18  Temp: 98.2 F (36.8 C)  TempSrc: Oral  SpO2: 99%  Weight: 178 lb 9.6 oz (81 kg)  Height: _0  (1.753 m)    General: Well developed, well nourished, in no acute distress  Skin : Warm and dry.  Head: Normocephalic and atraumatic  Lungs: Respirations unlabored; clear to auscultation bilaterally without wheeze, rales, rhonchi  CVS exam: normal rate and regular rhythm.  Neurologic: Alert and oriented; speech intact; face symmetrical; moves all extremities well; CNII-XII intact without focal deficit   Assessment:  1. Dysuria   2. Prostate cancer ([place])   3. Type 2 diabetes mellitus with complication (HCC)     Plan:  D/C Jardiance; will check U/A and urine culture; Rx for Augmentin and Flomax to treat for suspected acute infection; Will update labs today to determine options that can be safely used to manage Type 2 Diabetes; will follow up with patient tomorrow.   No follow-ups on file.  Orders Placed This Encounter  Procedures   Urine Culture   CBC with Differential/Platelet   Comp Met (CMET)   POCT URINALYSIS DIP (CLINITEK)    Requested Prescriptions   Signed Prescriptions Disp Refills   tamsulosin (FLOMAX) 0.4 MG CAPS capsule 30 capsule 1    Sig: Take 1 capsule (0.4 mg total) by  mouth daily after supper.   amoxicillin-clavulanate (AUGMENTIN) 875-125 MG tablet 20 tablet 0    Sig: Take 1 tablet by mouth 2 (two) times daily for 10 days.

## 2021-07-21 ENCOUNTER — Other Ambulatory Visit: Payer: Self-pay | Admitting: Family

## 2021-07-21 LAB — COMPREHENSIVE METABOLIC PANEL
ALT: 35 U/L (ref 0–53)
AST: 27 U/L (ref 0–37)
Albumin: 3.6 g/dL (ref 3.5–5.2)
Alkaline Phosphatase: 66 U/L (ref 39–117)
BUN: 27 mg/dL — ABNORMAL HIGH (ref 6–23)
CO2: 26 mEq/L (ref 19–32)
Calcium: 8.9 mg/dL (ref 8.4–10.5)
Chloride: 104 mEq/L (ref 96–112)
Creatinine, Ser: 1.58 mg/dL — ABNORMAL HIGH (ref 0.40–1.50)
GFR: 44.36 mL/min — ABNORMAL LOW (ref >60.00)
Glucose, Bld: 228 mg/dL — ABNORMAL HIGH (ref 70–99)
Potassium: 4.3 mEq/L (ref 3.5–5.1)
Sodium: 141 mEq/L (ref 135–145)
Total Bilirubin: 0.5 mg/dL (ref 0.2–1.2)
Total Protein: 6.5 g/dL (ref 6.0–8.3)

## 2021-07-21 LAB — CBC WITH DIFFERENTIAL/PLATELET
Basophils Absolute: 0.1 10*3/uL (ref 0.0–0.1)
Basophils Relative: 0.8 % (ref 0.0–3.0)
Eosinophils Absolute: 0.1 10*3/uL (ref 0.0–0.7)
Eosinophils Relative: 1.3 % (ref 0.0–5.0)
HCT: 40.2 % (ref 39.0–52.0)
Hemoglobin: 12.9 g/dL — ABNORMAL LOW (ref 13.0–17.0)
Lymphocytes Relative: 13.6 % (ref 12.0–46.0)
Lymphs Abs: 1.3 10*3/uL (ref 0.7–4.0)
MCHC: 32.2 g/dL (ref 30.0–36.0)
MCV: 86.5 fl (ref 78.0–100.0)
Monocytes Absolute: 1.1 10*3/uL — ABNORMAL HIGH (ref 0.1–1.0)
Monocytes Relative: 11.9 % (ref 3.0–12.0)
Neutro Abs: 6.8 10*3/uL (ref 1.4–7.7)
Neutrophils Relative %: 72.4 % (ref 43.0–77.0)
Platelets: 165 10*3/uL (ref 150.0–400.0)
RBC: 4.65 Mil/uL (ref 4.22–5.81)
RDW: 14.8 % (ref 11.5–15.5)
WBC: 9.4 10*3/uL (ref 4.0–10.5)

## 2021-07-21 MED ORDER — GLIMEPIRIDE 1 MG PO TABS: 1.0000 mg | ORAL_TABLET | Freq: Every day | ORAL | 0 refills | Status: DC

## 2021-07-21 MED ORDER — METFORMIN HCL 500 MG PO TABS: 500.0000 mg | ORAL_TABLET | Freq: Two times a day (BID) | ORAL | 1 refills | Status: DC

## 2021-07-22 LAB — URINE CULTURE
MICRO NUMBER:: 13612180
SPECIMEN QUALITY:: ADEQUATE

## 2021-08-08 ENCOUNTER — Encounter: Payer: Self-pay | Admitting: Family

## 2021-08-08 ENCOUNTER — Ambulatory Visit (INDEPENDENT_AMBULATORY_CARE_PROVIDER_SITE_OTHER): Payer: Medicare Other | Admitting: Family

## 2021-08-08 ENCOUNTER — Other Ambulatory Visit: Payer: Self-pay | Admitting: Family

## 2021-08-08 VITALS — BP 140/76 | HR 67 | Temp 97.5°F | Resp 16 | Ht 69.0 in | Wt 178.0 lb

## 2021-08-08 DIAGNOSIS — D649 Anemia, unspecified: Secondary | ICD-10-CM | POA: Diagnosis not present

## 2021-08-08 DIAGNOSIS — R5383 Other fatigue: Secondary | ICD-10-CM | POA: Diagnosis not present

## 2021-08-08 DIAGNOSIS — E559 Vitamin D deficiency, unspecified: Secondary | ICD-10-CM

## 2021-08-08 DIAGNOSIS — R350 Frequency of micturition: Secondary | ICD-10-CM

## 2021-08-08 DIAGNOSIS — E118 Type 2 diabetes mellitus with unspecified complications: Secondary | ICD-10-CM

## 2021-08-08 LAB — COMPREHENSIVE METABOLIC PANEL
ALT: 19 U/L (ref 0–53)
AST: 18 U/L (ref 0–37)
Albumin: 4.1 g/dL (ref 3.5–5.2)
Alkaline Phosphatase: 55 U/L (ref 39–117)
BUN: 17 mg/dL (ref 6–23)
CO2: 29 mEq/L (ref 19–32)
Calcium: 9.5 mg/dL (ref 8.4–10.5)
Chloride: 108 mEq/L (ref 96–112)
Creatinine, Ser: 1.84 mg/dL — ABNORMAL HIGH (ref 0.40–1.50)
GFR: 36.93 mL/min — ABNORMAL LOW (ref >60.00)
Glucose, Bld: 118 mg/dL — ABNORMAL HIGH (ref 70–99)
Potassium: 5 mEq/L (ref 3.5–5.1)
Sodium: 144 mEq/L (ref 135–145)
Total Bilirubin: 0.7 mg/dL (ref 0.2–1.2)
Total Protein: 7.1 g/dL (ref 6.0–8.3)

## 2021-08-08 LAB — CBC WITH DIFFERENTIAL/PLATELET
Basophils Absolute: 0 10*3/uL (ref 0.0–0.1)
Basophils Relative: 0.6 % (ref 0.0–3.0)
Eosinophils Absolute: 0.2 10*3/uL (ref 0.0–0.7)
Eosinophils Relative: 2.5 % (ref 0.0–5.0)
HCT: 41.9 % (ref 39.0–52.0)
Hemoglobin: 13.5 g/dL (ref 13.0–17.0)
Lymphocytes Relative: 23.2 % (ref 12.0–46.0)
Lymphs Abs: 1.6 10*3/uL (ref 0.7–4.0)
MCHC: 32.2 g/dL (ref 30.0–36.0)
MCV: 86.5 fl (ref 78.0–100.0)
Monocytes Absolute: 0.5 10*3/uL (ref 0.1–1.0)
Monocytes Relative: 7.7 % (ref 3.0–12.0)
Neutro Abs: 4.7 10*3/uL (ref 1.4–7.7)
Neutrophils Relative %: 66 % (ref 43.0–77.0)
Platelets: 138 10*3/uL — ABNORMAL LOW (ref 150.0–400.0)
RBC: 4.84 Mil/uL (ref 4.22–5.81)
RDW: 14.2 % (ref 11.5–15.5)
WBC: 7.1 10*3/uL (ref 4.0–10.5)

## 2021-08-08 LAB — VITAMIN D 25 HYDROXY (VIT D DEFICIENCY, FRACTURES): VITD: 58.85 ng/mL (ref 30.00–100.00)

## 2021-08-08 LAB — TSH: TSH: 2.09 u[IU]/mL (ref 0.35–5.50)

## 2021-08-08 LAB — VITAMIN B12: Vitamin B-12: 900 pg/mL (ref 211–911)

## 2021-08-08 LAB — HEMOGLOBIN A1C: Hgb A1c MFr Bld: 8.5 % — ABNORMAL HIGH (ref 4.6–6.5)

## 2021-08-08 MED ORDER — INSULIN PEN NEEDLE 32G X 6 MM MISC: 1 refills | Status: DC

## 2021-08-08 MED ORDER — LANTUS SOLOSTAR 100 UNIT/ML ~~LOC~~ SOPN: 10.0000 [IU] | PEN_INJECTOR | Freq: Every day | SUBCUTANEOUS | 2 refills | Status: DC

## 2021-08-08 NOTE — Progress Notes (Signed)
Angel Benson is a 71 y.o. male with the following history as recorded in EpicCare:  Patient Active Problem List   Diagnosis Date Noted   Hematochezia    Decreased hemoglobin    Seizure-like activity ([place]) 11/24/2020   Hypertension associated with diabetes ([place]) 11/24/2020   Elevated CK 11/24/2020   CKD (chronic kidney disease), stage III ([place]) 11/24/2020   Left nephrolithiasis 11/24/2020   Leukocytosis 11/24/2020   B12 deficiency 07/14/2020   Anemia 07/14/2020   Thrombocytopenia ([place]) 07/14/2020   Cardiomyopathy, ischemic 09/27/2014   Routine general medical examination at a health care facility 02/01/2014   S/P CABG x 5 08/26/2013   Hyperlipidemia associated with type 2 diabetes mellitus ([place]) 08/25/2013   Coronary artery disease involving native coronary artery of native heart without angina pectoris 08/25/2013   Benign essential HTN 08/25/2013   Type 2 diabetes mellitus with complication ([place]) 07/86/7544   NSTEMI (non-ST elevated myocardial infarction) ([place]) 08/24/2013    Current Outpatient Medications  Medication Sig Dispense Refill   aspirin 81 MG tablet Take 81 mg by mouth daily.     cholecalciferol (VITAMIN D3) 25 MCG (1000 UNIT) tablet Take 1,000 Units by mouth daily.     glimepiride (AMARYL) 1 MG tablet Take 1 tablet (1 mg total) by mouth daily with breakfast. Use as directed if fasting blood sugar is greater than 200 30 tablet 0   metFORMIN (GLUCOPHAGE) 500 MG tablet Take 1 tablet (500 mg total) by mouth 2 (two) times daily with a meal. 60 tablet 1   metoprolol succinate (TOPROL-XL) 50 MG 24 hr tablet TAKE 1 TABLET DAILY WITH OR IMMEDIATELY FOLLOWING A MEAL 90 tablet 3   Multiple Vitamin (MULTIVITAMIN) tablet Take 1 tablet by mouth daily.     Netarsudil-Latanoprost (ROCKLATAN) 0.02-0.005 % SOLN Place 1 drop into both eyes at bedtime.     Omega-3 Fatty Acids (FISH OIL) 1000 MG CAPS Take 1 capsule by mouth at bedtime.     rosuvastatin (CRESTOR) 40 MG tablet Take 1 tablet  (40 mg total) by mouth daily. 90 tablet 3   SIMBRINZA 1-0.2 % SUSP Place 1 drop into both eyes in the morning and at bedtime.     tamsulosin (FLOMAX) 0.4 MG CAPS capsule Take 1 capsule (0.4 mg total) by mouth daily after supper. 30 capsule 1   timolol (TIMOPTIC) 0.5 % ophthalmic solution Place 1 drop into both eyes every morning.     vitamin B-12 (CYANOCOBALAMIN) 1000 MCG tablet Take 1,000 mcg by mouth daily.     No current facility-administered medications for this visit.    Allergies: Jardiance [empagliflozin]  Past Medical History:  Diagnosis Date   Anemia    CAD, multiple vessel    s/p CABG   Cataract    bilateral cataract removed   Chicken pox    Diabetes mellitus without complication ([place])    Type 2   GERD (gastroesophageal reflux disease)    Glaucoma    Hypercholesteremia    Hypertension    Myocardial infarction ([place]) 2015   then CABG   Prostate CA ([place]) 2022   "watching it" - no sx planned at this time (09/05/2020)   Tuberculosis 1993   received tx post exposure to person with TB   Urinary tract bacterial infections    Vitamin B12 deficiency     Past Surgical History:  Procedure Laterality Date   CARDIAC CATHETERIZATION     CATARACT EXTRACTION, BILATERAL     CORONARY ARTERY BYPASS GRAFT N/A 08/26/2013  Procedure: CORONARY ARTERY BYPASS GRAFTING (CABG) times five, using left internal mammary artery, right and left greater saphenous vein;  Surgeon: Grace Isaac, MD;  Location: [facility];  Service: Open Heart Surgery;  Laterality: N/A;  LIMA-LAD; SEQ SVG-OM1-OM2-OM3; SVG-RCA    INTRAOPERATIVE TRANSESOPHAGEAL ECHOCARDIOGRAM N/A 08/26/2013   Procedure: INTRAOPERATIVE TRANSESOPHAGEAL ECHOCARDIOGRAM;  Surgeon: Grace Isaac, MD;  Location: [facility];  Service: Open Heart Surgery;  Laterality: N/A;    Family History  Problem Relation Age of Onset   Diabetes Mother    Alzheimer's disease Mother    Heart disease Father    Diabetes Father    Heart disease Brother     Colon polyps Neg Hx    Colon cancer Neg Hx    Esophageal cancer Neg Hx    Rectal cancer Neg Hx    Stomach cancer Neg Hx     Social History   Tobacco Use   Smoking status: Never    Passive exposure: Past   Smokeless tobacco: Never  Substance Use Topics   Alcohol use: No    Subjective:   Patient was started on Jardiance for management of Type 2 diabetes; could not tolerate- developed infection/ urinary frequency; feeling much better and having no further nighttime urinary symptoms; Was started on low dose of Metformin due to history of mild CKD and given Amaryl to use prn; notes he has not been taking this- only Metformin; fasting blood sugar still not in range; does not want to use injectable unless absolutely necessary;  Also concerned that body is "deficient" in something- nails are brittle/ cracking;    Objective:  Vitals:   08/08/21 1037 08/08/21 1236  BP: (!) 172/74 140/76  Pulse: 67   Resp: 16   Temp: (!) 97.5 F (36.4 C)   TempSrc: Oral   SpO2: 99%   Weight: 178 lb (80.7 kg)   Height: 5' 9"  (1.753 m)     General: Well developed, well nourished, in no acute distress  Skin : Warm and dry.  Head: Normocephalic and atraumatic  Lungs: Respirations unlabored; clear to auscultation bilaterally without wheeze, rales, rhonchi  CVS exam: normal rate and regular rhythm.  Neurologic: Alert and oriented; speech intact; face symmetrical; moves all extremities well; CNII-XII intact without focal deficit   Assessment:  1. Urinary frequency   2. Type 2 diabetes mellitus with complication (HCC)   3. Anemia, unspecified type   4. Vitamin D deficiency   5. Other fatigue     Plan:  Resolved; repeat urine culture to ensure resolution;  Update labs today- patient understands that kidney functions will dictate some of what we can safely use to treat his diabetes; follow up to be determined; Check labs today; may need to refer back to hematology; Check labs; Check TSH today;   No  follow-ups on file.  Orders Placed This Encounter  Procedures   Urine Culture   CBC with Differential/Platelet   Comp Met (CMET)   Hemoglobin A1c   CBC with Differential/Platelet   Comp Met (CMET)   Iron, TIBC and Ferritin Panel   B12   Vitamin D (25 hydroxy)   Hemoglobin A1c   TSH    Requested Prescriptions    No prescriptions requested or ordered in this encounter

## 2021-08-08 NOTE — Progress Notes (Signed)
Spoke to patient regarding labs and worsening renal function; he will stop Metformin and Glimepiride; he will meet with our clinical pharmacist tomorrow to discuss insulin start- he will bring pen and needles to office; Will start with 10 units Lantus daily for now.  Follow up with me in 2-3 months or sooner based on pharmacist's recommendations.

## 2021-08-09 ENCOUNTER — Ambulatory Visit: Payer: Medicare Other | Admitting: Pharmacist

## 2021-08-09 DIAGNOSIS — E118 Type 2 diabetes mellitus with unspecified complications: Secondary | ICD-10-CM

## 2021-08-09 LAB — URINE CULTURE
MICRO NUMBER:: 13691252
Result:: NO GROWTH
SPECIMEN QUALITY:: ADEQUATE

## 2021-08-09 LAB — IRON,TIBC AND FERRITIN PANEL
%SAT: 40 % (calc) (ref 20–48)
Ferritin: 90 ng/mL (ref 24–380)
Iron: 125 ug/dL (ref 50–180)
TIBC: 313 mcg/dL (calc) (ref 250–425)

## 2021-08-11 NOTE — Chronic Care Management (AMB) (Addendum)
Care Management   Pharmacy Note  08/11/2021 Name: Angel Benson MRN: [id number] DOB: 19-Nov-1950  Subjective: Angel Benson is a 71 y.o. year old male who is a primary care patient of Angel Benson, [place]. The Care Management team was consulted for assistance with care management and care coordination needs.    Engaged with patient face to face for follow up visit in response to provider referral for pharmacy case management and care coordination services (insulin new start and education on insulin administration)  The patient was given information about Care Management services today including:  Care Management services includes personalized support from designated clinical staff supervised by the patient's primary care provider, including individualized plan of care and coordination with other care providers. 24/7 contact phone numbers for assistance for urgent and routine care needs. The patient may stop case management services at any time by phone call to the office staff.  Patient agreed to services and consent obtained.  Assessment:  Review of patient status, including review of consultants reports, laboratory and other test data, was performed as part of comprehensive evaluation and provision of chronic care management services.   SDOH (Social Determinants of Health) assessments and interventions performed:    Objective:  Lab Results  Component Value Date   CREATININE 1.84 (H) 08/08/2021   CREATININE 1.58 (H) 07/20/2021   CREATININE 1.54 (H) 06/02/2021    Lab Results  Component Value Date   HGBA1C 8.5 (H) 08/08/2021       Component Value Date/Time   CHOL 191 06/02/2021 1100   TRIG 75.0 06/02/2021 1100   HDL 47.40 06/02/2021 1100   CHOLHDL 4 06/02/2021 1100   VLDL 15.0 06/02/2021 1100   LDLCALC 128 (H) 06/02/2021 1100   LDLCALC 69 08/25/2019 1140    Clinical ASCVD: Yes  The ASCVD Risk score (Arnett DK, et al., 2019) failed to calculate for the following  reasons:   The patient has a prior MI or stroke diagnosis      BP Readings from Last 3 Encounters:  08/08/21 140/76  07/20/21 140/70  06/02/21 138/60    Care Plan  Allergies  Allergen Reactions   Jardiance [Empagliflozin] Other (See Comments)    UTI    Medications Reviewed Today     Reviewed by Cherre Robins, RPH-CPP (Pharmacist) on 08/09/21 at 20  Med List Status: <None>   Medication Order Taking? Sig Documenting Provider Last Dose Status Informant  aspirin 81 MG tablet [id number] Yes Take 81 mg by mouth daily. [provider] Taking Active Self  cholecalciferol (VITAMIN D3) 25 MCG (1000 UNIT) tablet [id number] Yes Take 1,000 Units by mouth daily. [provider] Taking Active Self  glimepiride (AMARYL) 1 MG tablet [id number] Yes Take 1 tablet (1 mg total) by mouth daily with breakfast. Use as directed if fasting blood sugar is greater than 200 Angel Salvage, FNP Taking Active   insulin glargine (LANTUS SOLOSTAR) 100 UNIT/ML Solostar Pen [id number] No Inject 10 Units into the skin daily.  Patient not taking: Reported on 08/09/2021   Angel Benson, [place] Not Taking Active   Insulin Pen Needle 32G X 6 MM MISC [id number] No Use daily as directed for insulin administration  Patient not taking: Reported on 08/09/2021   Angel Salvage, FNP Not Taking Active   metoprolol succinate (TOPROL-XL) 50 MG 24 hr tablet [id number] Yes TAKE 1 TABLET DAILY WITH OR IMMEDIATELY FOLLOWING A MEAL Angel Benson Angel Repress, FNP Taking Active Self  Multiple Vitamin (MULTIVITAMIN) tablet [id number] Yes Take  1 tablet by mouth daily. [provider] Taking Active Self  Netarsudil-Latanoprost (ROCKLATAN) 0.02-0.005 % SOLN [id number] Yes Place 1 drop into both eyes at bedtime. [provider] Taking Active Self  Omega-3 Fatty Acids (FISH OIL) 1000 MG CAPS [id number] Yes Take 1 capsule by mouth at bedtime. [provider] Taking Active Self   rosuvastatin (CRESTOR) 40 MG tablet [id number] Yes Take 1 tablet (40 mg total) by mouth daily. Angel Salvage, FNP Taking Active Self  SIMBRINZA 1-0.2 % SUSP [id number] Yes Place 1 drop into both eyes in the morning and at bedtime. [provider] Taking Active Self  tamsulosin (FLOMAX) 0.4 MG CAPS capsule [id number] Yes Take 1 capsule (0.4 mg total) by mouth daily after supper. Angel Salvage, FNP Taking Active   timolol (TIMOPTIC) 0.5 % ophthalmic solution [id number] Yes Place 1 drop into both eyes every morning. [provider] Taking Active Self  vitamin B-12 (CYANOCOBALAMIN) 1000 MCG tablet [id number] Yes Take 1,000 mcg by mouth daily. [provider] Taking Active             Patient Active Problem List   Diagnosis Date Noted   Hematochezia    Decreased hemoglobin    Seizure-like activity ([place]) 11/24/2020   Hypertension associated with diabetes ([place]) 11/24/2020   Elevated CK 11/24/2020   CKD (chronic kidney disease), stage III ([place]) 11/24/2020   Left nephrolithiasis 11/24/2020   Leukocytosis 11/24/2020   B12 deficiency 07/14/2020   Anemia 07/14/2020   Thrombocytopenia ([place]) 07/14/2020   Cardiomyopathy, ischemic 09/27/2014   Routine general medical examination at a health care facility 02/01/2014   S/P CABG x 5 08/26/2013   Hyperlipidemia associated with type 2 diabetes mellitus ([place]) 08/25/2013   Coronary artery disease involving native coronary artery of native heart without angina pectoris 08/25/2013   Benign essential HTN 08/25/2013   Type 2 diabetes mellitus with complication ([place]) 74/25/9563   NSTEMI (non-ST elevated myocardial infarction) ([place]) 08/24/2013    Conditions to be addressed/monitored: HTN, HLD, and DMII  Diabetes: Due to increase in Scr, PCP has stopped metformin. She would like patient to start Lantus 10 units daily and has referred patient to Clinical Pharmacist Practitioner for education on insulin injection.  However I checked blood glucose in office today and was 117. He continued to take glimepiride '1mg'$  this morning and last dose of metformin was the morning of 08/08/2021. We tried SGLT2 agent in past but he developed UTI with jardiance and farxiga was non formulary / too expensive.   Reviewed patient's home blood glucose - only check each morning. Blood glucose has been 120 to 140 over the last 2 weeks.  Hypertension: taking metoprolol ER '50mg'$  daily. Recent office blood pressure readings have been at goal of < 140/90 Hyperlipidemia: patient has been taking rosuvastatin '40mg'$  daily. Reports he does not miss doses. Last LDL was not at goal.   Medication Assistance:  None required.  Patient affirms current coverage meets needs.  Plan:  Recommended patient check blood glucose twice a day. Continue glimepiride '1mg'$  daily.  Will not start Lantus yet. Plan to follow up with patient over the next few days to check blood glucose trends. IF blood glucose is over 150, then will either increase glimepiride to '2mg'$  daily, change to repaglinide prior to each meal or change to Lantus 10 unit daily.  Discussed blood pressure goals. Continue current therapy.  Discussed LDL goal of < 70; If next LDL is > 70 discussed adding ezetimibe '10mg'$  daily to rosuvastatin  $'40mg'K$  daily; Discussed limiting in take of saturated fat / fried foods. (Addressed at previous visit)    Follow Up:  Patient agrees to Care Plan and Follow-up.  Plan: Telephone follow up appointment with care management team member scheduled for:  2 to 3 months  Cherre Robins, PharmD Clinical Pharmacist [facility] [place]  08/10/2021 Addendum:  Called patient to check blood glucose - reports the following home blood glucose readings:   7/26 at 7pm - 137  7/27 in am - 106   7/27/ after lunch 140 Recommended he still hold off on starting Lantus; Continue glimepiride '1mg'$  daily. Call if blood glucose > 200.  Will follow up by phone in  3 days.   I have personally reviewed this encounter including the documentation in this note and have collaborated with the care management provider regarding care management and care coordination activities to include development and update of the comprehensive care plan. I am certifying that I agree with the content of this note and encounter as supervising physician.

## 2021-08-14 ENCOUNTER — Other Ambulatory Visit: Payer: Self-pay | Admitting: Family

## 2021-08-14 ENCOUNTER — Ambulatory Visit: Payer: Medicare Other | Admitting: Pharmacist

## 2021-08-14 DIAGNOSIS — E118 Type 2 diabetes mellitus with unspecified complications: Secondary | ICD-10-CM

## 2021-08-14 NOTE — Chronic Care Management (AMB) (Signed)
Care Management   Pharmacy Note  08/14/2021 Name: Angel Benson MRN: [id number] DOB: 08-08-1950  Subjective: Angel Benson is a 71 y.o. year old male who is a primary care patient of [name], [place]. The Care Management team was consulted for assistance with care management and care coordination needs.    Engaged with patient face to face for follow up visit in response to provider referral for pharmacy case management and care coordination services (insulin new start and education on insulin administration)  The patient was given information about Care Management services today including:  Care Management services includes personalized support from designated clinical staff supervised by the patient's primary care provider, including individualized plan of care and coordination with other care providers. 24/7 contact phone numbers for assistance for urgent and routine care needs. The patient may stop case management services at any time by phone call to the office staff.  Patient agreed to services and consent obtained.  Assessment:  Review of patient status, including review of consultants reports, laboratory and other test data, was performed as part of comprehensive evaluation and provision of chronic care management services.   SDOH (Social Determinants of Health) assessments and interventions performed:    Objective:  Lab Results  Component Value Date   CREATININE 1.84 (H) 08/08/2021   CREATININE 1.58 (H) 07/20/2021   CREATININE 1.54 (H) 06/02/2021    Lab Results  Component Value Date   HGBA1C 8.5 (H) 08/08/2021       Component Value Date/Time   CHOL 191 06/02/2021 1100   TRIG 75.0 06/02/2021 1100   HDL 47.40 06/02/2021 1100   CHOLHDL 4 06/02/2021 1100   VLDL 15.0 06/02/2021 1100   LDLCALC 128 (H) 06/02/2021 1100   LDLCALC 69 08/25/2019 1140    Clinical ASCVD: Yes  The ASCVD Risk score (Arnett DK, et al., 2019) failed to calculate for the following  reasons:   The patient has a prior MI or stroke diagnosis      BP Readings from Last 3 Encounters:  08/08/21 140/76  07/20/21 140/70  06/02/21 138/60    Care Plan  Allergies  Allergen Reactions   Jardiance [Empagliflozin] Other (See Comments)    UTI    Medications Reviewed Today     Reviewed by Cherre Robins, RPH-CPP (Pharmacist) on 08/14/21 at [phone]  Med List Status: <None>   Medication Order Taking? Sig Documenting Provider Last Dose Status Informant  aspirin 81 MG tablet [id number] Yes Take 81 mg by mouth daily. [provider] Taking Active Self  cholecalciferol (VITAMIN D3) 25 MCG (1000 UNIT) tablet [id number] Yes Take 1,000 Units by mouth daily. [provider] Taking Active Self  glimepiride (AMARYL) 1 MG tablet [id number] Yes Take 1 tablet (1 mg total) by mouth daily with breakfast. Use as directed if fasting blood sugar is greater than 200 [name], FNP Taking Active   insulin glargine (LANTUS SOLOSTAR) 100 UNIT/ML Solostar Pen [id number] No Inject 10 Units into the skin daily.  Patient not taking: Reported on 08/09/2021   [name], [place] Not Taking Active   Insulin Pen Needle 32G X 6 MM MISC [id number] No Use daily as directed for insulin administration  Patient not taking: Reported on 08/09/2021   [name], FNP Not Taking Active   metoprolol succinate (TOPROL-XL) 50 MG 24 hr tablet [id number] Yes TAKE 1 TABLET DAILY WITH OR IMMEDIATELY FOLLOWING A MEAL Valere Dross Marvis Repress, FNP Taking Active Self  Multiple Vitamin (MULTIVITAMIN) tablet [id number] Yes Take  1 tablet by mouth daily. [provider] Taking Active Self  Netarsudil-Latanoprost (ROCKLATAN) 0.02-0.005 % SOLN [id number] Yes Place 1 drop into both eyes at bedtime. [provider] Taking Active Self  Omega-3 Fatty Acids (FISH OIL) 1000 MG CAPS [id number] Yes Take 1 capsule by mouth at bedtime. [provider] Taking Active Self   rosuvastatin (CRESTOR) 40 MG tablet [id number] Yes Take 1 tablet (40 mg total) by mouth daily. [name], FNP Taking Active Self  SIMBRINZA 1-0.2 % SUSP [id number] Yes Place 1 drop into both eyes in the morning and at bedtime. [provider] Taking Active Self  tamsulosin (FLOMAX) 0.4 MG CAPS capsule [id number] Yes Take 1 capsule (0.4 mg total) by mouth daily after supper. [name], FNP Taking Active   timolol (TIMOPTIC) 0.5 % ophthalmic solution [id number] Yes Place 1 drop into both eyes every morning. [provider] Taking Active Self  vitamin B-12 (CYANOCOBALAMIN) 1000 MCG tablet [id number] Yes Take 1,000 mcg by mouth daily. [provider] Taking Active             Patient Active Problem List   Diagnosis Date Noted   Hematochezia    Decreased hemoglobin    Seizure-like activity ([place]) 11/24/2020   Hypertension associated with diabetes ([place]) 11/24/2020   Elevated CK 11/24/2020   CKD (chronic kidney disease), stage III ([place]) 11/24/2020   Left nephrolithiasis 11/24/2020   Leukocytosis 11/24/2020   B12 deficiency 07/14/2020   Anemia 07/14/2020   Thrombocytopenia ([place]) 07/14/2020   Cardiomyopathy, ischemic 09/27/2014   Routine general medical examination at a health care facility 02/01/2014   S/P CABG x 5 08/26/2013   Hyperlipidemia associated with type 2 diabetes mellitus ([place]) 08/25/2013   Coronary artery disease involving native coronary artery of native heart without angina pectoris 08/25/2013   Benign essential HTN 08/25/2013   Type 2 diabetes mellitus with complication ([place]) 24/26/8341   NSTEMI (non-ST elevated myocardial infarction) ([place]) 08/24/2013    Conditions to be addressed/monitored: HTN, HLD, and DMII  Diabetes: Due to increase in Scr, PCP has stopped metformin. We were planning to start Lantus 10 units daily but blood glucose in office last week was 117 so patient is holding Lantus.  Blood glucose continues to be  at or near goal depite not taking metformin but continuing glimepiride '1mg'$  daily.  07/28: AM blood glucose = 114; noon blood glucose = 128 7/29: AM blood glucose = 124; noon blood glucose = 149; blood glucose at 11pm = 133 7/30 AM blood glucose = 131; #pm blood glucose = 81; blood pressure at 11pm = 135 7/31 AM blood glucose = 125  We tried SGLT2 agent in past but he developed UTI with jardiance and farxiga was non formulary / too expensive.   R     Medication Assistance:  None required.  Patient affirms current coverage meets needs.  Plan:  Recommended patient check blood glucose twice a day. Continue glimepiride '1mg'$  daily.  Will not start Lantus yet. Plan to follow up with patient in 2 weeks check blood glucose trends. IF blood glucose is over 200 or less than 80 patient is to call before our next follow up in 2 weeks. ,  Follow Up:  Patient agrees to Care Plan and Follow-up.  Plan: Telephone follow up appointment with care management team member scheduled for:  2 weeks  Cherre Robins, PharmD Clinical Pharmacist [facility] [place] [facility]     I have personally reviewed this encounter including  the documentation in this note and have collaborated with the care management provider regarding care management and care coordination activities to include development and update of the comprehensive care plan. I am certifying that I agree with the content of this note and encounter as supervising physician.

## 2021-08-28 ENCOUNTER — Ambulatory Visit: Payer: Medicare Other | Admitting: Pharmacist

## 2021-08-28 DIAGNOSIS — E118 Type 2 diabetes mellitus with unspecified complications: Secondary | ICD-10-CM

## 2021-08-28 NOTE — Chronic Care Management (AMB) (Addendum)
Care Management   Pharmacy Note  08/28/2021 Name: Angel Benson MRN: [id number] DOB: 06-Feb-1950  Subjective: Angel Benson is a 71 y.o. year old male who is a primary care patient of [name], [place]. The Care Management team was consulted for assistance with care management and care coordination needs.    Engaged with patient face to face for follow up visit in response to provider referral for pharmacy case management and care coordination services   Assessment:  Review of patient status, including review of consultants reports, laboratory and other test data, was performed as part of comprehensive evaluation and provision of chronic care management services.   SDOH (Social Determinants of Health) assessments and interventions performed:    Objective:  Lab Results  Component Value Date   CREATININE 1.84 (H) 08/08/2021   CREATININE 1.58 (H) 07/20/2021   CREATININE 1.54 (H) 06/02/2021    Lab Results  Component Value Date   HGBA1C 8.5 (H) 08/08/2021       Component Value Date/Time   CHOL 191 06/02/2021 1100   TRIG 75.0 06/02/2021 1100   HDL 47.40 06/02/2021 1100   CHOLHDL 4 06/02/2021 1100   VLDL 15.0 06/02/2021 1100   LDLCALC 128 (H) 06/02/2021 1100   LDLCALC 69 08/25/2019 1140    Clinical ASCVD: Yes  The ASCVD Risk score (Arnett DK, et al., 2019) failed to calculate for the following reasons:   The patient has a prior MI or stroke diagnosis      BP Readings from Last 3 Encounters:  08/08/21 140/76  07/20/21 140/70  06/02/21 138/60    Care Plan  Allergies  Allergen Reactions   Jardiance [Empagliflozin] Other (See Comments)    UTI    Medications Reviewed Today     Reviewed by Cherre Robins, RPH-CPP (Pharmacist) on 08/28/21 at 24  Med List Status: <None>   Medication Order Taking? Sig Documenting Provider Last Dose Status Informant  aspirin 81 MG tablet [id number] Yes Take 81 mg by mouth daily. [provider] Taking Active Self   cholecalciferol (VITAMIN D3) 25 MCG (1000 UNIT) tablet [id number] Yes Take 1,000 Units by mouth daily. [provider] Taking Active Self  glimepiride (AMARYL) 1 MG tablet [id number] No TAKE 1 TABLET BY MOUTH WITH BREAKFAST.  Patient not taking: Reported on 08/28/2021   [name], FNP Not Taking Active   insulin glargine (LANTUS SOLOSTAR) 100 UNIT/ML Solostar Pen [id number] No Inject 10 Units into the skin daily.  Patient not taking: Reported on 08/09/2021   [name], [place] Not Taking Active   Insulin Pen Needle 32G X 6 MM MISC [id number] No Use daily as directed for insulin administration  Patient not taking: Reported on 08/09/2021   [name], FNP Not Taking Active   metoprolol succinate (TOPROL-XL) 50 MG 24 hr tablet [id number] Yes TAKE 1 TABLET DAILY WITH OR IMMEDIATELY FOLLOWING A MEAL [name], FNP Taking Active   Multiple Vitamin (MULTIVITAMIN) tablet [id number] Yes Take 1 tablet by mouth daily. [provider] Taking Active Self  Netarsudil-Latanoprost (ROCKLATAN) 0.02-0.005 % SOLN [id number] Yes Place 1 drop into both eyes at bedtime. [provider] Taking Active Self  Omega-3 Fatty Acids (FISH OIL) 1000 MG CAPS [id number] Yes Take 1 capsule by mouth at bedtime. [provider] Taking Active Self  rosuvastatin (CRESTOR) 40 MG tablet [id number] Yes TAKE 1 TABLET DAILY [name], FNP Taking Active   SIMBRINZA 1-0.2 % SUSP [id number] Yes Place 1 drop into both eyes in  the morning and at bedtime. [provider] Taking Active Self  tamsulosin (FLOMAX) 0.4 MG CAPS capsule [id number] Yes Take 1 capsule (0.4 mg total) by mouth daily after supper. [name], FNP Taking Active   timolol (TIMOPTIC) 0.5 % ophthalmic solution [id number] Yes Place 1 drop into both eyes every morning. [provider] Taking Active Self  vitamin B-12 (CYANOCOBALAMIN) 1000 MCG tablet [id number] Yes Take  1,000 mcg by mouth daily. [provider] Taking Active             Patient Active Problem List   Diagnosis Date Noted   Hematochezia    Decreased hemoglobin    Seizure-like activity ([place]) 11/24/2020   Hypertension associated with diabetes ([place]) 11/24/2020   Elevated CK 11/24/2020   CKD (chronic kidney disease), stage III ([place]) 11/24/2020   Left nephrolithiasis 11/24/2020   Leukocytosis 11/24/2020   B12 deficiency 07/14/2020   Anemia 07/14/2020   Thrombocytopenia ([place]) 07/14/2020   Cardiomyopathy, ischemic 09/27/2014   Routine general medical examination at a health care facility 02/01/2014   S/P CABG x 5 08/26/2013   Hyperlipidemia associated with type 2 diabetes mellitus ([place]) 08/25/2013   Coronary artery disease involving native coronary artery of native heart without angina pectoris 08/25/2013   Benign essential HTN 08/25/2013   Type 2 diabetes mellitus with complication ([place]) 24/58/0998   NSTEMI (non-ST elevated myocardial infarction) ([place]) 08/24/2013    Conditions to be addressed/monitored: HTN, HLD, and DMII  Diabetes: Due to increase in Scr, PCP has stopped metformin. We were planning to start Lantus 10 units daily but blood glucose in office on day of insulin education was 117 so we did not start insulin. Patient has been checking blood glucose 2 to 3 times a day over the last 2 weeks.  Blood glucose continues to be at or near goal depite not taking metformin but continuing glimepiride '1mg'$  daily.  07/28: AM blood glucose = 114; noon blood glucose = 128 7/29: AM blood glucose = 124; noon blood glucose = 149; blood glucose at 11pm = 133 7/30 AM blood glucose = 131; pm blood glucose = 81; blood pressure at 11pm = 135 7/31 AM blood glucose = 125 08/15/2021 AM blood glucose = 137; Noon blood glucose = 92 ; PM blood glucose = 118 08/16/2021 AM blood glucose = 104; Noon blood glucose = 109; PM blood glucose = 108 08/17/2021 AM blood glucose = 91; Noon blood glucose  = 115; PM blood glucose = 122 08/18/2021 AM blood glucose = 113; [place]on blood glucose = 87; PM blood glucose = 109 08/19/2021 AM blood glucose = 108; PM blood glucose = 82 08/20/2021 AM blood glucose = 125; PM blood glucose = 92 08/21/2021 AM blood glucose = 133; Noon blood glucose = 66 08/22/2021 AM blood glucose = 98; Noon blood glucose =76  08/23/2021 AM blood glucose = 110; Noon blood glucose = 106 08/24/2021 AM blood glucose = 119; Noon blood glucose = 128  08/25/2021 AM blood glucose = 110; Noon blood glucose = 114 08/26/2021 AM blood glucose = 108; Noon blood glucose = 112; PM blood glucose =  08/27/2021 AM blood glucose = 95; PM blood glucose = 110 08/28/2021 AM blood glucose = 103  Patient tried SGLT2 agent in past but he developed UTI with jardiance and farxiga was non formulary / too expensive.   R     Medication Assistance:  None required.  Patient affirms current coverage meets needs.  Plan:  Recommended patient check  blood glucose once daily and vary time of day Continue glimepiride '1mg'$  daily.  Will not start Lantus. Checking with PCP about repeating A1c and checking fructosamine.   Follow Up:  Patient agrees to Care Plan and Follow-up.  Plan: Telephone follow up appointment with care management team member scheduled for:  4 weeks  Cherre Robins, PharmD Clinical Pharmacist [facility]      I have personally reviewed this encounter including the documentation in this note and have collaborated with the care management provider regarding care management and care coordination activities to include development and update of the comprehensive care plan. I am certifying that I agree with the content of this note and encounter as supervising physician.

## 2021-08-29 ENCOUNTER — Other Ambulatory Visit: Payer: Self-pay | Admitting: Family

## 2021-08-29 DIAGNOSIS — E119 Type 2 diabetes mellitus without complications: Secondary | ICD-10-CM

## 2021-08-30 ENCOUNTER — Telehealth: Payer: Self-pay | Admitting: *Deleted

## 2021-08-30 NOTE — Telephone Encounter (Signed)
-----   Message from [name], [place] sent at 08/29/2021 12:43 PM EDT ----- Please call him and let him know that I would like him to come get repeat labs in early September- by the 5th at possible so the results are back when he does his phone call follow up with Tammy on the 12th.  I will put lab orders in place for him.  ----- Message ----- From: Cherre Robins, RPH-CPP Sent: 08/28/2021   3:24 PM EDT To: [name], FNP  I had him down for a phone follow up in September but I can change to in office and check labs or if you want to see him I can call to schedule. I am OK with either.   ----- Message ----- From: [name], FNP Sent: 08/28/2021   2:04 PM EDT To: Cherre Robins, RPH-CPP  I am okay with just checking Hgba1c in September. I think the infection is some of that elevated hgba1c. I am just glad he's doing better. He's one of my favorite patients!! I would also like to re-check his creatinine level. Is he seeing you or me? Does he just need lab orders from me?  ----- Message ----- From: Cherre Robins, RPH-CPP Sent: 08/28/2021  12:04 PM EDT To: [name], FNP  Patient's home blood glucose is really good. Just doesn't correlate with this last A1c of 8.5%. What do you think about checking a fructosamine (gives information about blood glucose control over last 2 to 3 weeks) now or checking A1c at beginning of September?

## 2021-08-30 NOTE — Telephone Encounter (Signed)
Left detailed message on machine for patient to call back to schedule lab only appt.

## 2021-08-31 NOTE — Telephone Encounter (Signed)
Pt has a lab appt 09/19/21

## 2021-09-05 ENCOUNTER — Telehealth: Payer: Self-pay | Admitting: *Deleted

## 2021-09-05 NOTE — Patient Outreach (Signed)
  Care Coordination   09/05/2021 Name: Toby Breithaupt MRN: [id number] DOB: [birth year]   Care Coordination Outreach Attempts:  Contact was made with the patient today to offer care coordination services as a benefit of their health plan. Patient declined services.  Follow Up Plan:  No further outreach attempts will be made at this time.  Encounter Outcome:  Pt. Visit Completed  Care Coordination Interventions Activated:  No   Care Coordination Interventions:  No, not indicated    Emelia Loron RN, BSN [place] [phone] [email or web address]

## 2021-09-19 ENCOUNTER — Other Ambulatory Visit: Payer: Self-pay | Admitting: Family

## 2021-09-19 ENCOUNTER — Other Ambulatory Visit (INDEPENDENT_AMBULATORY_CARE_PROVIDER_SITE_OTHER): Payer: Medicare Other

## 2021-09-19 DIAGNOSIS — R7989 Other specified abnormal findings of blood chemistry: Secondary | ICD-10-CM

## 2021-09-19 DIAGNOSIS — E119 Type 2 diabetes mellitus without complications: Secondary | ICD-10-CM | POA: Diagnosis not present

## 2021-09-19 LAB — COMPREHENSIVE METABOLIC PANEL
ALT: 15 U/L (ref 0–53)
AST: 15 U/L (ref 0–37)
Albumin: 4 g/dL (ref 3.5–5.2)
Alkaline Phosphatase: 56 U/L (ref 39–117)
BUN: 17 mg/dL (ref 6–23)
CO2: 29 mEq/L (ref 19–32)
Calcium: 9.3 mg/dL (ref 8.4–10.5)
Chloride: 107 mEq/L (ref 96–112)
Creatinine, Ser: 1.75 mg/dL — ABNORMAL HIGH (ref 0.40–1.50)
GFR: 39.19 mL/min — ABNORMAL LOW (ref >60.00)
Glucose, Bld: 115 mg/dL — ABNORMAL HIGH (ref 70–99)
Potassium: 4.9 mEq/L (ref 3.5–5.1)
Sodium: 144 mEq/L (ref 135–145)
Total Bilirubin: 0.7 mg/dL (ref 0.2–1.2)
Total Protein: 6.9 g/dL (ref 6.0–8.3)

## 2021-09-19 LAB — HEMOGLOBIN A1C: Hgb A1c MFr Bld: 7.7 % — ABNORMAL HIGH (ref 4.6–6.5)

## 2021-09-26 ENCOUNTER — Ambulatory Visit: Payer: Medicare Other | Admitting: Pharmacist

## 2021-09-26 DIAGNOSIS — E119 Type 2 diabetes mellitus without complications: Secondary | ICD-10-CM

## 2021-09-26 DIAGNOSIS — R7989 Other specified abnormal findings of blood chemistry: Secondary | ICD-10-CM

## 2021-09-26 NOTE — Chronic Care Management (AMB) (Addendum)
Care Management   Pharmacy Note  09/26/2021 Name: Angel Benson MRN: [id number] DOB: 15-Aug-1950  Subjective: Angel Benson is a 71 y.o. year old male who is a primary care patient of [name], [place]. The Care Management team was consulted for assistance with care management and care coordination needs.    Engaged with patient by telephone for follow up visit in response to provider referral for pharmacy case management and care coordination services.     CKD - recent Scr had showed slight improvement. Patient is awaiting appointment with nephrology.    Diabetes: Home blood glucose readings have been 80 to 147. Checking mostly fasting blood glucose but a few have been post prandial. Patient is not currently taking any medication for blood glucose. A1c decreased from 8.5% (08/09/2023) to 7.7% 09/19/2021. Due to increase in Scr, PCP stopped metformin. Had planned to start Lantus 10 units daily but blood glucose in office on day of insulin education was 117 so we did not start insulin.   Blood glucose continues to be at or near goal depite not taking any blood glucose lowering medication 08/24 - 126 08/26 - 110 08/28 - 139 08/29 - 147 08/31 - 121 09/02 - 129 09/04 - 80 09/05 - 141 09/07 - 104 09/09 - 106 09/11 - 108   Patient tried SGLT2 agent in past but he developed UTI with jardiance and farxiga was non formulary / too expensive.    Assessment:  Review of patient status, including review of consultants reports, laboratory and other test data, was performed as part of comprehensive evaluation and provision of chronic care management services.   SDOH (Social Determinants of Health) assessments and interventions performed:    Objective:  Lab Results  Component Value Date   CREATININE 1.75 (H) 09/19/2021   CREATININE 1.84 (H) 08/08/2021   CREATININE 1.58 (H) 07/20/2021    Lab Results  Component Value Date   HGBA1C 7.7 (H) 09/19/2021       Component Value  Date/Time   CHOL 191 06/02/2021 1100   TRIG 75.0 06/02/2021 1100   HDL 47.40 06/02/2021 1100   CHOLHDL 4 06/02/2021 1100   VLDL 15.0 06/02/2021 1100   LDLCALC 128 (H) 06/02/2021 1100   LDLCALC 69 08/25/2019 1140    Clinical ASCVD: Yes  The ASCVD Risk score (Arnett DK, et al., 2019) failed to calculate for the following reasons:   The patient has a prior MI or stroke diagnosis      BP Readings from Last 3 Encounters:  08/08/21 140/76  07/20/21 140/70  06/02/21 138/60    Care Plan  Allergies  Allergen Reactions   Jardiance [Empagliflozin] Other (See Comments)    UTI    Medications Reviewed Today     Reviewed by Cherre Robins, RPH-CPP (Pharmacist) on 08/28/21 at 23  Med List Status: <None>   Medication Order Taking? Sig Documenting Provider Last Dose Status Informant  aspirin 81 MG tablet [id number] Yes Take 81 mg by mouth daily. [provider] Taking Active Self  cholecalciferol (VITAMIN D3) 25 MCG (1000 UNIT) tablet [id number] Yes Take 1,000 Units by mouth daily. [provider] Taking Active Self  glimepiride (AMARYL) 1 MG tablet [id number] No TAKE 1 TABLET BY MOUTH WITH BREAKFAST.  Patient not taking: Reported on 08/28/2021   [name], FNP Not Taking Active   insulin glargine (LANTUS SOLOSTAR) 100 UNIT/ML Solostar Pen [id number] No Inject 10 Units into the skin daily.  Patient not taking: Reported on 08/09/2021  [name], FNP Not Taking Active   Insulin Pen Needle 32G X 6 MM MISC [id number] No Use daily as directed for insulin administration  Patient not taking: Reported on 08/09/2021   [name], FNP Not Taking Active   metoprolol succinate (TOPROL-XL) 50 MG 24 hr tablet [id number] Yes TAKE 1 TABLET DAILY WITH OR IMMEDIATELY FOLLOWING A MEAL Valere Dross Marvis Repress, FNP Taking Active   Multiple Vitamin (MULTIVITAMIN) tablet [id number] Yes Take 1 tablet by mouth daily. [provider] Taking Active Self   Netarsudil-Latanoprost (ROCKLATAN) 0.02-0.005 % SOLN [id number] Yes Place 1 drop into both eyes at bedtime. [provider] Taking Active Self  Omega-3 Fatty Acids (FISH OIL) 1000 MG CAPS [id number] Yes Take 1 capsule by mouth at bedtime. [provider] Taking Active Self  rosuvastatin (CRESTOR) 40 MG tablet [id number] Yes TAKE 1 TABLET DAILY [name], FNP Taking Active   SIMBRINZA 1-0.2 % SUSP [id number] Yes Place 1 drop into both eyes in the morning and at bedtime. [provider] Taking Active Self  tamsulosin (FLOMAX) 0.4 MG CAPS capsule [id number] Yes Take 1 capsule (0.4 mg total) by mouth daily after supper. [name], FNP Taking Active   timolol (TIMOPTIC) 0.5 % ophthalmic solution [id number] Yes Place 1 drop into both eyes every morning. [provider] Taking Active Self  vitamin B-12 (CYANOCOBALAMIN) 1000 MCG tablet [id number] Yes Take 1,000 mcg by mouth daily. [provider] Taking Active             Patient Active Problem List   Diagnosis Date Noted   Hematochezia    Decreased hemoglobin    Seizure-like activity ([place]) 11/24/2020   Hypertension associated with diabetes ([place]) 11/24/2020   Elevated CK 11/24/2020   CKD (chronic kidney disease), stage III ([place]) 11/24/2020   Left nephrolithiasis 11/24/2020   Leukocytosis 11/24/2020   B12 deficiency 07/14/2020   Anemia 07/14/2020   Thrombocytopenia ([place]) 07/14/2020   Cardiomyopathy, ischemic 09/27/2014   Routine general medical examination at a health care facility 02/01/2014   S/P CABG x 5 08/26/2013   Hyperlipidemia associated with type 2 diabetes mellitus ([place]) 08/25/2013   Coronary artery disease involving native coronary artery of native heart without angina pectoris 08/25/2013   Benign essential HTN 08/25/2013   Type 2 diabetes mellitus with complication ([place]) 91/79/1505   NSTEMI (non-ST elevated myocardial infarction) ([place]) 08/24/2013    Conditions  to be addressed/monitored: HTN, HLD, and DMII   Medication Assistance:  None required.  Patient affirms current coverage meets needs.  Plan:  Recommended patient check blood glucose once daily to every other day. Recommended vary time of day he checks BG Provided number for [employer] in [place], [place] Phone: [phone]. Consider SGLT2 - tried Jardiance - caused UTI; [place] is non formulary but we could try for prior authorization / medication assistance program. Will hold off on starting SGLT2 until after initial assessment from nephrology.   Follow Up:  Patient agrees to Care Plan and Follow-up.  Plan: Telephone follow up appointment with care management team member scheduled for:  4 weeks  Cherre Robins, PharmD Clinical Pharmacist [facility] [place] [facility]  I have personally reviewed this encounter including the documentation in this note and have collaborated with the care management provider regarding care management and care coordination activities to include development and update of the comprehensive care plan. I am certifying that I agree with the content of this note and encounter as supervising physician.

## 2021-10-20 ENCOUNTER — Ambulatory Visit: Payer: Medicare Other | Attending: Internal Medicine | Admitting: Internal Medicine

## 2021-10-20 ENCOUNTER — Encounter: Payer: Self-pay | Admitting: Internal Medicine

## 2021-10-20 VITALS — BP 134/70 | HR 58 | Ht 69.0 in | Wt 177.0 lb

## 2021-10-20 DIAGNOSIS — E119 Type 2 diabetes mellitus without complications: Secondary | ICD-10-CM

## 2021-10-20 DIAGNOSIS — I2510 Atherosclerotic heart disease of native coronary artery without angina pectoris: Secondary | ICD-10-CM

## 2021-10-20 DIAGNOSIS — Z951 Presence of aortocoronary bypass graft: Secondary | ICD-10-CM | POA: Diagnosis not present

## 2021-10-20 DIAGNOSIS — E782 Mixed hyperlipidemia: Secondary | ICD-10-CM | POA: Diagnosis not present

## 2021-10-20 NOTE — Patient Instructions (Signed)
Medication Instructions:  Your physician recommends that you continue on your current medications as directed. Please refer to the Current Medication list given to you today.  *If you need a refill on your cardiac medications before your next appointment, please call your pharmacy*  Lab Work: Please return for FASTING labs in 6 months (Lipid)  Our in office lab hours are Monday-Friday 8:00-4:00, closed for lunch 12:45-1:45 pm.  No appointment needed.  [facility] locations:   [place] ([place]) - [address] [place] - [address] [place] - [address] ([facility])  Follow-Up: At [facility], you and your health needs are our priority.  As part of our continuing mission to provide you with exceptional heart care, we have created designated Provider Care Teams.  These Care Teams include your primary Cardiologist (physician) and Advanced Practice Providers (APPs -  Physician Assistants and Nurse Practitioners) who all work together to provide you with the care you need, when you need it.  We recommend signing up for the patient portal called "MyChart".  Sign up information is provided on this After Visit Summary.  MyChart is used to connect with patients for Virtual Visits (Telemedicine).  Patients are able to view lab/test results, encounter notes, upcoming appointments, etc.  Non-urgent messages can be sent to your provider as well.   To learn more about what you can do with MyChart, go to [email or web address].    Your next appointment:   12 month(s)  The format for your next appointment:   In Person  Provider:   Dr. Debara Pickett

## 2021-10-20 NOTE — Progress Notes (Signed)
10/20/2021   PCP: [name], FNP  CC: No complaints  Primary Cardiologist:Dr. Alma Friendly  HPI: 71 year old AA male with no prior cardiac hx was admitted to [facility]24/13 with chest pain, + NSTEMI.  Cardiac cath with severe triple vessel CAD with total occlusion of the distal RCA, severe proximal LAD stenosis involving a long segment, occluded distal LAD, high grade disease in all three obtuse marginal branches and the mid Circumflex.  Mild segmental LV systolic dysfunction.  He then underwent PROCEDURE: Procedure(s):   CORONARY ARTERY BYPASS GRAFTING (CABG)X5 LIMA-LAD; SEQ SVG-OM1-OM2-OM3; SVG-RCA  INTRAOPERATIVE TRANSESOPHAGEAL ECHOCARDIOGRAM EVH RIGHT THIGH OVH RIGHT LOWER AND LEFT LOWER LEG by Dr. Servando Snare 08/26/13.  Pt did well post op, no atrial fib and progressed with out complications.  Since discharge he has done well no complaint today.  Walking 20 min a day- a mile or so.  occ surgical chest pain.  Eating healthy.    Favia has no complaints today. He follows up and is doing well. He started cardiac rehabilitation and is doing well with his exercise. Denies any chest pain. He was supposed to be on lisinopril however the prescription was never placed. We did go ahead and start low-dose lisinopril and he is taking it without complications.   Mr. Ates returns today for follow-up. Overall he is doing very well. He denies any chest pain or worsening shortness of breath. It was noted that during his catheterization EF is reduced  To 45-50%. This is not been reassessed postoperatively. He is also not had a check of his cholesterol in some time.  10/20/2015  Mr. Noda returns today for follow-up. He reports doing well and denies any chest pain or worsening shortness of breath.He saw his primary care provider in August of this year and had blood work including a lipid panel For which she is total cholesterol was 217 however LDL remained very high at 154. This is on low-dose  simvastatin. Based on prescribing regulations by the FDA, we cannot further increase his dose of simvastatin. In fact he needs to be on a high-dose statin and I would recommend switching to Crestor 40 mg daily. After his last office visit we repeated his echocardiogram which showed normalization of LV function. This indicates successful revascularization. Blood pressure is well-controlled today.  05/24/2017  Mr. Borchers was seen today in follow-up.  Is been a couple years since we last saw him.  Overall he is feeling well, but he freely admits that he has been off of his medicines for several months.  Unfortunately his sister died recently of cancer.  He saw his primary care provider who restarted his medicines and he started taking them.  Blood pressure is well controlled today.  His cholesterol profile, not surprisingly is abnormal, being reflective of being off of his Crestor.  Total cholesterol 232, LDL of 167.  Despite this he denied any chest pain or worsening shortness of breath.  08/29/2020  Mr. Desa returns today for follow-up.  I last saw him back in 2019, and at this point he is considered a new patient.  Overall he is doing well.  He does not have any new complaints such as chest pain or worsening shortness of breath.  He had recent labs including a lipid profile in June which demonstrated total cholesterol of 100 109, HDL 41, triglycerides 86 and LDL 51.  Blood pressure is also well controlled today 133/72.  EKG shows a sinus bradycardia without any ischemic  changes.  He says he is very physically active.  He has not had COVID-19 to his knowledge but has had his vaccinations.  10/20/2021  History Petitjean returns today for follow-up.  Overall he says he is doing well.  He denies any chest pain or shortness of breath.  EKG today shows a sinus bradycardia.  Of note his lipids have been fairly labile.  The last 4 lipid measurements have shown LDL cholesterol from the 50s up into the 120s.  He  reports compliance with this medication and says he cannot otherwise explain this.  He had had a diagnosis of prostate cancer but is being managed medically for this.  His A1c is up a little as well recently at 7.7%.  He reports being less physically active recently which might explain some of this.   Allergies  Allergen Reactions   Jardiance [Empagliflozin] Other (See Comments)    UTI    Current Outpatient Medications  Medication Sig Dispense Refill   aspirin 81 MG tablet Take 81 mg by mouth daily.     cholecalciferol (VITAMIN D3) 25 MCG (1000 UNIT) tablet Take 1,000 Units by mouth daily.     glimepiride (AMARYL) 1 MG tablet TAKE 1 TABLET BY MOUTH WITH BREAKFAST. 90 tablet 1   insulin glargine (LANTUS SOLOSTAR) 100 UNIT/ML Solostar Pen Inject 10 Units into the skin daily. 15 mL 2   Insulin Pen Needle 32G X 6 MM MISC Use daily as directed for insulin administration 100 each 1   metoprolol succinate (TOPROL-XL) 50 MG 24 hr tablet TAKE 1 TABLET DAILY WITH OR IMMEDIATELY FOLLOWING A MEAL 90 tablet 1   Multiple Vitamin (MULTIVITAMIN) tablet Take 1 tablet by mouth daily.     Netarsudil-Latanoprost (ROCKLATAN) 0.02-0.005 % SOLN Place 1 drop into both eyes at bedtime.     Omega-3 Fatty Acids (FISH OIL) 1000 MG CAPS Take 1 capsule by mouth at bedtime.     rosuvastatin (CRESTOR) 40 MG tablet TAKE 1 TABLET DAILY 90 tablet 1   SIMBRINZA 1-0.2 % SUSP Place 1 drop into both eyes in the morning and at bedtime.     tamsulosin (FLOMAX) 0.4 MG CAPS capsule TAKE 1 CAPSULE(0.4 MG) BY MOUTH DAILY AFTER SUPPER 30 capsule 1   timolol (TIMOPTIC) 0.5 % ophthalmic solution Place 1 drop into both eyes every morning.     vitamin B-12 (CYANOCOBALAMIN) 1000 MCG tablet Take 1,000 mcg by mouth daily.     No current facility-administered medications for this visit.    Past Medical History:  Diagnosis Date   Anemia    CAD, multiple vessel    s/p CABG   Cataract    bilateral cataract removed   Chicken pox     Diabetes mellitus without complication ([place])    Type 2   GERD (gastroesophageal reflux disease)    Glaucoma    Hypercholesteremia    Hypertension    Myocardial infarction [facility]) 2015   then CABG   Prostate CA ([place]) 2022   "watching it" - no sx planned at this time (09/05/2020)   Tuberculosis 1993   received tx post exposure to person with TB   Urinary tract bacterial infections    Vitamin B12 deficiency     Past Surgical History:  Procedure Laterality Date   CARDIAC CATHETERIZATION     CATARACT EXTRACTION, BILATERAL     CORONARY ARTERY BYPASS GRAFT N/A 08/26/2013   Procedure: CORONARY ARTERY BYPASS GRAFTING (CABG) times five, using left internal mammary artery, right and left  greater saphenous vein;  Surgeon: Grace Isaac, MD;  Location: [facility];  Service: Open Heart Surgery;  Laterality: N/A;  LIMA-LAD; SEQ SVG-OM1-OM2-OM3; SVG-RCA    INTRAOPERATIVE TRANSESOPHAGEAL ECHOCARDIOGRAM N/A 08/26/2013   Procedure: INTRAOPERATIVE TRANSESOPHAGEAL ECHOCARDIOGRAM;  Surgeon: Grace Isaac, MD;  Location: [facility];  Service: Open Heart Surgery;  Laterality: N/A;    ROS: Pertinent items noted in HPI and remainder of comprehensive ROS otherwise negative.   Wt Readings from Last 3 Encounters:  10/20/21 177 lb (80.3 kg)  08/08/21 178 lb (80.7 kg)  07/20/21 178 lb 9.6 oz (81 kg)    PHYSICAL EXAM BP 134/70 (BP Location: Left Arm, Patient Position: Sitting, Cuff Size: Normal)   Pulse (!) 58   Ht '5\' 9"'$  (1.753 m)   Wt 177 lb (80.3 kg)   BMI 26.14 kg/m  General appearance: alert and no distress Neck: no carotid bruit, no JVD and thyroid not enlarged, symmetric, no tenderness/mass/nodules Lungs: clear to auscultation bilaterally Heart: regular rate and rhythm, S1, S2 normal, no murmur, click, rub or gallop Abdomen: soft, non-tender; bowel sounds normal; no masses,  no organomegaly Extremities: extremities normal, atraumatic, no cyanosis or edema Pulses: 2+ and symmetric Skin: Skin  color, texture, turgor normal. No rashes or lesions Neurologic: Grossly normal Psych: [place]  EKG: Sinus bradycardia at 58-personally reviewed  ASSESSMENT AND PLAN Patient Active Problem List   Diagnosis Date Noted   Hematochezia    Decreased hemoglobin    Seizure-like activity (HCC) 11/24/2020   Hypertension associated with diabetes ([place]) 11/24/2020   Elevated CK 11/24/2020   CKD (chronic kidney disease), stage III ([place]) 11/24/2020   Left nephrolithiasis 11/24/2020   Leukocytosis 11/24/2020   B12 deficiency 07/14/2020   Anemia 07/14/2020   Thrombocytopenia ([place]) 07/14/2020   Cardiomyopathy, ischemic 09/27/2014   Routine general medical examination at a health care facility 02/01/2014   S/P CABG x 5 08/26/2013   Hyperlipidemia associated with type 2 diabetes mellitus ([place]) 08/25/2013   Coronary artery disease involving native coronary artery of native heart without angina pectoris 08/25/2013   Benign essential HTN 08/25/2013   Type 2 diabetes mellitus with complication ([place]) 47/42/5956   NSTEMI (non-ST elevated myocardial infarction) ([place]) 08/24/2013   PLAN: 1.  Mr. Tendler denies any chest pain or worsening shortness of breath.  A1c is still little higher than ideal but he is working on that.  He ended up with a UTI on Jardiance and therefore is listed as an intolerance.  His cholesterol has been up and down.  I like to repeat that in 6 months because I am concerned about why this is may be happening.  He does report medication compliance.  May be due to diet or activity levels.  Otherwise no medication changes.  Follow-up annually or sooner as necessary.  Pixie Casino, MD, [facility], [place] Director of the Advanced Lipid Disorders &  Cardiovascular Risk Reduction Clinic Diplomate of the American Board of Clinical Lipidology  Attending Cardiologist  Direct Dial: [phone]  Fax: [phone]  Website:  [email or web address]

## 2021-10-25 DIAGNOSIS — E119 Type 2 diabetes mellitus without complications: Secondary | ICD-10-CM | POA: Diagnosis not present

## 2021-10-25 DIAGNOSIS — H0102A Squamous blepharitis right eye, upper and lower eyelids: Secondary | ICD-10-CM | POA: Diagnosis not present

## 2021-10-25 DIAGNOSIS — H401131 Primary open-angle glaucoma, bilateral, mild stage: Secondary | ICD-10-CM | POA: Diagnosis not present

## 2021-10-25 DIAGNOSIS — H0102B Squamous blepharitis left eye, upper and lower eyelids: Secondary | ICD-10-CM | POA: Diagnosis not present

## 2021-10-25 DIAGNOSIS — H43812 Vitreous degeneration, left eye: Secondary | ICD-10-CM | POA: Diagnosis not present

## 2021-10-25 DIAGNOSIS — H04123 Dry eye syndrome of bilateral lacrimal glands: Secondary | ICD-10-CM | POA: Diagnosis not present

## 2021-10-25 DIAGNOSIS — Z961 Presence of intraocular lens: Secondary | ICD-10-CM | POA: Diagnosis not present

## 2021-10-26 ENCOUNTER — Ambulatory Visit: Payer: Medicare Other | Admitting: Pharmacist

## 2021-10-26 DIAGNOSIS — E785 Hyperlipidemia, unspecified: Secondary | ICD-10-CM

## 2021-10-26 DIAGNOSIS — I2510 Atherosclerotic heart disease of native coronary artery without angina pectoris: Secondary | ICD-10-CM

## 2021-10-26 DIAGNOSIS — R7989 Other specified abnormal findings of blood chemistry: Secondary | ICD-10-CM

## 2021-10-26 DIAGNOSIS — E119 Type 2 diabetes mellitus without complications: Secondary | ICD-10-CM

## 2021-10-26 NOTE — Progress Notes (Addendum)
Pharmacy Note  10/26/2021 Name: Angel Benson MRN: [id number] DOB: July 14, 1950  Subjective: Angel Benson is a 71 y.o. year old male who is a primary care patient of [name], [place]. Clinical Pharmacist Practitioner referral was placed to assist with medication and diabetes management.    Engaged with patient by telephone for follow up visit today.  CKD - recent Scr had showed slight improvement. Patient was provided number for [facility] at last appointment (referral sent by PCP 09/19/2021)  but he has not called to make appointment yet.   Diabetes: Home blood glucose readings have been 85 to 165. Checking mostly fasting blood glucose but a few have been post prandial. Patient is not currently taking any medication to lower blood glucose. A1c decreased from 8.5% (08/09/2023) to 7.7% 09/19/2021. Due to increase in Scr, PCP stopped metformin. Had planned to start Lantus 10 units daily but blood glucose in office on day of insulin education was 117 so we did not start insulin.  Blood glucose continues to be at or near goal despite not taking any blood glucose lowering medication Checking blood glucose at home every OTHER day and varies time he checks:  Home blood glucose from 09/13 to 10/11: 156, 113, 133, 114, 165, 122, 108, 120, 109, 99, 113, 115, 85, 117, 160   Patient tried SGLT2 agent in past but he developed UTI with jardiance and farxiga was non formulary / too expensive.    Hyperlipidemia: LD goal < 70. Patient is taking rosuvastatin '40mg'$  daily - last filled 90 Days on 08/14/2021 (estimated adherence for 2023 based on refill records in 94%). Patient endorses that he is taking rosuvastatin every day. Last LDL was not at goal @ 128. Patient saw cardiologist 106/2023.    Objective: Review of patient status, including review of consultants reports, laboratory and other test data, was performed as part of comprehensive evaluation and provision of chronic care  management services.   Lab Results  Component Value Date   CREATININE 1.75 (H) 09/19/2021   CREATININE 1.84 (H) 08/08/2021   CREATININE 1.58 (H) 07/20/2021    Lab Results  Component Value Date   HGBA1C 7.7 (H) 09/19/2021       Component Value Date/Time   CHOL 191 06/02/2021 1100   TRIG 75.0 06/02/2021 1100   HDL 47.40 06/02/2021 1100   CHOLHDL 4 06/02/2021 1100   VLDL 15.0 06/02/2021 1100   LDLCALC 128 (H) 06/02/2021 1100   LDLCALC 69 08/25/2019 1140     Clinical ASCVD: Yes  The ASCVD Risk score (Arnett DK, et al., 2019) failed to calculate for the following reasons:   The patient has a prior MI or stroke diagnosis    BP Readings from Last 3 Encounters:  10/20/21 134/70  08/08/21 140/76  07/20/21 140/70     Allergies  Allergen Reactions   Jardiance [Empagliflozin] Other (See Comments)    UTI    Medications Reviewed Today     Reviewed by Cherre Robins, RPH-CPP (Pharmacist) on 10/26/21 at 80  Med List Status: <None>   Medication Order Taking? Sig Documenting Provider Last Dose Status Informant  aspirin 81 MG tablet [id number] Yes Take 81 mg by mouth daily. [provider] Taking Active Self  cholecalciferol (VITAMIN D3) 25 MCG (1000 UNIT) tablet [id number] Yes Take 1,000 Units by mouth daily. [provider] Taking Active Self  glimepiride (AMARYL) 1 MG tablet [id number] No TAKE 1 TABLET BY MOUTH WITH BREAKFAST.  Patient not taking: Reported on 10/26/2021  [name], FNP Not Taking Active   metoprolol succinate (TOPROL-XL) 50 MG 24 hr tablet [id number] Yes TAKE 1 TABLET DAILY WITH OR IMMEDIATELY FOLLOWING A MEAL Valere Dross Marvis Repress, FNP Taking Active   Multiple Vitamin (MULTIVITAMIN) tablet [id number] Yes Take 1 tablet by mouth daily. [provider] Taking Active Self  Netarsudil-Latanoprost (ROCKLATAN) 0.02-0.005 % SOLN [id number] Yes Place 1 drop into both eyes at bedtime. [provider] Taking Active Self   Omega-3 Fatty Acids (FISH OIL) 1000 MG CAPS [id number] Yes Take 1 capsule by mouth at bedtime. [provider] Taking Active Self  rosuvastatin (CRESTOR) 40 MG tablet [id number] Yes TAKE 1 TABLET DAILY [name], FNP Taking Active   SIMBRINZA 1-0.2 % SUSP [id number] Yes Place 1 drop into both eyes in the morning and at bedtime. [provider] Taking Active Self  tamsulosin (FLOMAX) 0.4 MG CAPS capsule [id number] No TAKE 1 CAPSULE(0.4 MG) BY MOUTH DAILY AFTER SUPPER  Patient not taking: Reported on 10/26/2021   [name], FNP Not Taking Active   timolol (TIMOPTIC) 0.5 % ophthalmic solution [id number] Yes Place 1 drop into both eyes every morning. [provider] Taking Active Self  vitamin B-12 (CYANOCOBALAMIN) 1000 MCG tablet [id number] Yes Take 1,000 mcg by mouth daily. [provider] Taking Active             Patient Active Problem List   Diagnosis Date Noted   Hematochezia    Decreased hemoglobin    Seizure-like activity ([place]) 11/24/2020   Hypertension associated with diabetes ([place]) 11/24/2020   Elevated CK 11/24/2020   CKD (chronic kidney disease), stage III ([place]) 11/24/2020   Left nephrolithiasis 11/24/2020   Leukocytosis 11/24/2020   B12 deficiency 07/14/2020   Anemia 07/14/2020   Thrombocytopenia ([place]) 07/14/2020   Cardiomyopathy, ischemic 09/27/2014   Routine general medical examination at a health care facility 02/01/2014   S/P CABG x 5 08/26/2013   Hyperlipidemia associated with type 2 diabetes mellitus ([place]) 08/25/2013   Coronary artery disease involving native coronary artery of native heart without angina pectoris 08/25/2013   Benign essential HTN 08/25/2013   Type 2 diabetes mellitus with complication ([place]) 52/77/8242   NSTEMI (non-ST elevated myocardial infarction) ([place]) 08/24/2013     Medication Assistance:  None required.  Patient affirms current coverage meets needs.   Assessment / Plan:  Type 2  DM - controlled per home blood glucose readings.  No change in therapy. Noticed that metfofmin was filled by [facility] 10/19/21  - patient is no longer on this medication and he was unaware of refill. Called [facility] to cancel refill.  Continue to check blood glucose every OTHER day - notify office if blood glucose > 180.  CKD - encouraged patient to call [facility] for appointment.  Hyperlipidemia - due to recheck lipids in next few months. Continue rosuvastatin '40mg'$  daily.  Health Maintenance - reminded patient to get annual flu vaccine and updated COVID - he endorsed that he plans to get at local pharmacy soon.    Follow Up:  Scheduled follow up with PCP for January 2024 - check A1c / lipids / BMET. Will check to see that patient fills rosuvastatin in November 2023.   Cherre Robins, PharmD Clinical Pharmacist [place] [phone]  I have personally reviewed this encounter including the documentation in this note and have collaborated with the care management provider regarding care management and care coordination activities to include development and update  of the comprehensive care plan. I am certifying that I agree with the content of this note and encounter as supervising physician.

## 2021-11-30 ENCOUNTER — Other Ambulatory Visit: Payer: Self-pay | Admitting: Nurse Practitioner

## 2021-11-30 DIAGNOSIS — R972 Elevated prostate specific antigen [PSA]: Secondary | ICD-10-CM

## 2021-11-30 DIAGNOSIS — C61 Malignant neoplasm of prostate: Secondary | ICD-10-CM

## 2021-12-05 ENCOUNTER — Telehealth: Payer: Self-pay | Admitting: Pharmacist

## 2021-12-05 NOTE — Progress Notes (Addendum)
    Chronic Care Management Pharmacy Assistant   Name: Angel Benson  MRN: [id number] DOB: Aug 16, 1950  Made a call to the patient this morning in reference to his Rosuvastatin 40 mg. Wanted to clarify if the patient was taking this medication or not. Rosuvastatin was last filled on 08/14/21 90 ds with [facility] home delivery. Left a message for the patient to return my call on his home and mobile phone.  [place] [phone]      12/05/2021 Reviewed EPIC records - looks like patient has [facility]suvastatin [facility] for 90 days 11/12/2021. See below

## 2021-12-31 ENCOUNTER — Ambulatory Visit
Admission: RE | Admit: 2021-12-31 | Discharge: 2021-12-31 | Disposition: A | Discharge status: Home / Self Care | Payer: Medicare Other | Source: Ambulatory Visit | Attending: Nurse Practitioner | Admitting: Nurse Practitioner

## 2021-12-31 DIAGNOSIS — R972 Elevated prostate specific antigen [PSA]: Secondary | ICD-10-CM

## 2021-12-31 DIAGNOSIS — C61 Malignant neoplasm of prostate: Secondary | ICD-10-CM

## 2021-12-31 MED ORDER — GADOPICLENOL 0.5 MMOL/ML IV SOLN
9.0000 mL | Freq: Once | INTRAVENOUS | Status: AC | PRN
  Administered 2021-12-31: 9 mL via INTRAVENOUS

## 2022-01-12 DIAGNOSIS — R3912 Poor urinary stream: Secondary | ICD-10-CM | POA: Diagnosis not present

## 2022-01-12 DIAGNOSIS — R350 Frequency of micturition: Secondary | ICD-10-CM | POA: Diagnosis not present

## 2022-01-17 ENCOUNTER — Ambulatory Visit (INDEPENDENT_AMBULATORY_CARE_PROVIDER_SITE_OTHER): Payer: Medicare Other | Admitting: *Deleted

## 2022-01-17 ENCOUNTER — Other Ambulatory Visit: Payer: Self-pay | Admitting: Family

## 2022-01-17 DIAGNOSIS — Z0000 Encounter for general adult medical examination without abnormal findings: Secondary | ICD-10-CM

## 2022-01-17 NOTE — Progress Notes (Signed)
Subjective:   Angel Benson is a 72 y.o. male who presents for Medicare Annual/Subsequent preventive examination.  I connected with  Ukiah Trawick on 01/17/22 by an audio enabled telemedicine application and verified that I am speaking with the correct person using two identifiers.  Patient Location: Home  Provider Location: Office/Clinic  I discussed the limitations of evaluation and management by telemedicine. The patient expressed understanding and agreed to proceed.   Review of Systems    Defer to PCP Cardiac Risk Factors include: advanced age (>14mn, >>70women);male gender;diabetes mellitus;dyslipidemia;hypertension     Objective:    There were no vitals filed for this visit. There is no height or weight on file to calculate BMI.     01/17/2022    9:02 AM 12/12/2020    3:59 PM 11/24/2020    9:13 PM 07/14/2020    1:22 PM 09/22/2013   11:51 AM 08/24/2013    2:44 AM 08/24/2013    2:43 AM  Advanced Directives  Does Patient Have a Medical Advance Directive? _0   Patient does not have advance directive;Patient would like information  Would patient like information on creating a medical advance directive? No - Patient declined No - Patient declined No - Patient declined Yes (MAU/Ambulatory/Procedural Areas - Information given)  Advance directive packet given Advance directive packet given  Pre-existing out of facility DNR order (yellow form or pink MOST form)       No    Current Medications (verified) Outpatient Encounter Medications as of 01/17/2022  Medication Sig   aspirin 81 MG tablet Take 81 mg by mouth daily.   cholecalciferol (VITAMIN D3) 25 MCG (1000 UNIT) tablet Take 1,000 Units by mouth daily.   glimepiride (AMARYL) 1 MG tablet TAKE 1 TABLET BY MOUTH WITH BREAKFAST. (Patient not taking: Reported on 10/26/2021)   metoprolol succinate (TOPROL-XL) 50 MG 24 hr tablet TAKE 1 TABLET DAILY WITH OR IMMEDIATELY FOLLOWING A MEAL   Multiple Vitamin (MULTIVITAMIN)  tablet Take 1 tablet by mouth daily.   Netarsudil-Latanoprost (ROCKLATAN) 0.02-0.005 % SOLN Place 1 drop into both eyes at bedtime.   Omega-3 Fatty Acids (FISH OIL) 1000 MG CAPS Take 1 capsule by mouth at bedtime.   rosuvastatin (CRESTOR) 40 MG tablet TAKE 1 TABLET DAILY   SIMBRINZA 1-0.2 % SUSP Place 1 drop into both eyes in the morning and at bedtime.   tamsulosin (FLOMAX) 0.4 MG CAPS capsule TAKE 1 CAPSULE(0.4 MG) BY MOUTH DAILY AFTER SUPPER   timolol (TIMOPTIC) 0.5 % ophthalmic solution Place 1 drop into both eyes every morning.   vitamin B-12 (CYANOCOBALAMIN) 1000 MCG tablet Take 1,000 mcg by mouth daily.   No facility-administered encounter medications on file as of 01/17/2022.    Allergies (verified) Jardiance [empagliflozin]   History: Past Medical History:  Diagnosis Date   Anemia    CAD, multiple vessel    s/p CABG   Cataract    bilateral cataract removed   Chicken pox    Diabetes mellitus without complication ([place]    Type 2   GERD (gastroesophageal reflux disease)    Glaucoma    Hypercholesteremia    Hypertension    Myocardial infarction ([place] 2015   then CABG   Prostate CA ([place] 2022   "watching it" - no sx planned at this time (09/05/2020)   Tuberculosis 1993   received tx post exposure to person with TB   Urinary tract bacterial infections    Vitamin B12 deficiency    Past Surgical  History:  Procedure Laterality Date   CARDIAC CATHETERIZATION     CATARACT EXTRACTION, BILATERAL     CORONARY ARTERY BYPASS GRAFT N/A 08/26/2013   Procedure: CORONARY ARTERY BYPASS GRAFTING (CABG) times five, using left internal mammary artery, right and left greater saphenous vein;  Surgeon: Grace Isaac, MD;  Location: [place];  Service: Open Heart Surgery;  Laterality: N/A;  LIMA-LAD; SEQ SVG-OM1-OM2-OM3; SVG-RCA    INTRAOPERATIVE TRANSESOPHAGEAL ECHOCARDIOGRAM N/A 08/26/2013   Procedure: INTRAOPERATIVE TRANSESOPHAGEAL ECHOCARDIOGRAM;  Surgeon: Grace Isaac, MD;   Location: [place];  Service: Open Heart Surgery;  Laterality: N/A;   Family History  Problem Relation Age of Onset   Diabetes Mother    Alzheimer's disease Mother    Heart disease Father    Diabetes Father    Heart disease Brother    Colon polyps Neg Hx    Colon cancer Neg Hx    Esophageal cancer Neg Hx    Rectal cancer Neg Hx    Stomach cancer Neg Hx    Social History   Socioeconomic History   Marital status: Divorced    Spouse name: Not on file   Number of children: 1   Years of education: 12   Highest education level: Not on file  Occupational History   Occupation: Retired - Physicist, medical  Tobacco Use   Smoking status: Never    Passive exposure: Past   Smokeless tobacco: Never  Vaping Use   Vaping Use: Never used  Substance and Sexual Activity   Alcohol use: No   Drug use: No   Sexual activity: Not on file  Other Topics Concern   Not on file  Social History Narrative   Born in [place] and raised in [place]. Currently resides in an apartment with his sister. Fun: Basketball   Denies religious beliefs effecting healthcare.    Social Determinants of Health   Financial Resource Strain: Low Risk  (01/17/2022)   Overall Financial Resource Strain (CARDIA)    Difficulty of Paying Living Expenses: Not hard at all  Food Insecurity: No Food Insecurity (01/17/2022)   Hunger Vital Sign    Worried About Running Out of Food in the Last Year: Never true    Ran Out of Food in the Last Year: Never true  Transportation Needs: No Transportation Needs (01/17/2022)   PRAPARE - Hydrologist (Medical): No    Lack of Transportation (Non-Medical): No  Physical Activity: Insufficiently Active (01/17/2022)   Exercise Vital Sign    Days of Exercise per Week: 1 day    Minutes of Exercise per Session: 30 min  Stress: No Stress Concern Present (01/17/2022)   [place]    Feeling of Stress : Not at all   Social Connections: Moderately Isolated (01/17/2022)   Social Connection and Isolation Panel [NHANES]    Frequency of Communication with Friends and Family: Three times a week    Frequency of Social Gatherings with Friends and Family: Once a week    Attends Religious Services: More than 4 times per year    Active Member of [employer] or Organizations: No    Attends Music therapist: Never    Marital Status: Divorced    Tobacco Counseling Counseling given: Not Answered   Clinical Intake:  Pre-visit preparation completed: Yes  Pain : No/denies pain  How often do you need to have someone help you when you read instructions, pamphlets, or other written  materials from your doctor or pharmacy?: 1 - Never  Diabetic? Nutrition Risk Assessment:  Has the patient had any N/V/D within the last 2 months?  No  Does the patient have any non-healing wounds?  No  Has the patient had any unintentional weight loss or weight gain?  No   Diabetes:  Is the patient diabetic?  Yes  If diabetic, was a CBG obtained today?  No  Did the patient bring in their glucometer from home?  No  How often do you monitor your CBG's? Every other day.   Financial Strains and Diabetes Management:  Are you having any financial strains with the device, your supplies or your medication? No .  Does the patient want to be seen by Chronic Care Management for management of their diabetes?  No  Would the patient like to be referred to a Nutritionist or for Diabetic Management?  No   Diabetic Exams:  Diabetic Eye Exam: Completed 06/21/21 Diabetic Foot Exam: Overdue, Pt has been advised about the importance in completing this exam. Pt is scheduled for diabetic foot exam on N/a.  Interpreter Needed?: No  Information entered by :: Beatris Ship, [place]   Activities of Daily Living    01/17/2022    9:08 AM  In your present state of health, do you have any difficulty performing the following activities:  Hearing? 0   Vision? 0  Comment wears readers  Difficulty concentrating or making decisions? 0  Walking or climbing stairs? 0  Dressing or bathing? 0  Doing errands, shopping? 0  Preparing Food and eating ? N  Using the Toilet? N  In the past six months, have you accidently leaked urine? N  Do you have problems with loss of bowel control? N  Managing your Medications? N  Managing your Finances? N  Housekeeping or managing your Housekeeping? N    Patient Care Team: [name], [place] as PCP - General (Internal Medicine) Aura Dials, PA-C as Physician Assistant (Physician Assistant)  Indicate any recent Medical Services you may have received from other than [facility] providers in the past year (date may be approximate).     Assessment:   This is a routine wellness examination for [place].  Hearing/Vision screen No results found.  Dietary issues and exercise activities discussed: Current Exercise Habits: Home exercise routine, Type of exercise: calisthenics, Time (Minutes): 30, Frequency (Times/Week): 2, Weekly Exercise (Minutes/Week): 60, Intensity: Moderate, Exercise limited by: None identified   Goals Addressed   None    Depression Screen    01/17/2022    9:08 AM 08/08/2021   10:42 AM 06/02/2021   10:29 AM 07/19/2020   10:36 AM 06/17/2020    9:30 AM 03/14/2020    9:56 AM 08/25/2019   11:35 AM  PHQ 2/9 Scores  PHQ - 2 Score 0 0 0 0 0 0 0    Fall Risk    01/17/2022    9:02 AM 08/08/2021   10:42 AM 06/02/2021   10:29 AM 12/02/2020    3:08 PM 07/19/2020   10:36 AM  Fall Risk   Falls in the past year? 0 0 0 0 0  Number falls in past yr: 0 0 0 0 0  Injury with Fall? 0 0 0 0 0  Risk for fall due to : No Fall Risks  No Fall Risks Impaired balance/gait;Mental status change   Follow up Falls evaluation completed  Falls evaluation completed Falls evaluation completed;Falls prevention discussed Falls evaluation completed  FALL RISK PREVENTION PERTAINING TO THE HOME:  Any stairs  in or around the home? Yes  If so, are there any without handrails? No  Home free of loose throw rugs in walkways, pet beds, electrical cords, etc? Yes  Adequate lighting in your home to reduce risk of falls? Yes   ASSISTIVE DEVICES UTILIZED TO PREVENT FALLS:  Life alert? No  Use of a cane, walker or w/c? No  Grab bars in the bathroom? No  Shower chair or bench in shower? No  Elevated toilet seat or a handicapped toilet? No   TIMED UP AND GO:  Was the test performed?  No, audio visit .    Cognitive Function:        01/17/2022    9:11 AM  6CIT Screen  What Year? 0 points  What month? 0 points  What time? 0 points  Count back from 20 0 points  Months in reverse 0 points  Repeat phrase 0 points  Total Score 0 points    Immunizations Immunization History  Administered Date(s) Administered   Fluad Quad(high Dose 65+) 11/16/2019   Influenza, High Dose Seasonal PF 12/20/2017   PFIZER(Purple Top)SARS-COV-2 Vaccination 03/15/2019, 04/14/2019, 12/15/2019   Pneumococcal Conjugate-13 05/17/2017   Pneumococcal Polysaccharide-23 08/25/2013, 03/14/2020   Tdap 02/01/2014   Zoster Recombinat (Shingrix) 07/24/2017, 11/05/2017    TDAP status: Up to date  Flu Vaccine status: Due, Education has been provided regarding the importance of this vaccine. Advised may receive this vaccine at local pharmacy or Health Dept. Aware to provide a copy of the vaccination record if obtained from local pharmacy or Health Dept. Verbalized acceptance and understanding.  Pneumococcal vaccine status: Up to date  Covid-19 vaccine status: Information provided on how to obtain vaccines.   Qualifies for Shingles Vaccine? Yes   Zostavax completed No   Shingrix Completed?: Yes  Screening Tests Health Maintenance  Topic Date Due   Medicare Annual Wellness (AWV)  Never done   INFLUENZA VACCINE  08/15/2021   FOOT EXAM  01/15/2022   HEMOGLOBIN A1C  03/20/2022   Diabetic kidney evaluation - Urine ACR   06/03/2022   OPHTHALMOLOGY EXAM  06/22/2022   Diabetic kidney evaluation - eGFR measurement  09/20/2022   DTaP/Tdap/Td (2 - Td or Tdap) 02/02/2024   Pneumonia Vaccine 9+ Years old  Completed   Hepatitis C Screening  Completed   Zoster Vaccines- Shingrix  Completed   HPV VACCINES  Aged Out   COLONOSCOPY (Pts 45-63yr Insurance coverage will need to be confirmed)  Discontinued   COVID-19 Vaccine  Discontinued    Health Maintenance  Health Maintenance Due  Topic Date Due   Medicare Annual Wellness (AWV)  Never done   INFLUENZA VACCINE  08/15/2021   FOOT EXAM  01/15/2022    Colorectal cancer screening: Type of screening: Colonoscopy. Completed 09/09/20. Repeat every N/a years  Lung Cancer Screening: (Low Dose CT Chest recommended if Age 70-80years, 30 pack-year currently smoking OR have quit w/in 15years.) does not qualify.   Additional Screening:  Hepatitis C Screening: does qualify; Completed 07/14/20  Vision Screening: Recommended annual ophthalmology exams for early detection of glaucoma and other disorders of the eye. Is the patient up to date with their annual eye exam?  Yes  Who is the provider or what is the name of the office in which the patient attends annual eye exams? [facility]. If pt is not established with a provider, would they like to be referred to a provider to  establish care? No .   Dental Screening: Recommended annual dental exams for proper oral hygiene  Community Resource Referral / Chronic Care Management: CRR required this visit?  No   CCM required this visit?  No      Plan:     I have personally reviewed and noted the following in the patient's chart:   Medical and social history Use of alcohol, tobacco or illicit drugs  Current medications and supplements including opioid prescriptions. Patient is not currently taking opioid prescriptions. Functional ability and status Nutritional status Physical activity Advanced directives List  of other physicians Hospitalizations, surgeries, and ER visits in previous 12 months Vitals Screenings to include cognitive, depression, and falls Referrals and appointments  In addition, I have reviewed and discussed with patient certain preventive protocols, quality metrics, and best practice recommendations. A written personalized care plan for preventive services as well as general preventive health recommendations were provided to patient.   Due to this being a telephonic visit, the after visit summary with patients personalized plan was offered to patient via mail or my-chart. Per request, patient was mailed a copy of AVS.   Beatris Ship, [place]   01/17/2022   Nurse Notes: None

## 2022-01-17 NOTE — Patient Instructions (Signed)
Mr. Angel Benson , Thank you for taking time to come for your Medicare Wellness Visit. I appreciate your ongoing commitment to your health goals. Please review the following plan we discussed and let me know if I can assist you in the future.   These are the goals we discussed:  Goals   None     This is a list of the screening recommended for you and due dates:  Health Maintenance  Topic Date Due   Flu Shot  08/15/2021   Complete foot exam   01/15/2022   Hemoglobin A1C  03/20/2022   Yearly kidney health urinalysis for diabetes  06/03/2022   Eye exam for diabetics  06/22/2022   Yearly kidney function blood test for diabetes  09/20/2022   Medicare Annual Wellness Visit  01/18/2023   DTaP/Tdap/Td vaccine (2 - Td or Tdap) 02/02/2024   Pneumonia Vaccine  Completed   Hepatitis C Screening: USPSTF Recommendation to screen - Ages 32-79 yo.  Completed   Zoster (Shingles) Vaccine  Completed   HPV Vaccine  Aged Out   Colon Cancer Screening  Discontinued   COVID-19 Vaccine  Discontinued     Next appointment: Follow up in one year for your annual wellness visit.   Preventive Care 26 Years and Older, Male Preventive care refers to lifestyle choices and visits with your health care provider that can promote health and wellness. What does preventive care include? A yearly physical exam. This is also called an annual well check. Dental exams once or twice a year. Routine eye exams. Ask your health care provider how often you should have your eyes checked. Personal lifestyle choices, including: Daily care of your teeth and gums. Regular physical activity. Eating a healthy diet. Avoiding tobacco and drug use. Limiting alcohol use. Practicing safe sex. Taking low doses of aspirin every day. Taking vitamin and mineral supplements as recommended by your health care provider. What happens during an annual well check? The services and screenings done by your health care provider during your annual  well check will depend on your age, overall health, lifestyle risk factors, and family history of disease. Counseling  Your health care provider may ask you questions about your: Alcohol use. Tobacco use. Drug use. Emotional well-being. Home and relationship well-being. Sexual activity. Eating habits. History of falls. Memory and ability to understand (cognition). Work and work Statistician. Screening  You may have the following tests or measurements: Height, weight, and BMI. Blood pressure. Lipid and cholesterol levels. These may be checked every 5 years, or more frequently if you are over 58 years old. Skin check. Lung cancer screening. You may have this screening every year starting at age 80 if you have a 30-pack-year history of smoking and currently smoke or have quit within the past 15 years. Fecal occult blood test (FOBT) of the stool. You may have this test every year starting at age 101. Flexible sigmoidoscopy or colonoscopy. You may have a sigmoidoscopy every 5 years or a colonoscopy every 10 years starting at age 38. Prostate cancer screening. Recommendations will vary depending on your family history and other risks. Hepatitis C blood test. Hepatitis B blood test. Sexually transmitted disease (STD) testing. Diabetes screening. This is done by checking your blood sugar (glucose) after you have not eaten for a while (fasting). You may have this done every 1-3 years. Abdominal aortic aneurysm (AAA) screening. You may need this if you are a current or former smoker. Osteoporosis. You may be screened starting at age 2  if you are at high risk. Talk with your health care provider about your test results, treatment options, and if necessary, the need for more tests. Vaccines  Your health care provider may recommend certain vaccines, such as: Influenza vaccine. This is recommended every year. Tetanus, diphtheria, and acellular pertussis (Tdap, Td) vaccine. You may need a Td booster  every 10 years. Zoster vaccine. You may need this after age 70. Pneumococcal 13-valent conjugate (PCV13) vaccine. One dose is recommended after age 27. Pneumococcal polysaccharide (PPSV23) vaccine. One dose is recommended after age 14. Talk to your health care provider about which screenings and vaccines you need and how often you need them. This information is not intended to replace advice given to you by your health care provider. Make sure you discuss any questions you have with your health care provider. Document Released: 01/28/2015 Document Revised: 09/21/2015 Document Reviewed: 11/02/2014 Elsevier Interactive Patient Education  2017 [place] Prevention in the Home Falls can cause injuries. They can happen to people of all ages. There are many things you can do to make your home safe and to help prevent falls. What can I do on the outside of my home? Regularly fix the edges of walkways and driveways and fix any cracks. Remove anything that might make you trip as you walk through a door, such as a raised step or threshold. Trim any bushes or trees on the path to your home. Use bright outdoor lighting. Clear any walking paths of anything that might make someone trip, such as rocks or tools. Regularly check to see if handrails are loose or broken. Make sure that both sides of any steps have handrails. Any raised decks and porches should have guardrails on the edges. Have any leaves, snow, or ice cleared regularly. Use sand or salt on walking paths during winter. Clean up any spills in your garage right away. This includes oil or grease spills. What can I do in the bathroom? Use night lights. Install grab bars by the toilet and in the tub and shower. Do not use towel bars as grab bars. Use non-skid mats or decals in the tub or shower. If you need to sit down in the shower, use a plastic, non-slip stool. Keep the floor dry. Clean up any water that spills on the floor as soon  as it happens. Remove soap buildup in the tub or shower regularly. Attach bath mats securely with double-sided non-slip rug tape. Do not have throw rugs and other things on the floor that can make you trip. What can I do in the bedroom? Use night lights. Make sure that you have a light by your bed that is easy to reach. Do not use any sheets or blankets that are too big for your bed. They should not hang down onto the floor. Have a firm chair that has side arms. You can use this for support while you get dressed. Do not have throw rugs and other things on the floor that can make you trip. What can I do in the kitchen? Clean up any spills right away. Avoid walking on wet floors. Keep items that you use a lot in easy-to-reach places. If you need to reach something above you, use a strong step stool that has a grab bar. Keep electrical cords out of the way. Do not use floor polish or wax that makes floors slippery. If you must use wax, use non-skid floor wax. Do not have throw rugs and other things  on the floor that can make you trip. What can I do with my stairs? Do not leave any items on the stairs. Make sure that there are handrails on both sides of the stairs and use them. Fix handrails that are broken or loose. Make sure that handrails are as long as the stairways. Check any carpeting to make sure that it is firmly attached to the stairs. Fix any carpet that is loose or worn. Avoid having throw rugs at the top or bottom of the stairs. If you do have throw rugs, attach them to the floor with carpet tape. Make sure that you have a light switch at the top of the stairs and the bottom of the stairs. If you do not have them, ask someone to add them for you. What else can I do to help prevent falls? Wear shoes that: Do not have high heels. Have rubber bottoms. Are comfortable and fit you well. Are closed at the toe. Do not wear sandals. If you use a stepladder: Make sure that it is fully  opened. Do not climb a closed stepladder. Make sure that both sides of the stepladder are locked into place. Ask someone to hold it for you, if possible. Clearly mark and make sure that you can see: Any grab bars or handrails. First and last steps. Where the edge of each step is. Use tools that help you move around (mobility aids) if they are needed. These include: Canes. Walkers. Scooters. Crutches. Turn on the lights when you go into a dark area. Replace any light bulbs as soon as they burn out. Set up your furniture so you have a clear path. Avoid moving your furniture around. If any of your floors are uneven, fix them. If there are any pets around you, be aware of where they are. Review your medicines with your doctor. Some medicines can make you feel dizzy. This can increase your chance of falling. Ask your doctor what other things that you can do to help prevent falls. This information is not intended to replace advice given to you by your health care provider. Make sure you discuss any questions you have with your health care provider. Document Released: 10/28/2008 Document Revised: 06/09/2015 Document Reviewed: 02/05/2014 Elsevier Interactive Patient Education  2017 [employer].

## 2022-01-30 ENCOUNTER — Other Ambulatory Visit: Payer: Self-pay

## 2022-01-30 ENCOUNTER — Encounter: Payer: Self-pay | Admitting: Family

## 2022-01-30 ENCOUNTER — Ambulatory Visit (INDEPENDENT_AMBULATORY_CARE_PROVIDER_SITE_OTHER): Payer: Medicare Other | Admitting: Family

## 2022-01-30 VITALS — BP 128/72 | Temp 98.4°F | Resp 18 | Ht 69.0 in | Wt 182.6 lb

## 2022-01-30 DIAGNOSIS — E1122 Type 2 diabetes mellitus with diabetic chronic kidney disease: Secondary | ICD-10-CM | POA: Diagnosis not present

## 2022-01-30 DIAGNOSIS — S91109A Unspecified open wound of unspecified toe(s) without damage to nail, initial encounter: Secondary | ICD-10-CM

## 2022-01-30 DIAGNOSIS — I2510 Atherosclerotic heart disease of native coronary artery without angina pectoris: Secondary | ICD-10-CM | POA: Diagnosis not present

## 2022-01-30 DIAGNOSIS — N1831 Chronic kidney disease, stage 3a: Secondary | ICD-10-CM | POA: Diagnosis not present

## 2022-01-30 DIAGNOSIS — C61 Malignant neoplasm of prostate: Secondary | ICD-10-CM

## 2022-01-30 LAB — CBC WITH DIFFERENTIAL/PLATELET
Basophils Absolute: 0.1 10*3/uL (ref 0.0–0.1)
Basophils Relative: 0.7 % (ref 0.0–3.0)
Eosinophils Absolute: 0.3 10*3/uL (ref 0.0–0.7)
Eosinophils Relative: 3.7 % (ref 0.0–5.0)
HCT: 45.4 % (ref 39.0–52.0)
Hemoglobin: 14.8 g/dL (ref 13.0–17.0)
Lymphocytes Relative: 25 % (ref 12.0–46.0)
Lymphs Abs: 1.8 10*3/uL (ref 0.7–4.0)
MCHC: 32.6 g/dL (ref 30.0–36.0)
MCV: 86.2 fl (ref 78.0–100.0)
Monocytes Absolute: 0.7 10*3/uL (ref 0.1–1.0)
Monocytes Relative: 9.1 % (ref 3.0–12.0)
Neutro Abs: 4.5 10*3/uL (ref 1.4–7.7)
Neutrophils Relative %: 61.5 % (ref 43.0–77.0)
Platelets: 135 10*3/uL — ABNORMAL LOW (ref 150.0–400.0)
RBC: 5.27 Mil/uL (ref 4.22–5.81)
RDW: 14.1 % (ref 11.5–15.5)
WBC: 7.3 10*3/uL (ref 4.0–10.5)

## 2022-01-30 LAB — COMPREHENSIVE METABOLIC PANEL
ALT: 20 U/L (ref 0–53)
AST: 17 U/L (ref 0–37)
Albumin: 4.4 g/dL (ref 3.5–5.2)
Alkaline Phosphatase: 64 U/L (ref 39–117)
BUN: 23 mg/dL (ref 6–23)
CO2: 29 mEq/L (ref 19–32)
Calcium: 9.6 mg/dL (ref 8.4–10.5)
Chloride: 105 mEq/L (ref 96–112)
Creatinine, Ser: 1.85 mg/dL — ABNORMAL HIGH (ref 0.40–1.50)
GFR: 36.57 mL/min — ABNORMAL LOW (ref >60.00)
Glucose, Bld: 154 mg/dL — ABNORMAL HIGH (ref 70–99)
Potassium: 4.7 mEq/L (ref 3.5–5.1)
Sodium: 142 mEq/L (ref 135–145)
Total Bilirubin: 0.6 mg/dL (ref 0.2–1.2)
Total Protein: 7.2 g/dL (ref 6.0–8.3)

## 2022-01-30 LAB — HEMOGLOBIN A1C: Hgb A1c MFr Bld: 8.7 % — ABNORMAL HIGH (ref 4.6–6.5)

## 2022-01-30 MED ORDER — METOPROLOL SUCCINATE ER 50 MG PO TB24
ORAL_TABLET | ORAL | 3 refills | Status: DC
Start: 2022-01-30 — End: 2022-01-30

## 2022-01-30 MED ORDER — DOXYCYCLINE HYCLATE 100 MG PO TABS: 100.0000 mg | ORAL_TABLET | Freq: Two times a day (BID) | ORAL | 0 refills | Status: DC

## 2022-01-30 MED ORDER — ROSUVASTATIN CALCIUM 40 MG PO TABS: 40.0000 mg | ORAL_TABLET | Freq: Every day | ORAL | 3 refills | Status: DC

## 2022-01-30 MED ORDER — TAMSULOSIN HCL 0.4 MG PO CAPS: ORAL_CAPSULE | ORAL | 3 refills | Status: DC

## 2022-01-30 MED ORDER — METOPROLOL SUCCINATE ER 50 MG PO TB24: 50.0000 mg | ORAL_TABLET | Freq: Every day | ORAL | 3 refills | Status: DC

## 2022-01-30 NOTE — Progress Notes (Signed)
Angel Benson is a 72 y.o. male with the following history as recorded in EpicCare:  Patient Active Problem List   Diagnosis Date Noted   Prostate cancer ([place]) 01/30/2022   Hematochezia    Decreased hemoglobin    Seizure-like activity ([place]) 11/24/2020   Hypertension associated with diabetes ([place]) 11/24/2020   Elevated CK 11/24/2020   CKD (chronic kidney disease), stage III ([place]) 11/24/2020   Left nephrolithiasis 11/24/2020   Leukocytosis 11/24/2020   B12 deficiency 07/14/2020   Anemia 07/14/2020   Thrombocytopenia ([place]) 07/14/2020   Cardiomyopathy, ischemic 09/27/2014   Routine general medical examination at a health care facility 02/01/2014   S/P CABG x 5 08/26/2013   Hyperlipidemia associated with type 2 diabetes mellitus ([place]) 08/25/2013   Coronary artery disease involving native coronary artery of native heart without angina pectoris 08/25/2013   Benign essential HTN 08/25/2013   Type 2 diabetes mellitus with chronic kidney disease, without long-term current use of insulin ([place]) 08/25/2013   NSTEMI (non-ST elevated myocardial infarction) ([place]) 08/24/2013    Current Outpatient Medications  Medication Sig Dispense Refill   aspirin 81 MG tablet Take 81 mg by mouth daily.     cholecalciferol (VITAMIN D3) 25 MCG (1000 UNIT) tablet Take 1,000 Units by mouth daily.     doxycycline (VIBRA-TABS) 100 MG tablet Take 1 tablet (100 mg total) by mouth 2 (two) times daily. 14 tablet 0   Multiple Vitamin (MULTIVITAMIN) tablet Take 1 tablet by mouth daily.     Netarsudil-Latanoprost (ROCKLATAN) 0.02-0.005 % SOLN Place 1 drop into both eyes at bedtime.     Omega-3 Fatty Acids (FISH OIL) 1000 MG CAPS Take 1 capsule by mouth at bedtime.     SIMBRINZA 1-0.2 % SUSP Place 1 drop into both eyes in the morning and at bedtime.     timolol (TIMOPTIC) 0.5 % ophthalmic solution Place 1 drop into both eyes every morning.     vitamin B-12 (CYANOCOBALAMIN) 1000 MCG tablet Take 1,000 mcg by mouth daily.      glimepiride (AMARYL) 1 MG tablet TAKE 1 TABLET BY MOUTH WITH BREAKFAST. (Patient not taking: Reported on 10/26/2021) 90 tablet 1   metoprolol succinate (TOPROL-XL) 50 MG 24 hr tablet Take with or immediately following a meal. 90 tablet 3   rosuvastatin (CRESTOR) 40 MG tablet Take 1 tablet (40 mg total) by mouth daily. 90 tablet 3   tamsulosin (FLOMAX) 0.4 MG CAPS capsule TAKE 1 CAPSULE(0.4 MG) BY MOUTH DAILY AFTER SUPPER 90 capsule 3   No current facility-administered medications for this visit.    Allergies: Jardiance [empagliflozin]  Past Medical History:  Diagnosis Date   Anemia    CAD, multiple vessel    s/p CABG   Cataract    bilateral cataract removed   Chicken pox    Diabetes mellitus without complication ([place])    Type 2   GERD (gastroesophageal reflux disease)    Glaucoma    Hypercholesteremia    Hypertension    Myocardial infarction ([place]) 2015   then CABG   Prostate CA ([facility]) 2022   "watching it" - no sx planned at this time (09/05/2020)   Tuberculosis 1993   received tx post exposure to person with TB   Urinary tract bacterial infections    Vitamin B12 deficiency     Past Surgical History:  Procedure Laterality Date   CARDIAC CATHETERIZATION     CATARACT EXTRACTION, BILATERAL     CORONARY ARTERY BYPASS GRAFT N/A 08/26/2013   Procedure: CORONARY ARTERY  BYPASS GRAFTING (CABG) times five, using left internal mammary artery, right and left greater saphenous vein;  Surgeon: Grace Isaac, MD;  Location: [facility];  Service: Open Heart Surgery;  Laterality: N/A;  LIMA-LAD; SEQ SVG-OM1-OM2-OM3; SVG-RCA    INTRAOPERATIVE TRANSESOPHAGEAL ECHOCARDIOGRAM N/A 08/26/2013   Procedure: INTRAOPERATIVE TRANSESOPHAGEAL ECHOCARDIOGRAM;  Surgeon: Grace Isaac, MD;  Location: [facility];  Service: Open Heart Surgery;  Laterality: N/A;    Family History  Problem Relation Age of Onset   Diabetes Mother    Alzheimer's disease Mother    Heart disease Father    Diabetes Father     Heart disease Brother    Colon polyps Neg Hx    Colon cancer Neg Hx    Esophageal cancer Neg Hx    Rectal cancer Neg Hx    Stomach cancer Neg Hx     Social History   Tobacco Use   Smoking status: Never    Passive exposure: Past   Smokeless tobacco: Never  Substance Use Topics   Alcohol use: No    Subjective:   Follow up on Type 2 Diabetes- has not been taking his Amaryl but notes that his sugars have been higher since Christmas;  On 12/28, blood sugar was 253; admits due to eating too much Christmas candy;  Notes that fasting sugar yesterday was 140;  Does see ophthalmology regularly for management of glaucoma- notes that pressure was up slightly at last [place] in October;  Considering to get RSV vaccine at later date;     Objective:  Vitals:   01/30/22 0946  BP: 128/72  Resp: 18  Temp: 98.4 F (36.9 C)  TempSrc: Oral  Weight: 182 lb 9.6 oz (82.8 kg)  Height: '5\' 9"'$  (1.753 m)    General: Well developed, well nourished, in no acute distress  Skin : Warm and dry. Small scabbed lesion noted on bottom of 3rd toe left foot Head: Normocephalic and atraumatic  Eyes: Sclera and conjunctiva clear; pupils round and reactive to light; extraocular movements intact  Ears: External normal; canals clear; tympanic membranes normal  Oropharynx: Pink, supple. No suspicious lesions  Neck: Supple without thyromegaly, adenopathy  Lungs: Respirations unlabored; clear to auscultation bilaterally without wheeze, rales, rhonchi  CVS exam: normal rate and regular rhythm.  Neurologic: Alert and oriented; speech intact; face symmetrical; moves all extremities well; CNII-XII intact without focal deficit   Assessment:  1. Open wound of toe, initial encounter   2. Type 2 diabetes mellitus with chronic kidney disease, without long-term current use of insulin, unspecified CKD stage ([place])   3. Coronary artery disease involving native coronary artery of native heart without angina pectoris   4. Stage 3a  chronic kidney disease ([place])   5. Prostate cancer ([place])     Plan:  Rx for Doxycycline 100 mg bid x 7 days; refer to podiatrist; Update labs today- has not been taking any medication; to consider short term use of Amaryl; Continue with Dr. Debara Pickett as scheduled; Scheduled to see nephrology next month; Per patient, most recent MRI was stable; continue with urology as scheduled;   No follow-ups on file.  Orders Placed This Encounter  Procedures   CBC with Differential/Platelet   Comp Met (CMET)   Hemoglobin A1c   Ambulatory referral to Podiatry    Referral Priority:   Routine    Referral Type:   Consultation    Referral Reason:   Specialty Services Required    Requested Specialty:   Podiatry  Number of Visits Requested:   1    Requested Prescriptions   Signed Prescriptions Disp Refills   metoprolol succinate (TOPROL-XL) 50 MG 24 hr tablet 90 tablet 3    Sig: Take with or immediately following a meal.   rosuvastatin (CRESTOR) 40 MG tablet 90 tablet 3    Sig: Take 1 tablet (40 mg total) by mouth daily.   tamsulosin (FLOMAX) 0.4 MG CAPS capsule 90 capsule 3    Sig: TAKE 1 CAPSULE(0.4 MG) BY MOUTH DAILY AFTER SUPPER   doxycycline (VIBRA-TABS) 100 MG tablet 14 tablet 0    Sig: Take 1 tablet (100 mg total) by mouth 2 (two) times daily.

## 2022-01-31 ENCOUNTER — Encounter: Payer: Self-pay | Admitting: Physician Assistant

## 2022-02-06 ENCOUNTER — Encounter: Payer: Self-pay | Admitting: Podiatry

## 2022-02-06 ENCOUNTER — Ambulatory Visit (INDEPENDENT_AMBULATORY_CARE_PROVIDER_SITE_OTHER): Payer: Medicare Other | Admitting: Podiatry

## 2022-02-06 ENCOUNTER — Ambulatory Visit (INDEPENDENT_AMBULATORY_CARE_PROVIDER_SITE_OTHER): Payer: Medicare Other

## 2022-02-06 ENCOUNTER — Other Ambulatory Visit: Payer: Self-pay | Admitting: *Deleted

## 2022-02-06 DIAGNOSIS — M2041 Other hammer toe(s) (acquired), right foot: Secondary | ICD-10-CM | POA: Diagnosis not present

## 2022-02-06 DIAGNOSIS — D169 Benign neoplasm of bone and articular cartilage, unspecified: Secondary | ICD-10-CM

## 2022-02-06 NOTE — Progress Notes (Signed)
  Subjective:  Patient ID: Angel Benson, male    DOB: August 14, 1950,  MRN: [id number]  Chief Complaint  Patient presents with   Diabetic Ulcer    Open wound of toe, initial encounter Referring Provider: Jodi Mourning WOODRUFF Diabetes, CKD    72 y.o. male presents with the above complaint. History confirmed with patient.  He states there is always a hard lump at the tip of the toe  Objective:  Physical Exam: warm, good capillary refill, no trophic changes or ulcerative lesions, normal DP and PT pulses, normal sensory exam, and left foot hard tender area with overlying callus.   Radiographs: Multiple views x-ray of the left foot: Small osteophyte noted distal tip of second toe distal phalanx likely osteochondroma Assessment:   1. Osteochondroma   2. Hammertoe of right foot      Plan:  Patient was evaluated and treated and all questions answered.  We reviewed his radiographs.  We discussed the presence of the likely benign bone tumor and that this is the reason for the area of pressure on the skin.  We discussed that this is likely been slowly growing and surgical excision may offer some relief.  I think with his comorbidities this is likely to provide more risk than benefit.  I advised to continue to monitor offload the lesion with silicone pads which I dispensed.  He will return to see me as needed if it worsens and we need to consider surgical correction.  Currently no ulceration that requires active wound care.  No follow-ups on file.

## 2022-02-12 ENCOUNTER — Encounter: Payer: Self-pay | Admitting: Physician Assistant

## 2022-02-14 ENCOUNTER — Ambulatory Visit: Payer: Medicare Other | Admitting: Pharmacist

## 2022-02-14 DIAGNOSIS — N1831 Chronic kidney disease, stage 3a: Secondary | ICD-10-CM

## 2022-02-14 DIAGNOSIS — E785 Hyperlipidemia, unspecified: Secondary | ICD-10-CM

## 2022-02-14 DIAGNOSIS — E1122 Type 2 diabetes mellitus with diabetic chronic kidney disease: Secondary | ICD-10-CM

## 2022-02-14 MED ORDER — GLIMEPIRIDE 1 MG PO TABS: ORAL_TABLET | ORAL | 0 refills | Status: DC

## 2022-02-14 NOTE — Progress Notes (Addendum)
Pharmacy Note  02/14/2022 Name: Augusta Hilbert MRN: [id number] DOB: September 07, 1950  Subjective: Angel Benson is a 72 y.o. year old male who is a primary care patient of [name], [place]. Clinical Pharmacist Practitioner referral was placed to assist with medication and diabetes management.    Engaged with patient by telephone for follow up visit today.  CKD - recent Scr had showed slight improvement. Patient has not seen nephrology / [facility] yet but reports he has an appointment 02/26/2022 for initial consult.  Patient has taken Jardiance in past but he stopped due to urinary infections / increase in urination.  We have discussed trial of Farxiga but patient declined due to cost / side effect concerns.   Diabetes: Home blood glucose readings have improved since restarting glimepiride '1mg'$  daily.  Checks blood glucose every other day Blood glucose prior to restart (fasting in am): 185, 177, 136, 148, 161, 157, 145, 160 Blood glucose after restarting glimepiride: 149, 117, 158, 136, 116, 123,122 Patient denies symptoms of hypoglycemia or hyperglycemia Previous medications tried: metformin - stopped due to increasing Scr; developed UTI with Jardiance; Wilder Glade was non formulary / too expensive.  Had planned to try Lantus but never started due to blood glucose of 117 in office so insulin was not started.     Hyperlipidemia: LD goal < 70. Patient is taking rosuvastatin '40mg'$  daily - last filled 90 Days on 01/30/2022. Patient endorses that he is taking rosuvastatin every day. Last LDL was not at goal @ 128. Patient saw cardiologist 10/20/2021.    Objective: Review of patient status, including review of consultants reports, laboratory and other test data, was performed as part of comprehensive evaluation and provision of chronic care management services.   Lab Results  Component Value Date   CREATININE 1.85 (H) 01/30/2022   CREATININE 1.75 (H) 09/19/2021    CREATININE 1.84 (H) 08/08/2021    Lab Results  Component Value Date   HGBA1C 8.7 (H) 01/30/2022       Component Value Date/Time   CHOL 191 06/02/2021 1100   TRIG 75.0 06/02/2021 1100   HDL 47.40 06/02/2021 1100   CHOLHDL 4 06/02/2021 1100   VLDL 15.0 06/02/2021 1100   LDLCALC 128 (H) 06/02/2021 1100   LDLCALC 69 08/25/2019 1140     Clinical ASCVD: Yes  The ASCVD Risk score (Arnett DK, et al., 2019) failed to calculate for the following reasons:   The patient has a prior MI or stroke diagnosis    BP Readings from Last 3 Encounters:  01/30/22 128/72  10/20/21 134/70  08/08/21 140/76     Allergies  Allergen Reactions   Jardiance [Empagliflozin] Other (See Comments)    UTI    Medications Reviewed Today     Reviewed by Cherre Robins, RPH-CPP (Pharmacist) on 02/14/22 at 1107  Med List Status: <None>   Medication Order Taking? Sig Documenting Provider Last Dose Status Informant  aspirin 81 MG tablet [id number] Yes Take 81 mg by mouth daily. [provider] Taking Active Self  cholecalciferol (VITAMIN D3) 25 MCG (1000 UNIT) tablet [id number] Yes Take 1,000 Units by mouth daily. [provider] Taking Active Self  glimepiride (AMARYL) 1 MG tablet [id number] Yes TAKE 1 TABLET BY MOUTH WITH BREAKFAST. [name], FNP Taking Active   metoprolol succinate (TOPROL-XL) 50 MG 24 hr tablet [id number] Yes Take 1 tablet (50 mg total) by mouth daily. Take with or immediately following a meal. Valere Dross Marvis Repress, FNP Taking Active  Multiple Vitamin (MULTIVITAMIN) tablet [id number] Yes Take 1 tablet by mouth daily. [provider] Taking Active Self  Netarsudil-Latanoprost (ROCKLATAN) 0.02-0.005 % SOLN [id number] Yes Place 1 drop into both eyes at bedtime. [provider] Taking Active Self  Omega-3 Fatty Acids (FISH OIL) 1000 MG CAPS [id number] Yes Take 1 capsule by mouth at bedtime. [provider] Taking Active Self  rosuvastatin  (CRESTOR) 40 MG tablet [id number] Yes Take 1 tablet (40 mg total) by mouth daily. [name], FNP Taking Active   SIMBRINZA 1-0.2 % SUSP [id number] Yes Place 1 drop into both eyes in the morning and at bedtime. [provider] Taking Active Self  tamsulosin (FLOMAX) 0.4 MG CAPS capsule [id number] Yes TAKE 1 CAPSULE(0.4 MG) BY MOUTH DAILY AFTER SUPPER [name], FNP Taking Active   timolol (TIMOPTIC) 0.5 % ophthalmic solution [id number] Yes Place 1 drop into both eyes every morning. [provider] Taking Active Self  vitamin B-12 (CYANOCOBALAMIN) 1000 MCG tablet [id number] Yes Take 1,000 mcg by mouth daily. [provider] Taking Active             Patient Active Problem List   Diagnosis Date Noted   Prostate cancer ([place]) 01/30/2022   Hematochezia    Decreased hemoglobin    Seizure-like activity ([place]) 11/24/2020   Hypertension associated with diabetes ([place]) 11/24/2020   Elevated CK 11/24/2020   CKD (chronic kidney disease), stage III ([place]) 11/24/2020   Left nephrolithiasis 11/24/2020   Leukocytosis 11/24/2020   B12 deficiency 07/14/2020   Anemia 07/14/2020   Thrombocytopenia ([place]) 07/14/2020   Cardiomyopathy, ischemic 09/27/2014   Routine general medical examination at a health care facility 02/01/2014   S/P CABG x 5 08/26/2013   Hyperlipidemia associated with type 2 diabetes mellitus ([place]) 08/25/2013   Coronary artery disease involving native coronary artery of native heart without angina pectoris 08/25/2013   Benign essential HTN 08/25/2013   Type 2 diabetes mellitus with chronic kidney disease, without long-term current use of insulin ([place]) 08/25/2013   NSTEMI (non-ST elevated myocardial infarction) ([place]) 08/24/2013     Medication Assistance:  None required.  Patient affirms current coverage meets needs.   Assessment / Plan:  Type 2 DM - blood glucose improving since restarted glimepiride Continue glimepiride '1mg'$   daily Reviewed signs and symptoms of hypoglycemia and how to treat.  Recommended continue to check blood glucose every other day but encouraged patient to take 1 or 2 per week after breakfast  CKD - Continue as planned to see nephrologist 02/26/2022  Might need to reassess trial of SGLT2  Hyperlipidemia -  Continue rosuvastatin '40mg'$  daily.  Continue to follow up with Dr Debara Pickett  Medication Management:  Assisted patient in requesting refills for Rocklatan and Simbrinze eye drops from [facility]. Also requested updated Rx from Dr Azucena Fallen office for timolol eye drops  Follow Up:   1 month to check blood glucose and medication adherence.   Cherre Robins, PharmD Clinical Pharmacist [place] [phone]  I have personally reviewed this encounter including the documentation in this note and have collaborated with the care management provider regarding care management and care coordination activities to include development and update of the comprehensive care plan. I am certifying that I agree with the content of this note and encounter as supervising physician.

## 2022-02-16 NOTE — Patient Instructions (Signed)
Angel Benson was a pleasure speaking with you today.  Below is a summary of our recent visit.   Continue to check blood glucose once or twice a day. Try to get a few readings 1 or 2 hours after a meal.  Home blood glucose goals   Fasting blood glucose goal (before meals) = 80 to 130 Blood glucose goal after a meal = less than 180   See below for information on how to treat blood glucose if Benson gets too low (less than 70)    As always if you have any questions or concerns especially regarding medications, please feel free to contact me either at the phone number below or with a MyChart message.   Keep up the good work!  Cherre Robins, PharmD Clinical Pharmacist [facility] [phone] (direct line)  [phone] (main office number)  Hypoglycemia Hypoglycemia occurs when the level of sugar (glucose) in the blood is too low. Glucose is a type of sugar that provides the body's main source of energy. Certain hormones (insulin and glucagon) control the level of glucose in the blood. Insulin lowers blood glucose, and glucagon increases blood glucose. Hypoglycemia can result from having too much insulin in the bloodstream, or from not eating enough food that contains glucose. Hypoglycemia can happen in people who do or do not have diabetes. Benson can develop quickly, and Benson can be a medical emergency. What are the causes? Hypoglycemia occurs most often in people who have diabetes. If you have diabetes, hypoglycemia may be caused by: Diabetes medicine. More likely to occur with insulin. Not eating enough, or not eating often enough. Increased physical activity. Drinking alcohol, especially when you have not eaten recently. What increases the risk? Hypoglycemia is more likely to develop in: People who have diabetes and take medicines to lower blood glucose. People who abuse alcohol. People who have a severe illness. What are the signs or  symptoms? Hypoglycemia may not cause any symptoms. If you have symptoms, they may include: Hunger. Anxiety. Sweating and feeling clammy. Confusion. Dizziness or feeling light-headed. Sleepiness. Nausea. Increased heart rate. Headache. Blurry vision. Seizure. Nightmares. Tingling or numbness around the mouth, lips, or tongue. A change in speech. Decreased ability to concentrate. A change in coordination. Restless sleep. Tremors or shakes. Fainting. Irritability.  How to treat Low Blood glucose (Blood glucose less than 70)? This condition can often be treated by immediately eating or drinking something that contains glucose / sugar, such as: 3-4 sugar tablets (glucose pills). Glucose gel, 15-gram tube. Fruit juice, 4 oz (120 mL). Regular soda (not diet soda), 4 oz (120 mL). Low-fat milk, 4 oz (120 mL). Several pieces of hard candy. Sugar or honey, 1 Tbsp. Treating Hypoglycemia If You Have Diabetes  If you are alert and able to swallow safely, follow the 15:15 rule: Take 15 grams of a rapid-acting carbohydrate. Rapid-acting options include: 1 tube of glucose gel. 3 glucose pills. 6-8 pieces of hard candy. 4 oz (120 mL) of fruit juice. 4 oz (120 ml) of regular (not diet) soda. Check your blood glucose 15 to 20 minutes after you take the carbohydrate. If the repeat blood glucose level is still at or below 70 mg/dL (3.9 mmol/L), take 15 grams of a carbohydrate again. If your blood glucose level does not increase above 70 mg/dL (3.9 mmol/L) after 3 tries, seek emergency medical care. After your blood glucose level returns to normal, eat a meal or a snack within 1 hour.  Treating Severe Hypoglycemia  Severe hypoglycemia is when your blood glucose level is at or below 54 mg/dL (3 mmol/L). Severe hypoglycemia is an emergency. Do not wait to see if the symptoms will go away. Get medical help right away. Call your local emergency services (911 in the [place] Do not drive yourself to  the hospital. If you have severe hypoglycemia and you cannot eat or drink, you may need an injection of glucagon. A family member or close friend should learn how to check your blood glucose and how to give you a glucagon injection. Ask your health care provider if you need to have an emergency glucagon injection kit available. Severe hypoglycemia may need to be treated in a hospital. The treatment may include getting glucose through an IV tube. You may also need treatment for the cause of your hypoglycemia. Follow these instructions at home: General instructions Avoid any diets that cause you to not eat enough food. Talk with your health care provider before you start any new diet. Take over-the-counter and prescription medicines only as told by your health care provider. Limit alcohol intake to no more than 1 drink per day for nonpregnant women and 2 drinks per day for men. One drink equals 12 oz of beer, 5 oz of wine, or 1 oz of hard liquor. Keep all follow-up visits as told by your health care provider. This is important.  If You Have Diabetes:  Make sure you know the symptoms of hypoglycemia. Always have a rapid-acting carbohydrate snack with you to treat low blood sugar. Follow your diabetes management plan, as told by your health care provider. Make sure you: Take your medicines as directed. Follow your exercise plan. Follow your meal plan. Eat on time, and do not skip meals. Check your blood glucose as often as directed. Make sure to check your blood glucose before and after exercise. If you exercise longer or in a different way than usual, check your blood glucose more often. Follow your sick day plan whenever you cannot eat or drink normally. Make this plan in advance with your health care provider. Share your diabetes management plan with people in your workplace, school, and household. Check your urine for ketones when you are ill and as told by your health care provider. Carry a  medical alert card or wear medical alert jewelry. Contact a health care provider if: You have problems keeping your blood glucose in your target range. You have frequent episodes of hypoglycemia. (more than 1 episode per week) Get help right away if: You continue to have hypoglycemia symptoms after eating or drinking something containing glucose. Your blood glucose is at or below 54 mg/dL (3 mmol/L). You have a seizure. You faint. These symptoms may represent a serious problem that is an emergency. Do not wait to see if the symptoms will go away. Get medical help right away. Call your local emergency services (911 in the [place] Do not drive yourself to the hospital.  This information is not intended to replace advice given to you by your health care provider. Make sure you discuss any questions you have with your health care provider.

## 2022-02-26 DIAGNOSIS — N2581 Secondary hyperparathyroidism of renal origin: Secondary | ICD-10-CM | POA: Diagnosis not present

## 2022-02-26 DIAGNOSIS — N183 Chronic kidney disease, stage 3 unspecified: Secondary | ICD-10-CM | POA: Diagnosis not present

## 2022-02-26 DIAGNOSIS — E1122 Type 2 diabetes mellitus with diabetic chronic kidney disease: Secondary | ICD-10-CM | POA: Diagnosis not present

## 2022-02-26 DIAGNOSIS — N390 Urinary tract infection, site not specified: Secondary | ICD-10-CM | POA: Diagnosis not present

## 2022-02-26 DIAGNOSIS — E785 Hyperlipidemia, unspecified: Secondary | ICD-10-CM | POA: Diagnosis not present

## 2022-02-26 DIAGNOSIS — D631 Anemia in chronic kidney disease: Secondary | ICD-10-CM | POA: Diagnosis not present

## 2022-03-02 ENCOUNTER — Other Ambulatory Visit: Payer: Self-pay | Admitting: Nephrology

## 2022-03-02 DIAGNOSIS — N2581 Secondary hyperparathyroidism of renal origin: Secondary | ICD-10-CM

## 2022-03-02 DIAGNOSIS — N183 Chronic kidney disease, stage 3 unspecified: Secondary | ICD-10-CM

## 2022-03-02 DIAGNOSIS — E1169 Type 2 diabetes mellitus with other specified complication: Secondary | ICD-10-CM

## 2022-03-02 DIAGNOSIS — C61 Malignant neoplasm of prostate: Secondary | ICD-10-CM

## 2022-03-02 DIAGNOSIS — D631 Anemia in chronic kidney disease: Secondary | ICD-10-CM

## 2022-03-02 DIAGNOSIS — I159 Secondary hypertension, unspecified: Secondary | ICD-10-CM

## 2022-03-14 ENCOUNTER — Ambulatory Visit: Payer: Medicare Other | Admitting: Pharmacist

## 2022-03-14 DIAGNOSIS — I2510 Atherosclerotic heart disease of native coronary artery without angina pectoris: Secondary | ICD-10-CM

## 2022-03-14 DIAGNOSIS — E1122 Type 2 diabetes mellitus with diabetic chronic kidney disease: Secondary | ICD-10-CM

## 2022-03-14 DIAGNOSIS — I10 Essential (primary) hypertension: Secondary | ICD-10-CM

## 2022-03-14 NOTE — Progress Notes (Signed)
Pharmacy Note  03/14/2022 Name: Angel Benson MRN: [id number] DOB: 1950/08/08  Subjective: Angel Benson is a 72 y.o. year old male who is a primary care patient of [name], [place]. Clinical Pharmacist Practitioner referral was placed to assist with medication and diabetes management.    Engaged with patient by telephone for follow up visit today.  CKD -  Patient did see nephrology, Dr Candiss Norse at [facility] 02/26/2022 for initial consult. Started losartan '25mg'$  daily (not on med list yet)  Past medications tired - Jardiance stopped due to urinary infections / increase in urination.  We have discussed trial of Farxiga but patient declined due to cost / side effect concerns.   Diabetes:  Current therapy: glimepiride '1mg'$  daily.  Checks blood glucose every other day Blood glucose fasting in am: 123, 142, 108, 108, 137, 11, 104, 121, 119  Post prandial blood glucose: 97, 142, 149, 183, 155 Patient denies symptoms of hypoglycemia or hyperglycemia Previous medications tried: metformin - stopped due to increasing Scr; developed UTI with Jardiance; Wilder Glade was non formulary / too expensive.  Had planned to try Lantus but never started due to blood glucose of 117 in office so insulin was not started.    Hyperlipidemia: LD goal < 70.  Current therapy - rosuvastatin '40mg'$  daily - last filled 90 Days on 01/30/2022. Patient endorses that he is taking rosuvastatin every day. Last LDL was not at goal @ 128. Patient saw Dr Debara Pickett 10/20/2021.    Objective: Review of patient status, including review of consultants reports, laboratory and other test data, was performed as part of comprehensive evaluation and provision of chronic care management services.   Lab Results  Component Value Date   CREATININE 1.85 (H) 01/30/2022   CREATININE 1.75 (H) 09/19/2021   CREATININE 1.84 (H) 08/08/2021    Lab Results  Component Value Date   HGBA1C 8.7 (H) 01/30/2022       Component Value  Date/Time   CHOL 191 06/02/2021 1100   TRIG 75.0 06/02/2021 1100   HDL 47.40 06/02/2021 1100   CHOLHDL 4 06/02/2021 1100   VLDL 15.0 06/02/2021 1100   LDLCALC 128 (H) 06/02/2021 1100   LDLCALC 69 08/25/2019 1140     Clinical ASCVD: Yes  The ASCVD Risk score (Arnett DK, et al., 2019) failed to calculate for the following reasons:   The patient has a prior MI or stroke diagnosis    BP Readings from Last 3 Encounters:  01/30/22 128/72  10/20/21 134/70  08/08/21 140/76     Allergies  Allergen Reactions   Jardiance [Empagliflozin] Other (See Comments)    UTI    Medications Reviewed Today     Reviewed by Cherre Robins, RPH-CPP (Pharmacist) on 02/14/22 at 1107  Med List Status: <None>   Medication Order Taking? Sig Documenting Provider Last Dose Status Informant  aspirin 81 MG tablet [id number] Yes Take 81 mg by mouth daily. [provider] Taking Active Self  cholecalciferol (VITAMIN D3) 25 MCG (1000 UNIT) tablet [id number] Yes Take 1,000 Units by mouth daily. [provider] Taking Active Self  glimepiride (AMARYL) 1 MG tablet [id number] Yes TAKE 1 TABLET BY MOUTH WITH BREAKFAST. [name], FNP Taking Active   metoprolol succinate (TOPROL-XL) 50 MG 24 hr tablet [id number] Yes Take 1 tablet (50 mg total) by mouth daily. Take with or immediately following a meal. [name], FNP Taking Active   Multiple Vitamin (MULTIVITAMIN) tablet [id number] Yes Take 1 tablet by mouth daily. [provider] Taking Active Self  Netarsudil-Latanoprost (ROCKLATAN) 0.02-0.005 % SOLN [id number] Yes Place 1 drop into both eyes at bedtime. [provider] Taking Active Self  Omega-3 Fatty Acids (FISH OIL) 1000 MG CAPS [id number] Yes Take 1 capsule by mouth at bedtime. [provider] Taking Active Self  rosuvastatin (CRESTOR) 40 MG tablet [id number] Yes Take 1 tablet (40 mg total) by mouth daily. [name], FNP Taking Active    SIMBRINZA 1-0.2 % SUSP [id number] Yes Place 1 drop into both eyes in the morning and at bedtime. [provider] Taking Active Self  tamsulosin (FLOMAX) 0.4 MG CAPS capsule [id number] Yes TAKE 1 CAPSULE(0.4 MG) BY MOUTH DAILY AFTER SUPPER [name], FNP Taking Active   timolol (TIMOPTIC) 0.5 % ophthalmic solution [id number] Yes Place 1 drop into both eyes every morning. [provider] Taking Active Self  vitamin B-12 (CYANOCOBALAMIN) 1000 MCG tablet [id number] Yes Take 1,000 mcg by mouth daily. [provider] Taking Active             Patient Active Problem List   Diagnosis Date Noted   Prostate cancer ([place]) 01/30/2022   Hematochezia    Decreased hemoglobin    Seizure-like activity ([place]) 11/24/2020   Hypertension associated with diabetes ([place]) 11/24/2020   Elevated CK 11/24/2020   CKD (chronic kidney disease), stage III ([place]) 11/24/2020   Left nephrolithiasis 11/24/2020   Leukocytosis 11/24/2020   B12 deficiency 07/14/2020   Anemia 07/14/2020   Thrombocytopenia ([place]) 07/14/2020   Cardiomyopathy, ischemic 09/27/2014   Routine general medical examination at a health care facility 02/01/2014   S/P CABG x 5 08/26/2013   Hyperlipidemia associated with type 2 diabetes mellitus ([place]) 08/25/2013   Coronary artery disease involving native coronary artery of native heart without angina pectoris 08/25/2013   Benign essential HTN 08/25/2013   Type 2 diabetes mellitus with chronic kidney disease, without long-term current use of insulin ([place]) 08/25/2013   NSTEMI (non-ST elevated myocardial infarction) ([place]) 08/24/2013     Medication Assistance:  None required.  Patient affirms current coverage meets needs.   Assessment / Plan:  Type 2 DM - blood glucose improving since restarted glimepiride Continue glimepiride '1mg'$  daily Reviewed signs and symptoms of hypoglycemia and how to treat.  Recommended continue to check blood glucose every other day (some  post prandial and some fasting)   CKD / hypertension - blood pressure goal < 130/80 Continue to follow up with nephrology Continue losartan '25mg'$  daily Discussed blood pressure goal Limit sodium intake.    Hyperlipidemia -  Continue rosuvastatin '40mg'$  daily.  Continue to follow up with Dr Debara Pickett  Medication Management:  Reviewed med list and updated to include addition of losartan '25mg'$  daily prescribed 02/26/2021 by Dr Candiss Norse.   Follow Up:  2 to 3 months  Cherre Robins, PharmD Clinical Pharmacist [place] [phone]

## 2022-03-20 DIAGNOSIS — H04123 Dry eye syndrome of bilateral lacrimal glands: Secondary | ICD-10-CM | POA: Diagnosis not present

## 2022-03-20 DIAGNOSIS — E119 Type 2 diabetes mellitus without complications: Secondary | ICD-10-CM | POA: Diagnosis not present

## 2022-03-20 DIAGNOSIS — H401131 Primary open-angle glaucoma, bilateral, mild stage: Secondary | ICD-10-CM | POA: Diagnosis not present

## 2022-03-20 DIAGNOSIS — H0102B Squamous blepharitis left eye, upper and lower eyelids: Secondary | ICD-10-CM | POA: Diagnosis not present

## 2022-03-20 DIAGNOSIS — H0102A Squamous blepharitis right eye, upper and lower eyelids: Secondary | ICD-10-CM | POA: Diagnosis not present

## 2022-03-20 DIAGNOSIS — H43812 Vitreous degeneration, left eye: Secondary | ICD-10-CM | POA: Diagnosis not present

## 2022-03-20 DIAGNOSIS — Z961 Presence of intraocular lens: Secondary | ICD-10-CM | POA: Diagnosis not present

## 2022-03-20 LAB — HM DIABETES EYE EXAM

## 2022-03-22 ENCOUNTER — Other Ambulatory Visit: Payer: Self-pay | Admitting: *Deleted

## 2022-03-22 DIAGNOSIS — E782 Mixed hyperlipidemia: Secondary | ICD-10-CM

## 2022-03-22 DIAGNOSIS — I2510 Atherosclerotic heart disease of native coronary artery without angina pectoris: Secondary | ICD-10-CM

## 2022-03-30 ENCOUNTER — Ambulatory Visit
Admission: RE | Admit: 2022-03-30 | Discharge: 2022-03-30 | Disposition: A | Discharge status: Home / Self Care | Payer: Medicare Other | Source: Ambulatory Visit | Attending: Nephrology | Admitting: Nephrology

## 2022-03-30 DIAGNOSIS — D631 Anemia in chronic kidney disease: Secondary | ICD-10-CM

## 2022-03-30 DIAGNOSIS — I159 Secondary hypertension, unspecified: Secondary | ICD-10-CM

## 2022-03-30 DIAGNOSIS — E1169 Type 2 diabetes mellitus with other specified complication: Secondary | ICD-10-CM

## 2022-03-30 DIAGNOSIS — C61 Malignant neoplasm of prostate: Secondary | ICD-10-CM

## 2022-03-30 DIAGNOSIS — N183 Chronic kidney disease, stage 3 unspecified: Secondary | ICD-10-CM

## 2022-03-30 DIAGNOSIS — N2581 Secondary hyperparathyroidism of renal origin: Secondary | ICD-10-CM

## 2022-03-30 DIAGNOSIS — N189 Chronic kidney disease, unspecified: Secondary | ICD-10-CM | POA: Diagnosis not present

## 2022-04-05 LAB — HEMOGLOBIN A1C: Hemoglobin A1C: 7.3

## 2022-04-09 DIAGNOSIS — N183 Chronic kidney disease, stage 3 unspecified: Secondary | ICD-10-CM | POA: Diagnosis not present

## 2022-04-19 DIAGNOSIS — E782 Mixed hyperlipidemia: Secondary | ICD-10-CM | POA: Diagnosis not present

## 2022-04-19 DIAGNOSIS — I2510 Atherosclerotic heart disease of native coronary artery without angina pectoris: Secondary | ICD-10-CM | POA: Diagnosis not present

## 2022-04-20 LAB — LIPID PANEL
Chol/HDL Ratio: 2.9 ratio (ref 0.0–5.0)
Cholesterol, Total: 131 mg/dL (ref 100–199)
HDL: 45 mg/dL (ref >39)
LDL Chol Calc (NIH): 72 mg/dL (ref 0–99)
Triglycerides: 70 mg/dL (ref 0–149)
VLDL Cholesterol Cal: 14 mg/dL (ref 5–40)

## 2022-04-23 ENCOUNTER — Encounter: Payer: Self-pay | Admitting: Internal Medicine

## 2022-04-26 DIAGNOSIS — H401131 Primary open-angle glaucoma, bilateral, mild stage: Secondary | ICD-10-CM | POA: Diagnosis not present

## 2022-04-27 ENCOUNTER — Other Ambulatory Visit: Payer: Self-pay | Admitting: Internal Medicine

## 2022-05-01 ENCOUNTER — Ambulatory Visit (INDEPENDENT_AMBULATORY_CARE_PROVIDER_SITE_OTHER): Payer: Medicare Other | Admitting: Family

## 2022-05-01 ENCOUNTER — Encounter: Payer: Self-pay | Admitting: Family

## 2022-05-01 VITALS — BP 134/64 | HR 56 | Resp 19 | Ht 69.0 in | Wt 186.0 lb

## 2022-05-01 DIAGNOSIS — E1122 Type 2 diabetes mellitus with diabetic chronic kidney disease: Secondary | ICD-10-CM | POA: Diagnosis not present

## 2022-05-01 DIAGNOSIS — C61 Malignant neoplasm of prostate: Secondary | ICD-10-CM | POA: Diagnosis not present

## 2022-05-01 DIAGNOSIS — I2510 Atherosclerotic heart disease of native coronary artery without angina pectoris: Secondary | ICD-10-CM

## 2022-05-01 DIAGNOSIS — N1832 Chronic kidney disease, stage 3b: Secondary | ICD-10-CM

## 2022-05-01 DIAGNOSIS — N1831 Chronic kidney disease, stage 3a: Secondary | ICD-10-CM

## 2022-05-01 NOTE — Progress Notes (Addendum)
Angel Benson is a 72 y.o. male with the following history as recorded in EpicCare:  Patient Active Problem List   Diagnosis Date Noted   Prostate cancer 01/30/2022   Hematochezia    Decreased hemoglobin    Seizure-like activity 11/24/2020   Hypertension associated with diabetes 11/24/2020   Elevated CK 11/24/2020   CKD (chronic kidney disease), stage III 11/24/2020   Left nephrolithiasis 11/24/2020   Leukocytosis 11/24/2020   B12 deficiency 07/14/2020   Anemia 07/14/2020   Thrombocytopenia 07/14/2020   Cardiomyopathy, ischemic 09/27/2014   Routine general medical examination at a health care facility 02/01/2014   S/P CABG x 5 08/26/2013   Hyperlipidemia associated with type 2 diabetes mellitus 08/25/2013   Coronary artery disease involving native coronary artery of native heart without angina pectoris 08/25/2013   Benign essential HTN 08/25/2013   Type 2 diabetes mellitus with chronic kidney disease, without long-term current use of insulin 08/25/2013   NSTEMI (non-ST elevated myocardial infarction) 08/24/2013    Current Outpatient Medications  Medication Sig Dispense Refill   aspirin 81 MG tablet Take 81 mg by mouth daily.     cholecalciferol (VITAMIN D3) 25 MCG (1000 UNIT) tablet Take 1,000 Units by mouth daily.     glimepiride (AMARYL) 1 MG tablet Take 1 tablet (1 mg total) by mouth daily with breakfast. 90 tablet 0   losartan (COZAAR) 25 MG tablet Take 25 mg by mouth daily.     metoprolol succinate (TOPROL-XL) 50 MG 24 hr tablet Take 1 tablet (50 mg total) by mouth daily. Take with or immediately following a meal. 90 tablet 3   Multiple Vitamin (MULTIVITAMIN) tablet Take 1 tablet by mouth daily.     Netarsudil-Latanoprost (ROCKLATAN) 0.02-0.005 % SOLN Place 1 drop into both eyes at bedtime.     ofloxacin (OCUFLOX) 0.3 % ophthalmic solution 1 drop in the morning, at noon, in the evening, and at bedtime. Ofloxacin and Prednisolone per Dr. Dione Booze     Omega-3 Fatty Acids (FISH  OIL) 1000 MG CAPS Take 1 capsule by mouth at bedtime.     rosuvastatin (CRESTOR) 40 MG tablet Take 1 tablet (40 mg total) by mouth daily. 90 tablet 3   SIMBRINZA 1-0.2 % SUSP Place 1 drop into both eyes in the morning and at bedtime.     tamsulosin (FLOMAX) 0.4 MG CAPS capsule TAKE 1 CAPSULE(0.4 MG) BY MOUTH DAILY AFTER SUPPER 90 capsule 3   timolol (TIMOPTIC) 0.5 % ophthalmic solution Place 1 drop into both eyes every morning.     vitamin B-12 (CYANOCOBALAMIN) 1000 MCG tablet Take 1,000 mcg by mouth daily.     No current facility-administered medications for this visit.    Allergies: Jardiance [empagliflozin]  Past Medical History:  Diagnosis Date   Anemia    CAD, multiple vessel    s/p CABG   Cataract    bilateral cataract removed   Chicken pox    Diabetes mellitus without complication    Type 2   GERD (gastroesophageal reflux disease)    Glaucoma    Hypercholesteremia    Hypertension    Myocardial infarction 2015   then CABG   Prostate CA 2022   "watching it" - no sx planned at this time (09/05/2020)   Tuberculosis 1993   received tx post exposure to person with TB   Urinary tract bacterial infections    Vitamin B12 deficiency     Past Surgical History:  Procedure Laterality Date   CARDIAC CATHETERIZATION  CATARACT EXTRACTION, BILATERAL     CORONARY ARTERY BYPASS GRAFT N/A 08/26/2013   Procedure: CORONARY ARTERY BYPASS GRAFTING (CABG) times five, using left internal mammary artery, right and left greater saphenous vein;  Surgeon: Delight Ovens, MD;  Location: [facility] OR;  Service: Open Heart Surgery;  Laterality: N/A;  LIMA-LAD; SEQ SVG-OM1-OM2-OM3; SVG-RCA    INTRAOPERATIVE TRANSESOPHAGEAL ECHOCARDIOGRAM N/A 08/26/2013   Procedure: INTRAOPERATIVE TRANSESOPHAGEAL ECHOCARDIOGRAM;  Surgeon: Delight Ovens, MD;  Location: [facility] OR;  Service: Open Heart Surgery;  Laterality: N/A;    Family History  Problem Relation Age of Onset   Diabetes Mother    Alzheimer's disease  Mother    Heart disease Father    Diabetes Father    Heart disease Brother    Colon polyps Neg Hx    Colon cancer Neg Hx    Esophageal cancer Neg Hx    Rectal cancer Neg Hx    Stomach cancer Neg Hx     Social History   Tobacco Use   Smoking status: Never    Passive exposure: Past   Smokeless tobacco: Never  Substance Use Topics   Alcohol use: No    Subjective:   Patient presents for 3 month follow up on Type 2 Diabetes; has started back on Amaryl 1 mg; has deferred any type of injectable in the past; recently had home healthcare visit and Hgba1c was checked at 7.3; would prefer not to do labs today unless necessary; no concerns for low blood sugar;      Objective:  Vitals:   05/01/22 1021  BP: 134/64  Pulse: (!) 56  Resp: 19  SpO2: 98%  Weight: 186 lb (84.4 kg)  Height:  (1.753 m)    General: Well developed, well nourished, in no acute distress  Skin : Warm and dry.  Head: Normocephalic and atraumatic  Lungs: Respirations unlabored; clear to auscultation bilaterally without wheeze, rales, rhonchi  CVS exam: normal rate and regular rhythm.  Neurologic: Alert and oriented; speech intact; face symmetrical; moves all extremities well; CNII-XII intact without focal deficit   Assessment:  1. Type 2 diabetes mellitus without long-term current use of insulin   2. Stage 3a chronic kidney disease   3. Prostate cancer   4. Coronary artery disease involving native coronary artery of native heart without angina pectoris     Plan:  Hgba1c in March 2024 was at 7.3; re-check in 6 months; Under care of nephrology- does have follow up scheduled; Under care of urology; Under care of cardiology;   Return in about 6 months (around 10/31/2022) for follow up/ please cancel July appointment.  No orders of the defined types were placed in this encounter.   Requested Prescriptions    No prescriptions requested or ordered in this encounter

## 2022-06-13 ENCOUNTER — Encounter: Payer: Self-pay | Admitting: Gastroenterology

## 2022-06-21 DIAGNOSIS — H401111 Primary open-angle glaucoma, right eye, mild stage: Secondary | ICD-10-CM | POA: Diagnosis not present

## 2022-06-27 DIAGNOSIS — E1169 Type 2 diabetes mellitus with other specified complication: Secondary | ICD-10-CM | POA: Diagnosis not present

## 2022-06-27 DIAGNOSIS — E785 Hyperlipidemia, unspecified: Secondary | ICD-10-CM | POA: Diagnosis not present

## 2022-06-27 DIAGNOSIS — N2581 Secondary hyperparathyroidism of renal origin: Secondary | ICD-10-CM | POA: Diagnosis not present

## 2022-06-27 DIAGNOSIS — D631 Anemia in chronic kidney disease: Secondary | ICD-10-CM | POA: Diagnosis not present

## 2022-06-27 DIAGNOSIS — N183 Chronic kidney disease, stage 3 unspecified: Secondary | ICD-10-CM | POA: Diagnosis not present

## 2022-06-27 DIAGNOSIS — I129 Hypertensive chronic kidney disease with stage 1 through stage 4 chronic kidney disease, or unspecified chronic kidney disease: Secondary | ICD-10-CM | POA: Diagnosis not present

## 2022-06-27 DIAGNOSIS — E1122 Type 2 diabetes mellitus with diabetic chronic kidney disease: Secondary | ICD-10-CM | POA: Diagnosis not present

## 2022-07-17 DIAGNOSIS — R350 Frequency of micturition: Secondary | ICD-10-CM | POA: Diagnosis not present

## 2022-07-17 DIAGNOSIS — R3912 Poor urinary stream: Secondary | ICD-10-CM | POA: Diagnosis not present

## 2022-07-31 ENCOUNTER — Ambulatory Visit: Payer: Medicare Other | Admitting: Family

## 2022-10-15 DIAGNOSIS — R42 Dizziness and giddiness: Secondary | ICD-10-CM | POA: Diagnosis not present

## 2022-10-15 DIAGNOSIS — R350 Frequency of micturition: Secondary | ICD-10-CM | POA: Diagnosis not present

## 2022-10-15 DIAGNOSIS — E119 Type 2 diabetes mellitus without complications: Secondary | ICD-10-CM | POA: Diagnosis not present

## 2022-10-15 DIAGNOSIS — Z20822 Contact with and (suspected) exposure to covid-19: Secondary | ICD-10-CM | POA: Diagnosis not present

## 2022-10-15 DIAGNOSIS — N3001 Acute cystitis with hematuria: Secondary | ICD-10-CM | POA: Diagnosis not present

## 2022-10-15 DIAGNOSIS — R6883 Chills (without fever): Secondary | ICD-10-CM | POA: Diagnosis not present

## 2022-10-17 ENCOUNTER — Encounter (HOSPITAL_COMMUNITY): Payer: Self-pay

## 2022-10-17 ENCOUNTER — Other Ambulatory Visit: Payer: Self-pay

## 2022-10-17 ENCOUNTER — Emergency Department (HOSPITAL_COMMUNITY)
Admission: EM | Admit: 2022-10-17 | Discharge: 2022-10-17 | Disposition: A | Discharge status: Home / Self Care | Payer: Medicare Other | Attending: Emergency Medicine | Admitting: Emergency Medicine

## 2022-10-17 ENCOUNTER — Emergency Department (HOSPITAL_COMMUNITY): Payer: Medicare Other

## 2022-10-17 DIAGNOSIS — N3000 Acute cystitis without hematuria: Secondary | ICD-10-CM | POA: Diagnosis not present

## 2022-10-17 DIAGNOSIS — R066 Hiccough: Secondary | ICD-10-CM | POA: Diagnosis not present

## 2022-10-17 DIAGNOSIS — Z79899 Other long term (current) drug therapy: Secondary | ICD-10-CM | POA: Diagnosis not present

## 2022-10-17 DIAGNOSIS — E1165 Type 2 diabetes mellitus with hyperglycemia: Secondary | ICD-10-CM | POA: Insufficient documentation

## 2022-10-17 DIAGNOSIS — Z7982 Long term (current) use of aspirin: Secondary | ICD-10-CM | POA: Diagnosis not present

## 2022-10-17 DIAGNOSIS — N179 Acute kidney failure, unspecified: Secondary | ICD-10-CM | POA: Diagnosis not present

## 2022-10-17 DIAGNOSIS — R059 Cough, unspecified: Secondary | ICD-10-CM | POA: Diagnosis not present

## 2022-10-17 DIAGNOSIS — I10 Essential (primary) hypertension: Secondary | ICD-10-CM | POA: Diagnosis not present

## 2022-10-17 DIAGNOSIS — Z951 Presence of aortocoronary bypass graft: Secondary | ICD-10-CM | POA: Diagnosis not present

## 2022-10-17 DIAGNOSIS — R531 Weakness: Secondary | ICD-10-CM | POA: Diagnosis present

## 2022-10-17 DIAGNOSIS — Z7984 Long term (current) use of oral hypoglycemic drugs: Secondary | ICD-10-CM | POA: Diagnosis not present

## 2022-10-17 DIAGNOSIS — R739 Hyperglycemia, unspecified: Secondary | ICD-10-CM

## 2022-10-17 DIAGNOSIS — R6883 Chills (without fever): Secondary | ICD-10-CM | POA: Diagnosis not present

## 2022-10-17 LAB — CBC
HCT: 42.9 % (ref 39.0–52.0)
Hemoglobin: 13.9 g/dL (ref 13.0–17.0)
MCH: 27.6 pg (ref 26.0–34.0)
MCHC: 32.4 g/dL (ref 30.0–36.0)
MCV: 85.1 fL (ref 80.0–100.0)
Platelets: 70 10*3/uL — ABNORMAL LOW (ref 150–400)
RBC: 5.04 MIL/uL (ref 4.22–5.81)
RDW: 14.1 % (ref 11.5–15.5)
WBC: 13.4 10*3/uL — ABNORMAL HIGH (ref 4.0–10.5)
nRBC: 0 % (ref 0.0–0.2)

## 2022-10-17 LAB — URINALYSIS, ROUTINE W REFLEX MICROSCOPIC
Bilirubin Urine: NEGATIVE
Glucose, UA: >=500 mg/dL — AB
Ketones, ur: 5 mg/dL — AB
Nitrite: NEGATIVE
Protein, ur: 30 mg/dL — AB
Specific Gravity, Urine: 1.018 (ref 1.005–1.030)
pH: 5 (ref 5.0–8.0)

## 2022-10-17 LAB — BASIC METABOLIC PANEL
Anion gap: 12 (ref 5–15)
BUN: 49 mg/dL — ABNORMAL HIGH (ref 8–23)
CO2: 23 mmol/L (ref 22–32)
Calcium: 8.8 mg/dL — ABNORMAL LOW (ref 8.9–10.3)
Chloride: 97 mmol/L — ABNORMAL LOW (ref 98–111)
Creatinine, Ser: 2.35 mg/dL — ABNORMAL HIGH (ref 0.61–1.24)
GFR, Estimated: 29 mL/min — ABNORMAL LOW (ref >60)
Glucose, Bld: 433 mg/dL — ABNORMAL HIGH (ref 70–99)
Potassium: 4.3 mmol/L (ref 3.5–5.1)
Sodium: 132 mmol/L — ABNORMAL LOW (ref 135–145)

## 2022-10-17 LAB — CBG MONITORING, ED
Glucose-Capillary: 414 mg/dL — ABNORMAL HIGH (ref 70–99)
Glucose-Capillary: 313 mg/dL — ABNORMAL HIGH (ref 70–99)

## 2022-10-17 MED ORDER — SODIUM CHLORIDE 0.9 % IV BOLUS
1000.0000 mL | Freq: Once | INTRAVENOUS | Status: AC
  Administered 2022-10-17: 1000 mL via INTRAVENOUS

## 2022-10-17 MED ORDER — SODIUM CHLORIDE 0.9 % IV BOLUS: 500.0000 mL | Freq: Once | INTRAVENOUS | Status: DC

## 2022-10-17 MED ORDER — SODIUM CHLORIDE 0.9 % IV SOLN
1.0000 g | Freq: Once | INTRAVENOUS | Status: AC
  Administered 2022-10-17: 1 g via INTRAVENOUS
  Filled 2022-10-17: qty 10

## 2022-10-17 MED ORDER — CEPHALEXIN 500 MG PO CAPS: 500.0000 mg | ORAL_CAPSULE | Freq: Two times a day (BID) | ORAL | 0 refills | Status: DC

## 2022-10-17 MED ORDER — INSULIN ASPART 100 UNIT/ML IJ SOLN
10.0000 [IU] | Freq: Once | INTRAMUSCULAR | Status: AC
  Administered 2022-10-17: 10 [IU] via SUBCUTANEOUS
  Filled 2022-10-17: qty 0.1

## 2022-10-17 MED ORDER — CHLORPROMAZINE HCL 25 MG PO TABS
25.0000 mg | ORAL_TABLET | Freq: Once | ORAL | Status: AC
  Administered 2022-10-17: 25 mg via ORAL
  Filled 2022-10-17: qty 1

## 2022-10-17 MED ORDER — CHLORPROMAZINE HCL 25 MG PO TABS: 25.0000 mg | ORAL_TABLET | Freq: Three times a day (TID) | ORAL | 0 refills | Status: DC | PRN

## 2022-10-17 NOTE — ED Provider Notes (Signed)
Angel Benson EMERGENCY DEPARTMENT AT [facility] Provider Note   CSN: [id number] Arrival date & time: 10/17/22  1307     History  Chief Complaint  Patient presents with   Hiccups   Dizziness    [name] is a 72 y.o. male hx of DM, here with multiple complaints. Patient states that he has not been feeling well for the last 5 days.  Patient states that on Saturday, he woke up and he had some chills.  He states that he feels achy and very dizzy.  He also has occasional hiccups.  He went to urgent care on Monday and had labs that showed hyperglycemia.  Patient also had a UTI and was discharged with Cipro and Amaryl.  Patient states that his dizziness has resolved.  However he has persistent hiccups.  He also has persistent chills.  He also noted that his blood sugar is elevated 300-4 100s.  Denies any abdominal pain or vomiting.  The history is provided by the patient.       Home Medications Prior to Admission medications   Medication Sig Start Date End Date Taking? Authorizing Provider  aspirin 81 MG tablet Take 81 mg by mouth daily.    [provider]  cholecalciferol (VITAMIN D3) 25 MCG (1000 UNIT) tablet Take 1,000 Units by mouth daily.    [provider]  glimepiride (AMARYL) 1 MG tablet Take 1 tablet (1 mg total) by mouth daily with breakfast. 04/27/22   Angel Bass, FNP  losartan (COZAAR) 25 MG tablet Take 25 mg by mouth daily. 03/09/22   [provider]  metoprolol succinate (TOPROL-XL) 50 MG 24 hr tablet Take 1 tablet (50 mg total) by mouth daily. Take with or immediately following a meal. 01/30/22   Angel Bass, FNP  Multiple Vitamin (MULTIVITAMIN) tablet Take 1 tablet by mouth daily.    [provider]  Netarsudil-Latanoprost (ROCKLATAN) 0.02-0.005 % SOLN Place 1 drop into both eyes at bedtime.    [provider]  ofloxacin (OCUFLOX) 0.3 % ophthalmic solution 1 drop in the morning, at noon, in the  evening, and at bedtime. Ofloxacin and Prednisolone per Dr. Dione Benson    [provider]  Omega-3 Fatty Acids (FISH OIL) 1000 MG CAPS Take 1 capsule by mouth at bedtime.    [provider]  rosuvastatin (CRESTOR) 40 MG tablet Take 1 tablet (40 mg total) by mouth daily. 01/30/22   Angel Bass, FNP  SIMBRINZA 1-0.2 % SUSP Place 1 drop into both eyes in the morning and at bedtime. 05/22/19   [provider]  tamsulosin (FLOMAX) 0.4 MG CAPS capsule TAKE 1 CAPSULE(0.4 MG) BY MOUTH DAILY AFTER SUPPER 01/30/22   Angel Bass, FNP  timolol (TIMOPTIC) 0.5 % ophthalmic solution Place 1 drop into both eyes every morning. 04/28/19   [provider]  vitamin B-12 (CYANOCOBALAMIN) 1000 MCG tablet Take 1,000 mcg by mouth daily.    [provider]      Allergies    Jardiance [empagliflozin]    Review of Systems   Review of Systems  Neurological:  Positive for dizziness.  All other systems reviewed and are negative.   Physical Exam Updated Vital Signs BP 135/70 (BP Location: Left Arm)   Pulse 93   Temp 98.8 F (37.1 C) (Oral)   Resp 16   Ht 5\' 9"  (1.753 m)   Wt 83.9 kg   SpO2 100%   BMI 27.32 kg/m  Physical Exam Vitals and  nursing note reviewed.  HENT:     Head: Normocephalic.     Nose: Nose normal.     Mouth/Throat:     Mouth: Mucous membranes are dry.  Eyes:     Extraocular Movements: Extraocular movements intact.     Pupils: Pupils are equal, round, and reactive to light.  Cardiovascular:     Rate and Rhythm: Normal rate and regular rhythm.     Pulses: Normal pulses.     Heart sounds: Normal heart sounds.  Pulmonary:     Effort: Pulmonary effort is normal.     Breath sounds: Normal breath sounds.  Abdominal:     General: Abdomen is flat.     Palpations: Abdomen is soft.     Comments: No abdominal tenderness  Musculoskeletal:        General: Normal range of motion.     Cervical back: Normal range of motion and neck supple.   Skin:    General: Skin is warm.     Capillary Refill: Capillary refill takes less than 2 seconds.  Neurological:     General: No focal deficit present.     Mental Status: He is alert and oriented to person, place, and time.     Comments: Cranial nerves II to XII intact.  Normal strength and sensation bilaterally.  Normal finger-to-nose bilaterally.  Patient has normal gait  Psychiatric:        Mood and Affect: Mood normal.        Behavior: Behavior normal.     ED Results / Procedures / Treatments   Labs (all labs ordered are listed, but only abnormal results are displayed) Labs Reviewed  BASIC METABOLIC PANEL - Abnormal; Notable for the following components:      Result Value   Sodium 132 (*)    Chloride 97 (*)    Glucose, Bld 433 (*)    BUN 49 (*)    Creatinine, Ser 2.35 (*)    Calcium 8.8 (*)    GFR, Estimated 29 (*)    All other components within normal limits  CBC - Abnormal; Notable for the following components:   WBC 13.4 (*)    Platelets 70 (*)    All other components within normal limits  URINALYSIS, ROUTINE W REFLEX MICROSCOPIC - Abnormal; Notable for the following components:   Glucose, UA >=500 (*)    Hgb urine dipstick MODERATE (*)    Ketones, ur 5 (*)    Protein, ur 30 (*)    Leukocytes,Ua MODERATE (*)    Bacteria, UA MANY (*)    All other components within normal limits  CBG MONITORING, ED - Abnormal; Notable for the following components:   Glucose-Capillary 414 (*)    All other components within normal limits    EKG EKG Interpretation Date/Time:  Wednesday October 17 2022 13:22:39 EDT Ventricular Rate:  97 PR Interval:  75 QRS Duration:  113 QT Interval:  368 QTC Calculation: 468 R Axis:   93  Text Interpretation: Sinus rhythm Short PR interval Inferolateral infarct, age indeterminate No significant change since last tracing Confirmed by Angel Benson [phone]) on 10/17/2022 3:13:09 PM  Radiology No results found.  Procedures Procedures     Medications Ordered in ED Medications  chlorproMAZINE (THORAZINE) tablet 25 mg (has no administration in time range)  insulin aspart (novoLOG) injection 10 Units (has no administration in time range)  cefTRIAXone (ROCEPHIN) 1 g in sodium chloride 0.9 % 100 mL IVPB (has no administration in time range)  sodium chloride 0.9 % bolus 1,000 mL (has no administration in time range)    ED Course/ Medical Decision Making/ A&P                                 Medical Decision Making Angel Benson is a 72 y.o. male here presenting with dizziness and hiccups and chills.  Consider persistent UTI versus pneumonia versus electrolyte abnormalities.  Patient has unremarkable neuroexam I do not think he has a stroke currently.  Plan to get CBC and BMP and UA and chest x-ray.  Will hydrate and reassess.  6:31 PM I reviewed patient's labs and patient has mild AKI with creatinine of 2.3.  Previous creatinine was about 1.8.  Patient's potassium is normal.  Patient's glucose was 400.  After IV fluids and insulin went down to 300.  Patient also still has a UTI.  Patient now tells me that he actually did not take his Cipro and did not take Amaryl that was prescribed by urgent care.  I told him that due to his kidney function, I will switch him to Keflex twice daily.  I will also give him some Thorazine as needed for hiccups  Problems Addressed: Acute cystitis without hematuria: acute illness or injury Hiccups: acute illness or injury Hyperglycemia: acute illness or injury  Amount and/or Complexity of Data Reviewed Labs: ordered. Decision-making details documented in ED Course. Radiology: ordered and independent interpretation performed. Decision-making details documented in ED Course.  Risk Prescription drug management.    Final Clinical Impression(s) / ED Diagnoses Final diagnoses:  None    Rx / DC Orders ED Discharge Orders     None         Charlynne Pander, MD 10/17/22 [phone]

## 2022-10-17 NOTE — Discharge Instructions (Addendum)
You have urinary back infection and I have ordered Keflex 500 mg twice daily for a week  You also need to pick up your Amaryl prescription and take it as prescribed.  Take Thorazine as needed for hiccups  You need to see your doctor in a week and have your kidney function and your glucose rechecked  Return to ER if you have more hiccups, dizziness, blood sugar greater than 600 or fever

## 2022-10-17 NOTE — ED Triage Notes (Signed)
Patient reports hiccups that "feel like they don't go all the way down." Since Sunday. Patient also reports intermittent dizziness since Monday. Denies chest pain and SOB. Seen at PCP earlier in the week for same.

## 2022-10-17 NOTE — ED Notes (Signed)
Pt CBG was due, informed the nurse and was told to wait 30 min for getting the CBG

## 2022-11-02 ENCOUNTER — Ambulatory Visit (INDEPENDENT_AMBULATORY_CARE_PROVIDER_SITE_OTHER): Payer: Medicare Other | Admitting: Family

## 2022-11-02 ENCOUNTER — Encounter: Payer: Self-pay | Admitting: Family

## 2022-11-02 VITALS — BP 114/62 | HR 73 | Resp 18 | Ht 69.0 in | Wt 179.4 lb

## 2022-11-02 DIAGNOSIS — E1159 Type 2 diabetes mellitus with other circulatory complications: Secondary | ICD-10-CM | POA: Diagnosis not present

## 2022-11-02 DIAGNOSIS — C61 Malignant neoplasm of prostate: Secondary | ICD-10-CM | POA: Diagnosis not present

## 2022-11-02 DIAGNOSIS — Z87440 Personal history of urinary (tract) infections: Secondary | ICD-10-CM

## 2022-11-02 DIAGNOSIS — E1169 Type 2 diabetes mellitus with other specified complication: Secondary | ICD-10-CM | POA: Diagnosis not present

## 2022-11-02 DIAGNOSIS — I152 Hypertension secondary to endocrine disorders: Secondary | ICD-10-CM | POA: Diagnosis not present

## 2022-11-02 DIAGNOSIS — E119 Type 2 diabetes mellitus without complications: Secondary | ICD-10-CM

## 2022-11-02 DIAGNOSIS — E785 Hyperlipidemia, unspecified: Secondary | ICD-10-CM

## 2022-11-02 DIAGNOSIS — Z7984 Long term (current) use of oral hypoglycemic drugs: Secondary | ICD-10-CM

## 2022-11-02 LAB — CBC WITH DIFFERENTIAL/PLATELET
Basophils Absolute: 0.1 10*3/uL (ref 0.0–0.1)
Basophils Relative: 0.9 % (ref 0.0–3.0)
Eosinophils Absolute: 0.1 10*3/uL (ref 0.0–0.7)
Eosinophils Relative: 1.5 % (ref 0.0–5.0)
HCT: 43.1 % (ref 39.0–52.0)
Hemoglobin: 13.4 g/dL (ref 13.0–17.0)
Lymphocytes Relative: 20 % (ref 12.0–46.0)
Lymphs Abs: 1.4 10*3/uL (ref 0.7–4.0)
MCHC: 31 g/dL (ref 30.0–36.0)
MCV: 87.1 fL (ref 78.0–100.0)
Monocytes Absolute: 0.6 10*3/uL (ref 0.1–1.0)
Monocytes Relative: 8.1 % (ref 3.0–12.0)
Neutro Abs: 5 10*3/uL (ref 1.4–7.7)
Neutrophils Relative %: 69.5 % (ref 43.0–77.0)
Platelets: 213 10*3/uL (ref 150.0–400.0)
RBC: 4.95 Mil/uL (ref 4.22–5.81)
RDW: 15.3 % (ref 11.5–15.5)
WBC: 7.2 10*3/uL (ref 4.0–10.5)

## 2022-11-02 LAB — COMPREHENSIVE METABOLIC PANEL
ALT: 25 U/L (ref 0–53)
AST: 19 U/L (ref 0–37)
Albumin: 3.8 g/dL (ref 3.5–5.2)
Alkaline Phosphatase: 75 U/L (ref 39–117)
BUN: 18 mg/dL (ref 6–23)
CO2: 28 meq/L (ref 19–32)
Calcium: 9.5 mg/dL (ref 8.4–10.5)
Chloride: 105 meq/L (ref 96–112)
Creatinine, Ser: 1.61 mg/dL — ABNORMAL HIGH (ref 0.40–1.50)
GFR: 42.98 mL/min — ABNORMAL LOW (ref >60.00)
Glucose, Bld: 172 mg/dL — ABNORMAL HIGH (ref 70–99)
Potassium: 4.8 meq/L (ref 3.5–5.1)
Sodium: 141 meq/L (ref 135–145)
Total Bilirubin: 0.7 mg/dL (ref 0.2–1.2)
Total Protein: 7 g/dL (ref 6.0–8.3)

## 2022-11-02 LAB — MICROALBUMIN / CREATININE URINE RATIO
Creatinine,U: 192.2 mg/dL
Microalb Creat Ratio: 1 mg/g (ref 0.0–30.0)
Microalb, Ur: 2 mg/dL — ABNORMAL HIGH (ref 0.0–1.9)

## 2022-11-02 LAB — HEMOGLOBIN A1C: Hgb A1c MFr Bld: 10.5 % — ABNORMAL HIGH (ref 4.6–6.5)

## 2022-11-02 LAB — PSA: PSA: 11.27 ng/mL — ABNORMAL HIGH (ref 0.10–4.00)

## 2022-11-02 MED ORDER — LOSARTAN POTASSIUM 25 MG PO TABS: 25.0000 mg | ORAL_TABLET | Freq: Every day | ORAL | 3 refills | Status: DC

## 2022-11-02 MED ORDER — TAMSULOSIN HCL 0.4 MG PO CAPS: ORAL_CAPSULE | ORAL | 3 refills | Status: DC

## 2022-11-02 MED ORDER — ROSUVASTATIN CALCIUM 40 MG PO TABS: 40.0000 mg | ORAL_TABLET | Freq: Every day | ORAL | 3 refills | Status: DC

## 2022-11-02 MED ORDER — METOPROLOL SUCCINATE ER 50 MG PO TB24: 50.0000 mg | ORAL_TABLET | Freq: Every day | ORAL | 3 refills | Status: DC

## 2022-11-02 NOTE — Progress Notes (Signed)
Angel Benson is a 72 y.o. male with the following history as recorded in EpicCare:  Patient Active Problem List   Diagnosis Date Noted   Prostate cancer (HCC) 01/30/2022   Hematochezia    Decreased hemoglobin    Seizure-like activity (HCC) 11/24/2020   Hypertension associated with diabetes (HCC) 11/24/2020   Elevated CK 11/24/2020   CKD (chronic kidney disease), stage III (HCC) 11/24/2020   Left nephrolithiasis 11/24/2020   Leukocytosis 11/24/2020   B12 deficiency 07/14/2020   Anemia 07/14/2020   Thrombocytopenia (HCC) 07/14/2020   Cardiomyopathy, ischemic 09/27/2014   Routine general medical examination at a health care facility 02/01/2014   S/P CABG x 5 08/26/2013   Hyperlipidemia associated with type 2 diabetes mellitus (HCC) 08/25/2013   Coronary artery disease involving native coronary artery of native heart without angina pectoris 08/25/2013   Benign essential HTN 08/25/2013   Type 2 diabetes mellitus with chronic kidney disease, without long-term current use of insulin (HCC) 08/25/2013   NSTEMI (non-ST elevated myocardial infarction) (HCC) 08/24/2013    Current Outpatient Medications  Medication Sig Dispense Refill   aspirin 81 MG tablet Take 81 mg by mouth daily.     cholecalciferol (VITAMIN D3) 25 MCG (1000 UNIT) tablet Take 1,000 Units by mouth daily.     glimepiride (AMARYL) 1 MG tablet Take 1 tablet (1 mg total) by mouth daily with breakfast. 90 tablet 0   Multiple Vitamin (MULTIVITAMIN) tablet Take 1 tablet by mouth daily.     Netarsudil-Latanoprost (ROCKLATAN) 0.02-0.005 % SOLN Place 1 drop into both eyes at bedtime.     Omega-3 Fatty Acids (FISH OIL) 1000 MG CAPS Take 1 capsule by mouth at bedtime.     SIMBRINZA 1-0.2 % SUSP Place 1 drop into both eyes in the morning and at bedtime.     timolol (TIMOPTIC) 0.5 % ophthalmic solution Place 1 drop into both eyes every morning.     vitamin B-12 (CYANOCOBALAMIN) 1000 MCG tablet Take 1,000 mcg by mouth daily.      losartan (COZAAR) 25 MG tablet Take 1 tablet (25 mg total) by mouth daily. 90 tablet 3   metoprolol succinate (TOPROL-XL) 50 MG 24 hr tablet Take 1 tablet (50 mg total) by mouth daily. Take with or immediately following a meal. 90 tablet 3   rosuvastatin (CRESTOR) 40 MG tablet Take 1 tablet (40 mg total) by mouth daily. 90 tablet 3   tamsulosin (FLOMAX) 0.4 MG CAPS capsule TAKE 1 CAPSULE(0.4 MG) BY MOUTH DAILY AFTER SUPPER 90 capsule 3   No current facility-administered medications for this visit.    Allergies: Jardiance [empagliflozin]  Past Medical History:  Diagnosis Date   Anemia    CAD, multiple vessel    s/p CABG   Cataract    bilateral cataract removed   Chicken pox    Diabetes mellitus without complication (HCC)    Type 2   GERD (gastroesophageal reflux disease)    Glaucoma    Hypercholesteremia    Hypertension    Myocardial infarction (HCC) 2015   then CABG   Prostate CA (HCC) 2022   "watching it" - no sx planned at this time (09/05/2020)   Tuberculosis 1993   received tx post exposure to person with TB   Urinary tract bacterial infections    Vitamin B12 deficiency     Past Surgical History:  Procedure Laterality Date   CARDIAC CATHETERIZATION     CATARACT EXTRACTION, BILATERAL     CORONARY ARTERY BYPASS GRAFT N/A 08/26/2013  Procedure: CORONARY ARTERY BYPASS GRAFTING (CABG) times five, using left internal mammary artery, right and left greater saphenous vein;  Surgeon: Delight Ovens, MD;  Location: [facility] OR;  Service: Open Heart Surgery;  Laterality: N/A;  LIMA-LAD; SEQ SVG-OM1-OM2-OM3; SVG-RCA    INTRAOPERATIVE TRANSESOPHAGEAL ECHOCARDIOGRAM N/A 08/26/2013   Procedure: INTRAOPERATIVE TRANSESOPHAGEAL ECHOCARDIOGRAM;  Surgeon: Delight Ovens, MD;  Location: [facility] OR;  Service: Open Heart Surgery;  Laterality: N/A;    Family History  Problem Relation Age of Onset   Diabetes Mother    Alzheimer's disease Mother    Heart disease Father    Diabetes Father     Heart disease Brother    Colon polyps Neg Hx    Colon cancer Neg Hx    Esophageal cancer Neg Hx    Rectal cancer Neg Hx    Stomach cancer Neg Hx     Social History   Tobacco Use   Smoking status: Never    Passive exposure: Past   Smokeless tobacco: Never  Substance Use Topics   Alcohol use: No    Subjective:   Presents for 6 month follow up on chronic care needs; Was seen at [facility] and ER 2 weeks ago with UTI/ AKI; does have history of prostate cancer- last saw urology in July 2024;  Has been taking Glimperide as prescribed- admits that blood sugars have been elevated for the past few months since taking prednisone from his ophthalmologist;   Objective:  Vitals:   11/02/22 0945  BP: 114/62  Pulse: 73  Resp: 18  SpO2: 98%  Weight: 179 lb 6.4 oz (81.4 kg)  Height: 5\' 9"  (1.753 m)    General: Well developed, well nourished, in no acute distress  Skin : Warm and dry.  Head: Normocephalic and atraumatic  Eyes: Sclera and conjunctiva clear; pupils round and reactive to light; extraocular movements intact  Ears: External normal; canals clear; tympanic membranes normal  Oropharynx: Pink, supple. No suspicious lesions  Neck: Supple without thyromegaly, adenopathy  Lungs: Respirations unlabored; clear to auscultation bilaterally without wheeze, rales, rhonchi  CVS exam: normal rate and regular rhythm.  Neurologic: Alert and oriented; speech intact; face symmetrical; moves all extremities well; CNII-XII intact without focal deficit   Assessment:  1. Type 2 diabetes mellitus without complication, without long-term current use of insulin (HCC)   2. Long term current use of oral hypoglycemic drug   3. History of UTI   4. Prostate cancer (HCC)   5. Hypertension associated with diabetes (HCC)   6. Hyperlipidemia associated with type 2 diabetes mellitus (HCC)     Plan:  Will update labs today; will most likely need to increase dosage of Glimepiride; patient cannot take Metformin due  to CKD and could not tolerate Jardiance; has deferred injectable medication- to consider Rybelsus; 3.   Check urine culture today; 4.   Encouraged patient to call his urologist to schedule follow up- due in January 2025;   Plan for follow up here in 3 months; he is also encouraged to see his cardiologist for yearly follow up; he plans to get flu, COVID and RSV at pharmacy;   No follow-ups on file.  Orders Placed This Encounter  Procedures   Urine Culture   CBC with Differential/Platelet   Comp Met (CMET)   Hemoglobin A1c   Urine Microalbumin w/creat. ratio   PSA    Requested Prescriptions   Signed Prescriptions Disp Refills   losartan (COZAAR) 25 MG tablet 90 tablet 3  Sig: Take 1 tablet (25 mg total) by mouth daily.   tamsulosin (FLOMAX) 0.4 MG CAPS capsule 90 capsule 3    Sig: TAKE 1 CAPSULE(0.4 MG) BY MOUTH DAILY AFTER SUPPER   metoprolol succinate (TOPROL-XL) 50 MG 24 hr tablet 90 tablet 3    Sig: Take 1 tablet (50 mg total) by mouth daily. Take with or immediately following a meal.   rosuvastatin (CRESTOR) 40 MG tablet 90 tablet 3    Sig: Take 1 tablet (40 mg total) by mouth daily.

## 2022-11-03 LAB — URINE CULTURE
MICRO NUMBER:: 15613570
Result:: NO GROWTH
SPECIMEN QUALITY:: ADEQUATE

## 2022-11-06 ENCOUNTER — Other Ambulatory Visit: Payer: Self-pay | Admitting: Family

## 2022-11-06 MED ORDER — RYBELSUS 3 MG PO TABS: 3.0000 mg | ORAL_TABLET | Freq: Every day | ORAL | 0 refills | Status: DC

## 2022-11-08 ENCOUNTER — Other Ambulatory Visit (HOSPITAL_COMMUNITY): Payer: Self-pay

## 2022-11-08 ENCOUNTER — Telehealth: Payer: Self-pay

## 2022-11-08 ENCOUNTER — Encounter: Payer: Self-pay | Admitting: Physician Assistant

## 2022-11-08 NOTE — Telephone Encounter (Signed)
Pharmacy Patient Advocate Encounter  Insurance verification completed.    The patient is insured through [facility] ADVANTAGE/RX ADVANCE   Ran test claim for RYBELSUS. Currently a quantity of 30 is a 30 day supply and the co-pay is 0.00 .   This test claim was processed through [facility]- copay amounts may vary at other pharmacies due to pharmacy/plan contracts, or as the patient moves through the different stages of their insurance plan.

## 2022-12-10 NOTE — Telephone Encounter (Signed)
PA initiated via Covermymeds; KEY: [id number].  PA approved.   CaseId:[id number];Status:Approved;Review Type:Prior Auth;Coverage Start Date:11/10/2022;Coverage End Date:01/14/2098

## 2023-01-21 DIAGNOSIS — H43812 Vitreous degeneration, left eye: Secondary | ICD-10-CM | POA: Diagnosis not present

## 2023-01-21 DIAGNOSIS — H0102B Squamous blepharitis left eye, upper and lower eyelids: Secondary | ICD-10-CM | POA: Diagnosis not present

## 2023-01-21 DIAGNOSIS — H401131 Primary open-angle glaucoma, bilateral, mild stage: Secondary | ICD-10-CM | POA: Diagnosis not present

## 2023-01-21 DIAGNOSIS — H0102A Squamous blepharitis right eye, upper and lower eyelids: Secondary | ICD-10-CM | POA: Diagnosis not present

## 2023-01-21 DIAGNOSIS — E119 Type 2 diabetes mellitus without complications: Secondary | ICD-10-CM | POA: Diagnosis not present

## 2023-01-21 DIAGNOSIS — H04123 Dry eye syndrome of bilateral lacrimal glands: Secondary | ICD-10-CM | POA: Diagnosis not present

## 2023-01-21 DIAGNOSIS — Z961 Presence of intraocular lens: Secondary | ICD-10-CM | POA: Diagnosis not present

## 2023-01-29 ENCOUNTER — Telehealth: Payer: Self-pay

## 2023-01-29 NOTE — Progress Notes (Signed)
 Care Guide Pharmacy Note  01/29/2023 Name: Angel Benson MRN: [id number] DOB: Feb 08, 1950  Referred By: Jason Leita Repine, FNP Reason for referral: Care Coordination ([facility] Diabetes. )   Angel Benson is a 73 y.o. year old male who is a primary care patient of Jason Leita Repine, FNP.  Angel Benson was referred to the pharmacist for assistance related to: DMII  Successful contact was made with the patient to discuss pharmacy services.  Patient declines engagement at this time. Contact information was provided to the patient should they wish to reach out for assistance at a later time.  [name] Assistant [phone]

## 2023-02-01 ENCOUNTER — Encounter: Payer: Self-pay | Admitting: Family

## 2023-02-01 ENCOUNTER — Ambulatory Visit (INDEPENDENT_AMBULATORY_CARE_PROVIDER_SITE_OTHER): Payer: Medicare Other | Admitting: Family

## 2023-02-01 ENCOUNTER — Other Ambulatory Visit: Payer: Self-pay | Admitting: Family

## 2023-02-01 ENCOUNTER — Telehealth: Payer: Self-pay | Admitting: Family

## 2023-02-01 VITALS — BP 122/64 | HR 67 | Ht 69.0 in | Wt 182.4 lb

## 2023-02-01 DIAGNOSIS — N1832 Chronic kidney disease, stage 3b: Secondary | ICD-10-CM

## 2023-02-01 DIAGNOSIS — E1122 Type 2 diabetes mellitus with diabetic chronic kidney disease: Secondary | ICD-10-CM | POA: Diagnosis not present

## 2023-02-01 DIAGNOSIS — Z7984 Long term (current) use of oral hypoglycemic drugs: Secondary | ICD-10-CM

## 2023-02-01 LAB — CBC WITH DIFFERENTIAL/PLATELET
Basophils Absolute: 0 10*3/uL (ref 0.0–0.1)
Basophils Relative: 0.5 % (ref 0.0–3.0)
Eosinophils Absolute: 0.2 10*3/uL (ref 0.0–0.7)
Eosinophils Relative: 2.3 % (ref 0.0–5.0)
HCT: 43.6 % (ref 39.0–52.0)
Hemoglobin: 13.9 g/dL (ref 13.0–17.0)
Lymphocytes Relative: 21.7 % (ref 12.0–46.0)
Lymphs Abs: 1.9 10*3/uL (ref 0.7–4.0)
MCHC: 31.9 g/dL (ref 30.0–36.0)
MCV: 86 fL (ref 78.0–100.0)
Monocytes Absolute: 0.6 10*3/uL (ref 0.1–1.0)
Monocytes Relative: 7.2 % (ref 3.0–12.0)
Neutro Abs: 5.9 10*3/uL (ref 1.4–7.7)
Neutrophils Relative %: 68.3 % (ref 43.0–77.0)
Platelets: 146 10*3/uL — ABNORMAL LOW (ref 150.0–400.0)
RBC: 5.07 Mil/uL (ref 4.22–5.81)
RDW: 13.9 % (ref 11.5–15.5)
WBC: 8.7 10*3/uL (ref 4.0–10.5)

## 2023-02-01 LAB — COMPREHENSIVE METABOLIC PANEL
ALT: 20 U/L (ref 0–53)
AST: 17 U/L (ref 0–37)
Albumin: 4.3 g/dL (ref 3.5–5.2)
Alkaline Phosphatase: 74 U/L (ref 39–117)
BUN: 29 mg/dL — ABNORMAL HIGH (ref 6–23)
CO2: 28 meq/L (ref 19–32)
Calcium: 9.2 mg/dL (ref 8.4–10.5)
Chloride: 106 meq/L (ref 96–112)
Creatinine, Ser: 2.2 mg/dL — ABNORMAL HIGH (ref 0.40–1.50)
GFR: 29.5 mL/min — ABNORMAL LOW (ref >60.00)
Glucose, Bld: 187 mg/dL — ABNORMAL HIGH (ref 70–99)
Potassium: 4.6 meq/L (ref 3.5–5.1)
Sodium: 142 meq/L (ref 135–145)
Total Bilirubin: 0.6 mg/dL (ref 0.2–1.2)
Total Protein: 7.8 g/dL (ref 6.0–8.3)

## 2023-02-01 LAB — HEMOGLOBIN A1C: Hgb A1c MFr Bld: 10.5 % — ABNORMAL HIGH (ref 4.6–6.5)

## 2023-02-01 MED ORDER — RYBELSUS 3 MG PO TABS: 3.0000 mg | ORAL_TABLET | Freq: Every day | ORAL | 0 refills | Status: DC

## 2023-02-01 MED ORDER — GLIMEPIRIDE 1 MG PO TABS: 1.0000 mg | ORAL_TABLET | Freq: Every day | ORAL | 0 refills | Status: DC

## 2023-02-01 NOTE — Telephone Encounter (Signed)
Please call him on Monday, 02/04/23 to get scheduled for 2 week follow up; please see if he was able to start Rybelsus or check if we need to do PA;

## 2023-02-01 NOTE — Progress Notes (Signed)
Angel Benson is a 73 y.o. male with the following history as recorded in EpicCare:  Patient Active Problem List   Diagnosis Date Noted   Prostate cancer (HCC) 01/30/2022   Hematochezia    Decreased hemoglobin    Seizure-like activity (HCC) 11/24/2020   Hypertension associated with diabetes (HCC) 11/24/2020   Elevated CK 11/24/2020   CKD (chronic kidney disease), stage III (HCC) 11/24/2020   Left nephrolithiasis 11/24/2020   Leukocytosis 11/24/2020   B12 deficiency 07/14/2020   Anemia 07/14/2020   Thrombocytopenia (HCC) 07/14/2020   Cardiomyopathy, ischemic 09/27/2014   Routine general medical examination at a health care facility 02/01/2014   S/P CABG x 5 08/26/2013   Hyperlipidemia associated with type 2 diabetes mellitus (HCC) 08/25/2013   Coronary artery disease involving native coronary artery of native heart without angina pectoris 08/25/2013   Benign essential HTN 08/25/2013   Type 2 diabetes mellitus with chronic kidney disease, without long-term current use of insulin (HCC) 08/25/2013   NSTEMI (non-ST elevated myocardial infarction) (HCC) 08/24/2013    Current Outpatient Medications  Medication Sig Dispense Refill   aspirin 81 MG tablet Take 81 mg by mouth daily.     atropine 1 % ophthalmic solution Place 1 drop into the right eye.     cholecalciferol (VITAMIN D3) 25 MCG (1000 UNIT) tablet Take 1,000 Units by mouth daily.     losartan (COZAAR) 25 MG tablet Take 1 tablet (25 mg total) by mouth daily. 90 tablet 3   metoprolol succinate (TOPROL-XL) 50 MG 24 hr tablet Take 1 tablet (50 mg total) by mouth daily. Take with or immediately following a meal. 90 tablet 3   Multiple Vitamin (MULTIVITAMIN) tablet Take 1 tablet by mouth daily.     Netarsudil-Latanoprost (ROCKLATAN) 0.02-0.005 % SOLN Place 1 drop into both eyes at bedtime.     Omega-3 Fatty Acids (FISH OIL) 1000 MG CAPS Take 1 capsule by mouth at bedtime.     rosuvastatin (CRESTOR) 40 MG tablet Take 1 tablet (40 mg  total) by mouth daily. 90 tablet 3   SIMBRINZA 1-0.2 % SUSP Place 1 drop into both eyes in the morning and at bedtime.     tamsulosin (FLOMAX) 0.4 MG CAPS capsule TAKE 1 CAPSULE(0.4 MG) BY MOUTH DAILY AFTER SUPPER 90 capsule 3   timolol (TIMOPTIC) 0.5 % ophthalmic solution Place 1 drop into both eyes every morning.     vitamin B-12 (CYANOCOBALAMIN) 1000 MCG tablet Take 1,000 mcg by mouth daily.     glimepiride (AMARYL) 1 MG tablet Take 1 tablet (1 mg total) by mouth daily with breakfast. (Patient not taking: Reported on 02/01/2023) 90 tablet 0   Semaglutide (RYBELSUS) 3 MG TABS Take 1 tablet (3 mg total) by mouth daily. (Patient not taking: Reported on 02/01/2023) 30 tablet 0   No current facility-administered medications for this visit.    Allergies: Jardiance [empagliflozin]  Past Medical History:  Diagnosis Date   Anemia    CAD, multiple vessel    s/p CABG   Cataract    bilateral cataract removed   Chicken pox    Diabetes mellitus without complication (HCC)    Type 2   GERD (gastroesophageal reflux disease)    Glaucoma    Hypercholesteremia    Hypertension    Myocardial infarction (HCC) 2015   then CABG   Prostate CA (HCC) 2022   "watching it" - no sx planned at this time (09/05/2020)   Tuberculosis 1993   received tx post exposure to person  with TB   Urinary tract bacterial infections    Vitamin B12 deficiency     Past Surgical History:  Procedure Laterality Date   CARDIAC CATHETERIZATION     CATARACT EXTRACTION, BILATERAL     CORONARY ARTERY BYPASS GRAFT N/A 08/26/2013   Procedure: CORONARY ARTERY BYPASS GRAFTING (CABG) times five, using left internal mammary artery, right and left greater saphenous vein;  Surgeon: Delight Ovens, MD;  Location: [facility] OR;  Service: Open Heart Surgery;  Laterality: N/A;  LIMA-LAD; SEQ SVG-OM1-OM2-OM3; SVG-RCA    INTRAOPERATIVE TRANSESOPHAGEAL ECHOCARDIOGRAM N/A 08/26/2013   Procedure: INTRAOPERATIVE TRANSESOPHAGEAL ECHOCARDIOGRAM;   Surgeon: Delight Ovens, MD;  Location: [facility] OR;  Service: Open Heart Surgery;  Laterality: N/A;    Family History  Problem Relation Age of Onset   Diabetes Mother    Alzheimer's disease Mother    Heart disease Father    Diabetes Father    Heart disease Brother    Colon polyps Neg Hx    Colon cancer Neg Hx    Esophageal cancer Neg Hx    Rectal cancer Neg Hx    Stomach cancer Neg Hx     Social History   Tobacco Use   Smoking status: Never    Passive exposure: Past   Smokeless tobacco: Never  Substance Use Topics   Alcohol use: No    Subjective:   Follow up on Type 2 Diabetes; at OV in October, Rybelsus was discussed and prescribed; PA was obtained and medication was prescribed; however, patient notes he was never able to start the medication; he has apparently been off of his Glimepiride for the past months- per patient, he didn't get any refill from [facility]; in reviewing medication with patient, it appears that he has not had any medication for diabetes in the past 3 months;  He has been checking his fasting blood sugars and averaging anywhere from 179-222; Denies any chest pain, shortness of breath, blurred vision or headache    Objective:  Vitals:   02/01/23 1032  BP: 122/64  Pulse: 67  Weight: 182 lb 6.4 oz (82.7 kg)  Height: 5\' 9"  (1.753 m)    General: Well developed, well nourished, in no acute distress  Skin : Warm and dry.  Head: Normocephalic and atraumatic  Lungs: Respirations unlabored;  Neurologic: Alert and oriented; speech intact; face symmetrical; moves all extremities well; CNII-XII intact without focal deficit   Assessment:  1. Type 2 diabetes mellitus with stage 3b chronic kidney disease, without long-term current use of insulin (HCC)     Plan:  Spoke with patient's pharmacy and he did fill the Rybelsus 3 mg- did not understand to call back here to ask for refill/ adjust dosage; he also stopped his Glimepiride;  Unfortunately, there appears  to be have been extensive confusion regarding patient's medications. He has not been on any diabetes medications for the past 3 months; Will update CBC, CMP, Hgba1c today- he again defers any type of injectable; assuming labs are not showing any emergent changes, will plan to re-start his amaryl and Rybelsus; close follow up is needed- plan for 1 month follow up;   No follow-ups on file.  Orders Placed This Encounter  Procedures   CBC with Differential/Platelet   Comp Met (CMET)   Hemoglobin A1c    Requested Prescriptions    No prescriptions requested or ordered in this encounter

## 2023-02-04 NOTE — Telephone Encounter (Signed)
Spoke with pt, pt states he is still waiting on a phone call to inform him Rx is ready for pick up, advised pt when I spoke with pharmacy it was stated the medication should be ready for pick up 02/04/2023. Advised pt to give the pharmacy a call. Scheduled pt a follow up appointment 02/19/2023.

## 2023-02-05 NOTE — Progress Notes (Signed)
I spoke to patient on Friday, January 17 regarding lab results and to let him know that Rybelsus and Glimepiride were both being called into [facility] for him. He continues to be adamant that he won't use insulin or any type of injectable. Please see phone call documented for 1/20 where my staff did reach out to check on him. I have also reached out to our clinical pharmacist for assistance.

## 2023-02-19 ENCOUNTER — Ambulatory Visit (INDEPENDENT_AMBULATORY_CARE_PROVIDER_SITE_OTHER): Payer: Medicare Other | Admitting: Family

## 2023-02-19 ENCOUNTER — Encounter: Payer: Self-pay | Admitting: Family

## 2023-02-19 VITALS — BP 138/80 | HR 63 | Ht 69.0 in | Wt 184.6 lb

## 2023-02-19 DIAGNOSIS — N1832 Chronic kidney disease, stage 3b: Secondary | ICD-10-CM | POA: Diagnosis not present

## 2023-02-19 DIAGNOSIS — Z7984 Long term (current) use of oral hypoglycemic drugs: Secondary | ICD-10-CM | POA: Diagnosis not present

## 2023-02-19 DIAGNOSIS — N1831 Chronic kidney disease, stage 3a: Secondary | ICD-10-CM

## 2023-02-19 DIAGNOSIS — E1122 Type 2 diabetes mellitus with diabetic chronic kidney disease: Secondary | ICD-10-CM

## 2023-02-19 DIAGNOSIS — E119 Type 2 diabetes mellitus without complications: Secondary | ICD-10-CM

## 2023-02-19 LAB — CBC WITH DIFFERENTIAL/PLATELET
Basophils Absolute: 0.1 10*3/uL (ref 0.0–0.1)
Basophils Relative: 0.7 % (ref 0.0–3.0)
Eosinophils Absolute: 0.2 10*3/uL (ref 0.0–0.7)
Eosinophils Relative: 3.3 % (ref 0.0–5.0)
HCT: 40.9 % (ref 39.0–52.0)
Hemoglobin: 13.2 g/dL (ref 13.0–17.0)
Lymphocytes Relative: 23.9 % (ref 12.0–46.0)
Lymphs Abs: 1.8 10*3/uL (ref 0.7–4.0)
MCHC: 32.2 g/dL (ref 30.0–36.0)
MCV: 86.1 fL (ref 78.0–100.0)
Monocytes Absolute: 0.7 10*3/uL (ref 0.1–1.0)
Monocytes Relative: 9.9 % (ref 3.0–12.0)
Neutro Abs: 4.7 10*3/uL (ref 1.4–7.7)
Neutrophils Relative %: 62.2 % (ref 43.0–77.0)
Platelets: 118 10*3/uL — ABNORMAL LOW (ref 150.0–400.0)
RBC: 4.75 Mil/uL (ref 4.22–5.81)
RDW: 14 % (ref 11.5–15.5)
WBC: 7.5 10*3/uL (ref 4.0–10.5)

## 2023-02-19 LAB — COMPREHENSIVE METABOLIC PANEL
ALT: 15 U/L (ref 0–53)
AST: 16 U/L (ref 0–37)
Albumin: 4 g/dL (ref 3.5–5.2)
Alkaline Phosphatase: 67 U/L (ref 39–117)
BUN: 28 mg/dL — ABNORMAL HIGH (ref 6–23)
CO2: 28 meq/L (ref 19–32)
Calcium: 8.9 mg/dL (ref 8.4–10.5)
Chloride: 105 meq/L (ref 96–112)
Creatinine, Ser: 2.49 mg/dL — ABNORMAL HIGH (ref 0.40–1.50)
GFR: 25.41 mL/min — ABNORMAL LOW (ref >60.00)
Glucose, Bld: 150 mg/dL — ABNORMAL HIGH (ref 70–99)
Potassium: 4.7 meq/L (ref 3.5–5.1)
Sodium: 141 meq/L (ref 135–145)
Total Bilirubin: 0.5 mg/dL (ref 0.2–1.2)
Total Protein: 6.8 g/dL (ref 6.0–8.3)

## 2023-02-19 MED ORDER — RYBELSUS 7 MG PO TABS: 7.0000 mg | ORAL_TABLET | Freq: Every day | ORAL | 0 refills | Status: DC

## 2023-02-19 NOTE — Patient Instructions (Signed)
You will finish up the prescription for the Rybelsus 3 mg like we discussed. You will be on that dosage for 30 days. Once the prescription for the 3 mg is completed, you will switch to 7 mg. That prescription was sent to [facility].

## 2023-02-19 NOTE — Progress Notes (Signed)
 Angel Benson is a 73 y.o. male with the following history as recorded in EpicCare:  Patient Active Problem List   Diagnosis Date Noted   Prostate cancer (HCC) 01/30/2022   Hematochezia    Decreased hemoglobin    Seizure-like activity (HCC) 11/24/2020   Hypertension associated with diabetes (HCC) 11/24/2020   Elevated CK 11/24/2020   CKD (chronic kidney disease), stage III (HCC) 11/24/2020   Left nephrolithiasis 11/24/2020   Leukocytosis 11/24/2020   B12 deficiency 07/14/2020   Anemia 07/14/2020   Thrombocytopenia (HCC) 07/14/2020   Cardiomyopathy, ischemic 09/27/2014   Routine general medical examination at a health care facility 02/01/2014   S/P CABG x 5 08/26/2013   Hyperlipidemia associated with type 2 diabetes mellitus (HCC) 08/25/2013   Coronary artery disease involving native coronary artery of native heart without angina pectoris 08/25/2013   Benign essential HTN 08/25/2013   Type 2 diabetes mellitus with chronic kidney disease, without long-term current use of insulin  (HCC) 08/25/2013   NSTEMI (non-ST elevated myocardial infarction) (HCC) 08/24/2013    Current Outpatient Medications  Medication Sig Dispense Refill   aspirin  81 MG tablet Take 81 mg by mouth daily.     atropine 1 % ophthalmic solution Place 1 drop into the right eye.     cholecalciferol (VITAMIN D3) 25 MCG (1000 UNIT) tablet Take 1,000 Units by mouth daily.     glimepiride  (AMARYL ) 1 MG tablet Take 1 tablet (1 mg total) by mouth daily with breakfast. 30 tablet 0   losartan  (COZAAR ) 25 MG tablet Take 1 tablet (25 mg total) by mouth daily. 90 tablet 3   metoprolol  succinate (TOPROL -XL) 50 MG 24 hr tablet Take 1 tablet (50 mg total) by mouth daily. Take with or immediately following a meal. 90 tablet 3   Multiple Vitamin (MULTIVITAMIN) tablet Take 1 tablet by mouth daily.     Netarsudil -Latanoprost  (ROCKLATAN ) 0.02-0.005 % SOLN Place 1 drop into both eyes at bedtime.     Omega-3 Fatty Acids (FISH OIL) 1000  MG CAPS Take 1 capsule by mouth at bedtime.     rosuvastatin  (CRESTOR ) 40 MG tablet Take 1 tablet (40 mg total) by mouth daily. 90 tablet 3   Semaglutide  (RYBELSUS ) 3 MG TABS Take 1 tablet (3 mg total) by mouth daily. 30 tablet 0   Semaglutide  (RYBELSUS ) 7 MG TABS Take 1 tablet (7 mg total) by mouth daily. 90 tablet 0   SIMBRINZA 1-0.2 % SUSP Place 1 drop into both eyes in the morning and at bedtime.     tamsulosin  (FLOMAX ) 0.4 MG CAPS capsule TAKE 1 CAPSULE(0.4 MG) BY MOUTH DAILY AFTER SUPPER 90 capsule 3   timolol  (TIMOPTIC ) 0.5 % ophthalmic solution Place 1 drop into both eyes every morning.     vitamin B-12 (CYANOCOBALAMIN ) 1000 MCG tablet Take 1,000 mcg by mouth daily.     No current facility-administered medications for this visit.    Allergies: Jardiance  [empagliflozin ]  Past Medical History:  Diagnosis Date   Anemia    CAD, multiple vessel    s/p CABG   Cataract    bilateral cataract removed   Chicken pox    Diabetes mellitus without complication (HCC)    Type 2   GERD (gastroesophageal reflux disease)    Glaucoma    Hypercholesteremia    Hypertension    Myocardial infarction (HCC) 2015   then CABG   Prostate CA (HCC) 2022   watching it - no sx planned at this time (09/05/2020)   Tuberculosis 1993  received tx post exposure to person with TB   Urinary tract bacterial infections    Vitamin B12 deficiency     Past Surgical History:  Procedure Laterality Date   CARDIAC CATHETERIZATION     CATARACT EXTRACTION, BILATERAL     CORONARY ARTERY BYPASS GRAFT N/A 08/26/2013   Procedure: CORONARY ARTERY BYPASS GRAFTING (CABG) times five, using left internal mammary artery, right and left greater saphenous vein;  Surgeon: Dallas KATHEE Jude, MD;  Location: [facility] OR;  Service: Open Heart Surgery;  Laterality: N/A;  LIMA-LAD; SEQ SVG-OM1-OM2-OM3; SVG-RCA    INTRAOPERATIVE TRANSESOPHAGEAL ECHOCARDIOGRAM N/A 08/26/2013   Procedure: INTRAOPERATIVE TRANSESOPHAGEAL ECHOCARDIOGRAM;   Surgeon: Dallas KATHEE Jude, MD;  Location: [facility] OR;  Service: Open Heart Surgery;  Laterality: N/A;    Family History  Problem Relation Age of Onset   Diabetes Mother    Alzheimer's disease Mother    Heart disease Father    Diabetes Father    Heart disease Brother    Colon polyps Neg Hx    Colon cancer Neg Hx    Esophageal cancer Neg Hx    Rectal cancer Neg Hx    Stomach cancer Neg Hx     Social History   Tobacco Use   Smoking status: Never    Passive exposure: Past   Smokeless tobacco: Never  Substance Use Topics   Alcohol use: No    Subjective:   Has only been on Rybelsus  for 4 days- our office checked on Rx on 1/20 and was told it was available for pick up; however patient notes he was not able to pick up before this past weekend;  Taking Glimepiride  with breakfast; taking Rybelsus  around 6:30 am and then waiting for about 30 minutes-1 hour to eat breakfast/ takes Glimepiride ;   Notes that fasting blood sugar in the past few days was in the high 140s;   Known CKD- creatinine level was elevated with last labs; patient unsure of last date of appointment with kidney doctor;    Objective:  Vitals:   02/19/23 1012  BP: 138/80  Pulse: 63  SpO2: 100%  Weight: 184 lb 9.6 oz (83.7 kg)  Height: 5' 9 (1.753 m)    General: Well developed, well nourished, in no acute distress  Skin : Warm and dry.  Head: Normocephalic and atraumatic  Eyes: Sclera and conjunctiva clear; pupils round and reactive to light; extraocular movements intact  Ears: External normal; canals clear; tympanic membranes normal  Oropharynx: Pink, supple. No suspicious lesions  Neck: Supple without thyromegaly, adenopathy  Lungs: Respirations unlabored; clear to auscultation bilaterally without wheeze, rales, rhonchi  CVS exam: normal rate and regular rhythm.  Neurologic: Alert and oriented; speech intact; face symmetrical; moves all extremities well; CNII-XII intact without focal deficit   Assessment:  1.  Stage 3a chronic kidney disease (HCC)     Plan:  Update labs today; patient appears to be tolerating Rybelsus  well but only on medication x 4 days; discussed that he will move to 7 mg dosage when completes the 3 mg prescription; hope to be able to stop Glimepiride  at that time; follow up in 6 weeks.   Return in about 6 weeks (around 04/02/2023).  Orders Placed This Encounter  Procedures   Comp Met (CMET)   CBC with Differential/Platelet    Requested Prescriptions   Signed Prescriptions Disp Refills   Semaglutide  (RYBELSUS ) 7 MG TABS 90 tablet 0    Sig: Take 1 tablet (7 mg total) by mouth daily.

## 2023-03-04 DIAGNOSIS — D631 Anemia in chronic kidney disease: Secondary | ICD-10-CM | POA: Diagnosis not present

## 2023-03-04 DIAGNOSIS — E1122 Type 2 diabetes mellitus with diabetic chronic kidney disease: Secondary | ICD-10-CM | POA: Diagnosis not present

## 2023-03-04 DIAGNOSIS — N183 Chronic kidney disease, stage 3 unspecified: Secondary | ICD-10-CM | POA: Diagnosis not present

## 2023-03-04 DIAGNOSIS — I129 Hypertensive chronic kidney disease with stage 1 through stage 4 chronic kidney disease, or unspecified chronic kidney disease: Secondary | ICD-10-CM | POA: Diagnosis not present

## 2023-03-04 DIAGNOSIS — N2581 Secondary hyperparathyroidism of renal origin: Secondary | ICD-10-CM | POA: Diagnosis not present

## 2023-03-18 ENCOUNTER — Other Ambulatory Visit: Payer: Self-pay | Admitting: Family

## 2023-03-18 ENCOUNTER — Telehealth: Payer: Self-pay | Admitting: *Deleted

## 2023-03-18 NOTE — Telephone Encounter (Signed)
 Patient was identified as falling into the [place] Measure - Diabetes.   Patient was: Appointment scheduled with primary care provider in the next 30 days.

## 2023-04-02 ENCOUNTER — Encounter: Payer: Self-pay | Admitting: Family

## 2023-04-02 ENCOUNTER — Ambulatory Visit (INDEPENDENT_AMBULATORY_CARE_PROVIDER_SITE_OTHER): Payer: Medicare Other | Admitting: Family

## 2023-04-02 VITALS — BP 138/64 | HR 82 | Ht 69.0 in | Wt 183.8 lb

## 2023-04-02 DIAGNOSIS — E1122 Type 2 diabetes mellitus with diabetic chronic kidney disease: Secondary | ICD-10-CM

## 2023-04-02 DIAGNOSIS — Z7984 Long term (current) use of oral hypoglycemic drugs: Secondary | ICD-10-CM

## 2023-04-02 DIAGNOSIS — N1832 Chronic kidney disease, stage 3b: Secondary | ICD-10-CM

## 2023-04-04 DIAGNOSIS — D631 Anemia in chronic kidney disease: Secondary | ICD-10-CM | POA: Diagnosis not present

## 2023-04-04 DIAGNOSIS — I10 Essential (primary) hypertension: Secondary | ICD-10-CM | POA: Diagnosis not present

## 2023-04-04 DIAGNOSIS — E1122 Type 2 diabetes mellitus with diabetic chronic kidney disease: Secondary | ICD-10-CM | POA: Diagnosis not present

## 2023-04-04 DIAGNOSIS — I129 Hypertensive chronic kidney disease with stage 1 through stage 4 chronic kidney disease, or unspecified chronic kidney disease: Secondary | ICD-10-CM | POA: Diagnosis not present

## 2023-04-04 DIAGNOSIS — N189 Chronic kidney disease, unspecified: Secondary | ICD-10-CM | POA: Diagnosis not present

## 2023-04-04 DIAGNOSIS — N183 Chronic kidney disease, stage 3 unspecified: Secondary | ICD-10-CM | POA: Diagnosis not present

## 2023-04-04 DIAGNOSIS — N2581 Secondary hyperparathyroidism of renal origin: Secondary | ICD-10-CM | POA: Diagnosis not present

## 2023-04-05 NOTE — Progress Notes (Signed)
 Angel Benson is a 73 y.o. male with the following history as recorded in EpicCare:  Patient Active Problem List   Diagnosis Date Noted   Prostate cancer (HCC) 01/30/2022   Hematochezia    Decreased hemoglobin    Seizure-like activity (HCC) 11/24/2020   Hypertension associated with diabetes (HCC) 11/24/2020   Elevated CK 11/24/2020   CKD (chronic kidney disease), stage III (HCC) 11/24/2020   Left nephrolithiasis 11/24/2020   Leukocytosis 11/24/2020   B12 deficiency 07/14/2020   Anemia 07/14/2020   Thrombocytopenia (HCC) 07/14/2020   Cardiomyopathy, ischemic 09/27/2014   Routine general medical examination at a health care facility 02/01/2014   S/P CABG x 5 08/26/2013   Hyperlipidemia associated with type 2 diabetes mellitus (HCC) 08/25/2013   Coronary artery disease involving native coronary artery of native heart without angina pectoris 08/25/2013   Benign essential HTN 08/25/2013   Type 2 diabetes mellitus with chronic kidney disease, without long-term current use of insulin (HCC) 08/25/2013   NSTEMI (non-ST elevated myocardial infarction) (HCC) 08/24/2013    Current Outpatient Medications  Medication Sig Dispense Refill   aspirin 81 MG tablet Take 81 mg by mouth daily.     atropine 1 % ophthalmic solution Place 1 drop into the right eye.     cholecalciferol (VITAMIN D3) 25 MCG (1000 UNIT) tablet Take 1,000 Units by mouth daily.     losartan (COZAAR) 25 MG tablet Take 1 tablet (25 mg total) by mouth daily. 90 tablet 3   metoprolol succinate (TOPROL-XL) 50 MG 24 hr tablet Take 1 tablet (50 mg total) by mouth daily. Take with or immediately following a meal. 90 tablet 3   Multiple Vitamin (MULTIVITAMIN) tablet Take 1 tablet by mouth daily.     Netarsudil-Latanoprost (ROCKLATAN) 0.02-0.005 % SOLN Place 1 drop into both eyes at bedtime.     Omega-3 Fatty Acids (FISH OIL) 1000 MG CAPS Take 1 capsule by mouth at bedtime.     rosuvastatin (CRESTOR) 40 MG tablet Take 1 tablet (40 mg  total) by mouth daily. 90 tablet 3   Semaglutide (RYBELSUS) 7 MG TABS Take 1 tablet (7 mg total) by mouth daily. 90 tablet 0   SIMBRINZA 1-0.2 % SUSP Place 1 drop into both eyes in the morning and at bedtime.     tamsulosin (FLOMAX) 0.4 MG CAPS capsule TAKE 1 CAPSULE(0.4 MG) BY MOUTH DAILY AFTER SUPPER 90 capsule 3   timolol (TIMOPTIC) 0.5 % ophthalmic solution Place 1 drop into both eyes every morning.     vitamin B-12 (CYANOCOBALAMIN) 1000 MCG tablet Take 1,000 mcg by mouth daily.     No current facility-administered medications for this visit.    Allergies: Jardiance [empagliflozin]  Past Medical History:  Diagnosis Date   Anemia    CAD, multiple vessel    s/p CABG   Cataract    bilateral cataract removed   Chicken pox    Diabetes mellitus without complication (HCC)    Type 2   GERD (gastroesophageal reflux disease)    Glaucoma    Hypercholesteremia    Hypertension    Myocardial infarction (HCC) 2015   then CABG   Prostate CA (HCC) 2022   "watching it" - no sx planned at this time (09/05/2020)   Tuberculosis 1993   received tx post exposure to person with TB   Urinary tract bacterial infections    Vitamin B12 deficiency     Past Surgical History:  Procedure Laterality Date   CARDIAC CATHETERIZATION  CATARACT EXTRACTION, BILATERAL     CORONARY ARTERY BYPASS GRAFT N/A 08/26/2013   Procedure: CORONARY ARTERY BYPASS GRAFTING (CABG) times five, using left internal mammary artery, right and left greater saphenous vein;  Surgeon: Delight Ovens, MD;  Location: [facility] OR;  Service: Open Heart Surgery;  Laterality: N/A;  LIMA-LAD; SEQ SVG-OM1-OM2-OM3; SVG-RCA    INTRAOPERATIVE TRANSESOPHAGEAL ECHOCARDIOGRAM N/A 08/26/2013   Procedure: INTRAOPERATIVE TRANSESOPHAGEAL ECHOCARDIOGRAM;  Surgeon: Delight Ovens, MD;  Location: [facility] OR;  Service: Open Heart Surgery;  Laterality: N/A;    Family History  Problem Relation Age of Onset   Diabetes Mother    Alzheimer's disease Mother     Heart disease Father    Diabetes Father    Heart disease Brother    Colon polyps Neg Hx    Colon cancer Neg Hx    Esophageal cancer Neg Hx    Rectal cancer Neg Hx    Stomach cancer Neg Hx     Social History   Tobacco Use   Smoking status: Never    Passive exposure: Past   Smokeless tobacco: Never  Substance Use Topics   Alcohol use: No    Subjective:   4-6 week follow up on Type 2 Diabetes; has been able to start Rybelsus; currently on 7 mg daily; has stopped taking Glimepiride; has also been able to meet with nephrology; Denies any chest pain, shortness of breath, blurred vision or headache   Objective:  Vitals:   04/02/23 1026  BP: 138/64  Pulse: 82  SpO2: 97%  Weight: 183 lb 12.8 oz (83.4 kg)  Height: 5\' 9"  (1.753 m)    General: Well developed, well nourished, in no acute distress  Skin : Warm and dry.  Head: Normocephalic and atraumatic  Lungs: Respirations unlabored; clear to auscultation bilaterally without wheeze, rales, rhonchi  CVS exam: normal rate and regular rhythm.  Neurologic: Alert and oriented; speech intact; face symmetrical; moves all extremities well; CNII-XII intact without focal deficit   Assessment:  1. Type 2 diabetes mellitus with stage 3b chronic kidney disease, without long-term current use of insulin (HCC)     Plan:  Reviewed home readings- responding well to Rybelsus 7 mg daily; will continue for now; he is scheduled to see his nephrologist again in the next week so will hold labs today as they will do at time of OV; follow up in 3 months, sooner prn.   Return in about 3 months (around 07/03/2023).  No orders of the defined types were placed in this encounter.   Requested Prescriptions    No prescriptions requested or ordered in this encounter

## 2023-04-11 ENCOUNTER — Other Ambulatory Visit: Payer: Self-pay | Admitting: Family

## 2023-04-11 MED ORDER — TAMSULOSIN HCL 0.4 MG PO CAPS: ORAL_CAPSULE | ORAL | 3 refills | Status: AC

## 2023-04-19 LAB — HEMOGLOBIN A1C: Hemoglobin A1C: 7.2

## 2023-05-16 ENCOUNTER — Other Ambulatory Visit: Payer: Self-pay | Admitting: Family

## 2023-05-21 DIAGNOSIS — H43812 Vitreous degeneration, left eye: Secondary | ICD-10-CM | POA: Diagnosis not present

## 2023-05-21 DIAGNOSIS — Z961 Presence of intraocular lens: Secondary | ICD-10-CM | POA: Diagnosis not present

## 2023-05-21 DIAGNOSIS — H401131 Primary open-angle glaucoma, bilateral, mild stage: Secondary | ICD-10-CM | POA: Diagnosis not present

## 2023-05-21 DIAGNOSIS — E119 Type 2 diabetes mellitus without complications: Secondary | ICD-10-CM | POA: Diagnosis not present

## 2023-05-21 DIAGNOSIS — H04123 Dry eye syndrome of bilateral lacrimal glands: Secondary | ICD-10-CM | POA: Diagnosis not present

## 2023-05-21 LAB — HM DIABETES EYE EXAM

## 2023-07-04 ENCOUNTER — Encounter: Payer: Self-pay | Admitting: Family

## 2023-07-04 ENCOUNTER — Ambulatory Visit: Admitting: Family

## 2023-07-04 ENCOUNTER — Ambulatory Visit: Payer: Self-pay | Admitting: Family

## 2023-07-04 VITALS — BP 122/64 | HR 65 | Ht 69.0 in | Wt 181.0 lb

## 2023-07-04 DIAGNOSIS — Z7984 Long term (current) use of oral hypoglycemic drugs: Secondary | ICD-10-CM

## 2023-07-04 DIAGNOSIS — N1832 Chronic kidney disease, stage 3b: Secondary | ICD-10-CM

## 2023-07-04 DIAGNOSIS — E1122 Type 2 diabetes mellitus with diabetic chronic kidney disease: Secondary | ICD-10-CM | POA: Diagnosis not present

## 2023-07-04 DIAGNOSIS — N1831 Chronic kidney disease, stage 3a: Secondary | ICD-10-CM | POA: Diagnosis not present

## 2023-07-04 LAB — COMPREHENSIVE METABOLIC PANEL WITH GFR
ALT: 22 U/L (ref 0–53)
AST: 20 U/L (ref 0–37)
Albumin: 4.1 g/dL (ref 3.5–5.2)
Alkaline Phosphatase: 51 U/L (ref 39–117)
BUN: 28 mg/dL — ABNORMAL HIGH (ref 6–23)
CO2: 30 meq/L (ref 19–32)
Calcium: 8.9 mg/dL (ref 8.4–10.5)
Chloride: 110 meq/L (ref 96–112)
Creatinine, Ser: 2.03 mg/dL — ABNORMAL HIGH (ref 0.40–1.50)
GFR: 32.39 mL/min — ABNORMAL LOW (ref >60.00)
Glucose, Bld: 116 mg/dL — ABNORMAL HIGH (ref 70–99)
Potassium: 4.6 meq/L (ref 3.5–5.1)
Sodium: 143 meq/L (ref 135–145)
Total Bilirubin: 0.4 mg/dL (ref 0.2–1.2)
Total Protein: 6.6 g/dL (ref 6.0–8.3)

## 2023-07-04 LAB — CBC WITH DIFFERENTIAL/PLATELET
Basophils Absolute: 0 10*3/uL (ref 0.0–0.1)
Basophils Relative: 0.6 % (ref 0.0–3.0)
Eosinophils Absolute: 0.3 10*3/uL (ref 0.0–0.7)
Eosinophils Relative: 4.1 % (ref 0.0–5.0)
HCT: 40.1 % (ref 39.0–52.0)
Hemoglobin: 12.9 g/dL — ABNORMAL LOW (ref 13.0–17.0)
Lymphocytes Relative: 26.6 % (ref 12.0–46.0)
Lymphs Abs: 1.8 10*3/uL (ref 0.7–4.0)
MCHC: 32.1 g/dL (ref 30.0–36.0)
MCV: 84.6 fl (ref 78.0–100.0)
Monocytes Absolute: 0.7 10*3/uL (ref 0.1–1.0)
Monocytes Relative: 10.2 % (ref 3.0–12.0)
Neutro Abs: 3.9 10*3/uL (ref 1.4–7.7)
Neutrophils Relative %: 58.5 % (ref 43.0–77.0)
Platelets: 131 10*3/uL — ABNORMAL LOW (ref 150.0–400.0)
RBC: 4.74 Mil/uL (ref 4.22–5.81)
RDW: 14.6 % (ref 11.5–15.5)
WBC: 6.6 10*3/uL (ref 4.0–10.5)

## 2023-07-04 LAB — HEMOGLOBIN A1C: Hgb A1c MFr Bld: 6.6 % — ABNORMAL HIGH (ref 4.6–6.5)

## 2023-07-04 NOTE — Progress Notes (Signed)
 Angel Benson is a 73 y.o. male with the following history as recorded in EpicCare:  Patient Active Problem List   Diagnosis Date Noted   Prostate cancer (HCC) 01/30/2022   Hematochezia    Decreased hemoglobin    Seizure-like activity (HCC) 11/24/2020   Hypertension associated with diabetes (HCC) 11/24/2020   Elevated CK 11/24/2020   CKD (chronic kidney disease), stage III (HCC) 11/24/2020   Left nephrolithiasis 11/24/2020   Leukocytosis 11/24/2020   B12 deficiency 07/14/2020   Anemia 07/14/2020   Thrombocytopenia (HCC) 07/14/2020   Cardiomyopathy, ischemic 09/27/2014   Routine general medical examination at a health care facility 02/01/2014   S/P CABG x 5 08/26/2013   Hyperlipidemia associated with type 2 diabetes mellitus (HCC) 08/25/2013   Coronary artery disease involving native coronary artery of native heart without angina pectoris 08/25/2013   Benign essential HTN 08/25/2013   Type 2 diabetes mellitus with chronic kidney disease, without long-term current use of insulin  (HCC) 08/25/2013   NSTEMI (non-ST elevated myocardial infarction) (HCC) 08/24/2013    Current Outpatient Medications  Medication Sig Dispense Refill   aspirin  81 MG tablet Take 81 mg by mouth daily.     atropine 1 % ophthalmic solution Place 1 drop into the right eye.     cholecalciferol (VITAMIN D3) 25 MCG (1000 UNIT) tablet Take 1,000 Units by mouth daily.     losartan  (COZAAR ) 25 MG tablet Take 1 tablet (25 mg total) by mouth daily. 90 tablet 3   metoprolol  succinate (TOPROL -XL) 50 MG 24 hr tablet Take 1 tablet (50 mg total) by mouth daily. Take with or immediately following a meal. 90 tablet 3   Multiple Vitamin (MULTIVITAMIN) tablet Take 1 tablet by mouth daily.     Netarsudil -Latanoprost  (ROCKLATAN ) 0.02-0.005 % SOLN Place 1 drop into both eyes at bedtime.     Omega-3 Fatty Acids (FISH OIL) 1000 MG CAPS Take 1 capsule by mouth at bedtime.     rosuvastatin  (CRESTOR ) 40 MG tablet Take 1 tablet (40 mg  total) by mouth daily. 90 tablet 3   RYBELSUS  7 MG TABS TAKE 1 TABLET DAILY 90 tablet 3   SIMBRINZA 1-0.2 % SUSP Place 1 drop into both eyes in the morning and at bedtime.     tamsulosin  (FLOMAX ) 0.4 MG CAPS capsule TAKE 1 CAPSULE(0.4 MG) BY MOUTH DAILY AFTER SUPPER 90 capsule 3   timolol  (TIMOPTIC ) 0.5 % ophthalmic solution Place 1 drop into both eyes every morning.     vitamin B-12 (CYANOCOBALAMIN ) 1000 MCG tablet Take 1,000 mcg by mouth daily.     No current facility-administered medications for this visit.    Allergies: Jardiance  [empagliflozin ]  Past Medical History:  Diagnosis Date   Anemia    CAD, multiple vessel    s/p CABG   Cataract    bilateral cataract removed   Chicken pox    Diabetes mellitus without complication (HCC)    Type 2   GERD (gastroesophageal reflux disease)    Glaucoma    Hypercholesteremia    Hypertension    Myocardial infarction (HCC) 2015   then CABG   Prostate CA (HCC) 2022   watching it - no sx planned at this time (09/05/2020)   Tuberculosis 1993   received tx post exposure to person with TB   Urinary tract bacterial infections    Vitamin B12 deficiency     Past Surgical History:  Procedure Laterality Date   CARDIAC CATHETERIZATION     CATARACT EXTRACTION, BILATERAL  CORONARY ARTERY BYPASS GRAFT N/A 08/26/2013   Procedure: CORONARY ARTERY BYPASS GRAFTING (CABG) times five, using left internal mammary artery, right and left greater saphenous vein;  Surgeon: Norita Beauvais, MD;  Location: [facility] OR;  Service: Open Heart Surgery;  Laterality: N/A;  LIMA-LAD; SEQ SVG-OM1-OM2-OM3; SVG-RCA    INTRAOPERATIVE TRANSESOPHAGEAL ECHOCARDIOGRAM N/A 08/26/2013   Procedure: INTRAOPERATIVE TRANSESOPHAGEAL ECHOCARDIOGRAM;  Surgeon: Norita Beauvais, MD;  [facility] OR;  Service: Open Heart Surgery;  Laterality: N/A;    Family History  Problem Relation Age of Onset   Diabetes Mother    Alzheimer's disease Mother    Heart disease Father    Diabetes  Father    Heart disease Brother    Colon polyps Neg Hx    Colon cancer Neg Hx    Esophageal cancer Neg Hx    Rectal cancer Neg Hx    Stomach cancer Neg Hx     Social History   Tobacco Use   Smoking status: Never    Passive exposure: Past   Smokeless tobacco: Never  Substance Use Topics   Alcohol use: No    Subjective:   3 month follow up on Type 2 Diabetes; has seen nephrologist since last here- they checked Hgba1c and it was down to 7.2 in April; scheduled to see nephrologist again next month; Denies any chest pain, shortness of breath, blurred vision or headache; Will be seeing his ophthalmologist next month;     Objective:  Vitals:   07/04/23 0947  BP: 122/64  Pulse: 65  SpO2: 99%  Weight: 181 lb (82.1 kg)  Height: 5' 9 (1.753 m)    General: Well developed, well nourished, in no acute distress  Skin : Warm and dry.  Head: Normocephalic and atraumatic  Lungs: Respirations unlabored; clear to auscultation bilaterally without wheeze, rales, rhonchi  CVS exam: normal rate and regular rhythm.  Extremities: No edema, cyanosis, clubbing  Vessels: Symmetric bilaterally  Neurologic: Alert and oriented; speech intact; face symmetrical; moves all extremities well; CNII-XII intact without focal deficit   Assessment:  1. Type 2 diabetes mellitus with stage 3b chronic kidney disease, without long-term current use of insulin  (HCC)   2. Stage 3a chronic kidney disease (HCC)     Plan:  Good improvement in diabetes control; update labs today; continue Rybelsus  7 mg daily; follow up in 6 months, sooner prn.   Return in about 6 months (around 01/03/2024) for follow up.  Orders Placed This Encounter  Procedures   CBC with Differential/Platelet   Comp Met (CMET)   Hemoglobin A1c    Requested Prescriptions    No prescriptions requested or ordered in this encounter

## 2023-07-04 NOTE — Patient Instructions (Signed)
 Please schedule a follow up with Dr. Maximo Spar for your yearly follow up;  Keep seeing Dr. Zelda Hickman regularly;  Okay to see me in 6 months;

## 2023-08-05 DIAGNOSIS — N2581 Secondary hyperparathyroidism of renal origin: Secondary | ICD-10-CM | POA: Diagnosis not present

## 2023-08-05 DIAGNOSIS — D631 Anemia in chronic kidney disease: Secondary | ICD-10-CM | POA: Diagnosis not present

## 2023-08-05 DIAGNOSIS — I129 Hypertensive chronic kidney disease with stage 1 through stage 4 chronic kidney disease, or unspecified chronic kidney disease: Secondary | ICD-10-CM | POA: Diagnosis not present

## 2023-08-05 DIAGNOSIS — E1122 Type 2 diabetes mellitus with diabetic chronic kidney disease: Secondary | ICD-10-CM | POA: Diagnosis not present

## 2023-08-05 DIAGNOSIS — N183 Chronic kidney disease, stage 3 unspecified: Secondary | ICD-10-CM | POA: Diagnosis not present

## 2023-09-17 DIAGNOSIS — H0102B Squamous blepharitis left eye, upper and lower eyelids: Secondary | ICD-10-CM | POA: Diagnosis not present

## 2023-09-17 DIAGNOSIS — H401131 Primary open-angle glaucoma, bilateral, mild stage: Secondary | ICD-10-CM | POA: Diagnosis not present

## 2023-09-17 DIAGNOSIS — H0102A Squamous blepharitis right eye, upper and lower eyelids: Secondary | ICD-10-CM | POA: Diagnosis not present

## 2023-09-17 DIAGNOSIS — H04123 Dry eye syndrome of bilateral lacrimal glands: Secondary | ICD-10-CM | POA: Diagnosis not present

## 2023-09-20 ENCOUNTER — Telehealth: Payer: Self-pay | Admitting: Family

## 2023-09-20 NOTE — Telephone Encounter (Signed)
 Copied from CRM [id number]. Topic: Medicare AWV >> Sep 20, 2023 10:01 AM Nathanel DEL wrote: Reason for CRM: Called LVM 09/20/2023 to schedule AWV. Please schedule office or virtual visits.  Nathanel Paschal; Care Guide Ambulatory Clinical Support [facility] Direct Dial: [phone]

## 2023-11-29 ENCOUNTER — Other Ambulatory Visit: Payer: Self-pay | Admitting: Student

## 2023-11-29 NOTE — Telephone Encounter (Unsigned)
 Copied from CRM #[id number]. Topic: Clinical - Medication Refill >> Nov 29, 2023  9:06 AM Jasmin G wrote: Medication: rosuvastatin  (CRESTOR ) 40 MG tablet, metoprolol  succinate (TOPROL -XL) 50 MG 24 hr tablet, losartan  (COZAAR ) 25 MG tablet  Has the patient contacted their pharmacy? Yes (Agent: If no, request that the patient contact the pharmacy for the refill. If patient does not wish to contact the pharmacy document the reason why and proceed with request.) (Agent: If yes, when and what did the pharmacy advise?)  This is the patient's preferred pharmacy:  [id number] [place], [place] - [address] AT [facility] & [address] [place] [address] Phone: [phone] Fax: [phone]  Is this the correct pharmacy for this prescription? Yes If no, delete pharmacy and type the correct one.   Has the prescription been filled recently? Yes  Is the patient out of the medication? Yes  Has the patient been seen for an appointment in the last year OR does the patient have an upcoming appointment? Yes  Can we respond through MyChart? No  Agent: Please be advised that Rx refills may take up to 3 business days. We ask that you follow-up with your pharmacy.

## 2023-11-30 MED ORDER — ROSUVASTATIN CALCIUM 40 MG PO TABS: 40.0000 mg | ORAL_TABLET | Freq: Every day | ORAL | 0 refills | Status: DC

## 2023-11-30 MED ORDER — LOSARTAN POTASSIUM 25 MG PO TABS: 25.0000 mg | ORAL_TABLET | Freq: Every day | ORAL | 0 refills | Status: DC

## 2023-12-04 ENCOUNTER — Other Ambulatory Visit: Payer: Self-pay | Admitting: *Deleted

## 2023-12-04 MED ORDER — ROSUVASTATIN CALCIUM 40 MG PO TABS: 40.0000 mg | ORAL_TABLET | Freq: Every day | ORAL | 0 refills | Status: DC

## 2023-12-10 ENCOUNTER — Other Ambulatory Visit: Payer: Self-pay | Admitting: *Deleted

## 2023-12-10 MED ORDER — LOSARTAN POTASSIUM 25 MG PO TABS: 25.0000 mg | ORAL_TABLET | Freq: Every day | ORAL | 0 refills | Status: DC

## 2023-12-10 MED ORDER — METOPROLOL SUCCINATE ER 50 MG PO TB24
50.0000 mg | ORAL_TABLET | Freq: Every day | ORAL | 0 refills | Status: AC
Start: 2023-12-10 — End: ?

## 2023-12-27 ENCOUNTER — Encounter: Payer: Self-pay | Admitting: Physician Assistant

## 2024-01-03 ENCOUNTER — Ambulatory Visit (INDEPENDENT_AMBULATORY_CARE_PROVIDER_SITE_OTHER): Admitting: Student

## 2024-01-03 ENCOUNTER — Ambulatory Visit: Admitting: Family

## 2024-01-03 ENCOUNTER — Encounter: Payer: Self-pay | Admitting: Student

## 2024-01-03 VITALS — BP 124/68 | HR 67 | Ht 69.0 in | Wt 181.2 lb

## 2024-01-03 DIAGNOSIS — C61 Malignant neoplasm of prostate: Secondary | ICD-10-CM

## 2024-01-03 DIAGNOSIS — I2510 Atherosclerotic heart disease of native coronary artery without angina pectoris: Secondary | ICD-10-CM

## 2024-01-03 DIAGNOSIS — I152 Hypertension secondary to endocrine disorders: Secondary | ICD-10-CM

## 2024-01-03 DIAGNOSIS — Z23 Encounter for immunization: Secondary | ICD-10-CM | POA: Diagnosis not present

## 2024-01-03 DIAGNOSIS — E538 Deficiency of other specified B group vitamins: Secondary | ICD-10-CM | POA: Diagnosis not present

## 2024-01-03 DIAGNOSIS — D631 Anemia in chronic kidney disease: Secondary | ICD-10-CM

## 2024-01-03 DIAGNOSIS — E1169 Type 2 diabetes mellitus with other specified complication: Secondary | ICD-10-CM | POA: Diagnosis not present

## 2024-01-03 DIAGNOSIS — N183 Chronic kidney disease, stage 3 unspecified: Secondary | ICD-10-CM

## 2024-01-03 DIAGNOSIS — E559 Vitamin D deficiency, unspecified: Secondary | ICD-10-CM | POA: Diagnosis not present

## 2024-01-03 DIAGNOSIS — N1832 Chronic kidney disease, stage 3b: Secondary | ICD-10-CM

## 2024-01-03 DIAGNOSIS — E785 Hyperlipidemia, unspecified: Secondary | ICD-10-CM

## 2024-01-03 DIAGNOSIS — E1122 Type 2 diabetes mellitus with diabetic chronic kidney disease: Secondary | ICD-10-CM | POA: Diagnosis not present

## 2024-01-03 DIAGNOSIS — E1159 Type 2 diabetes mellitus with other circulatory complications: Secondary | ICD-10-CM

## 2024-01-03 DIAGNOSIS — N1831 Chronic kidney disease, stage 3a: Secondary | ICD-10-CM

## 2024-01-03 LAB — CBC WITH DIFFERENTIAL/PLATELET
Basophils Absolute: 0 K/uL (ref 0.0–0.1)
Basophils Relative: 0.7 % (ref 0.0–3.0)
Eosinophils Absolute: 0.2 K/uL (ref 0.0–0.7)
Eosinophils Relative: 3.9 % (ref 0.0–5.0)
HCT: 42.3 % (ref 39.0–52.0)
Hemoglobin: 13.8 g/dL (ref 13.0–17.0)
Lymphocytes Relative: 28.1 % (ref 12.0–46.0)
Lymphs Abs: 1.7 K/uL (ref 0.7–4.0)
MCHC: 32.5 g/dL (ref 30.0–36.0)
MCV: 85.6 fl (ref 78.0–100.0)
Monocytes Absolute: 0.7 K/uL (ref 0.1–1.0)
Monocytes Relative: 11.1 % (ref 3.0–12.0)
Neutro Abs: 3.5 K/uL (ref 1.4–7.7)
Neutrophils Relative %: 56.2 % (ref 43.0–77.0)
Platelets: 144 K/uL — ABNORMAL LOW (ref 150.0–400.0)
RBC: 4.95 Mil/uL (ref 4.22–5.81)
RDW: 16.1 % — ABNORMAL HIGH (ref 11.5–15.5)
WBC: 6.2 K/uL (ref 4.0–10.5)

## 2024-01-03 LAB — COMPREHENSIVE METABOLIC PANEL WITH GFR
ALT: 18 U/L (ref 3–53)
AST: 16 U/L (ref 5–37)
Albumin: 4 g/dL (ref 3.5–5.2)
Alkaline Phosphatase: 57 U/L (ref 39–117)
BUN: 23 mg/dL (ref 6–23)
CO2: 31 meq/L (ref 19–32)
Calcium: 9.3 mg/dL (ref 8.4–10.5)
Chloride: 106 meq/L (ref 96–112)
Creatinine, Ser: 1.84 mg/dL — ABNORMAL HIGH (ref 0.40–1.50)
GFR: 36.31 mL/min — ABNORMAL LOW
Glucose, Bld: 96 mg/dL (ref 70–99)
Potassium: 4.7 meq/L (ref 3.5–5.1)
Sodium: 142 meq/L (ref 135–145)
Total Bilirubin: 0.5 mg/dL (ref 0.2–1.2)
Total Protein: 6.9 g/dL (ref 6.0–8.3)

## 2024-01-03 LAB — LIPID PANEL
Cholesterol: 245 mg/dL — ABNORMAL HIGH (ref 28–200)
HDL: 45.8 mg/dL
LDL Cholesterol: 171 mg/dL — ABNORMAL HIGH (ref 10–99)
NonHDL: 199.39
Total CHOL/HDL Ratio: 5
Triglycerides: 144 mg/dL (ref 10.0–149.0)
VLDL: 28.8 mg/dL (ref 0.0–40.0)

## 2024-01-03 LAB — VITAMIN B12: Vitamin B-12: 1009 pg/mL — ABNORMAL HIGH (ref 211–911)

## 2024-01-03 LAB — VITAMIN D 25 HYDROXY (VIT D DEFICIENCY, FRACTURES): VITD: 49.64 ng/mL (ref 30.00–100.00)

## 2024-01-03 LAB — PSA: PSA: 9.35 ng/mL — ABNORMAL HIGH (ref 0.10–4.00)

## 2024-01-03 LAB — TSH: TSH: 1.78 u[IU]/mL (ref 0.35–5.50)

## 2024-01-03 LAB — HEMOGLOBIN A1C: Hgb A1c MFr Bld: 6.8 % — ABNORMAL HIGH (ref 4.6–6.5)

## 2024-01-03 MED ORDER — ROSUVASTATIN CALCIUM 40 MG PO TABS: 40.0000 mg | ORAL_TABLET | Freq: Every day | ORAL | 0 refills | Status: AC

## 2024-01-03 MED ORDER — LOSARTAN POTASSIUM 25 MG PO TABS: 25.0000 mg | ORAL_TABLET | Freq: Every day | ORAL | 0 refills | Status: AC

## 2024-01-03 NOTE — Assessment & Plan Note (Addendum)
 Managed with Rybelsus  On statin, ARB DM Foot exam updated hgba1c acceptable, minimize simple carbs. Increase exercise as tolerated. Continue current meds

## 2024-01-03 NOTE — Assessment & Plan Note (Signed)
 Supplement and monitor

## 2024-01-03 NOTE — Assessment & Plan Note (Signed)
 Tolerating statin. Encourage heart healthy diet such as MIND or DASH diet, increase exercise, avoid trans fats, simple carbohydrates and processed foods, consider a krill or fish or flaxseed oil cap daily.

## 2024-01-03 NOTE — Assessment & Plan Note (Signed)
 Well controlled, no changes to meds. Encouraged heart healthy diet such as the DASH diet and exercise as tolerated.

## 2024-01-03 NOTE — Assessment & Plan Note (Signed)
 Asymptomatic  Update labs

## 2024-01-03 NOTE — Assessment & Plan Note (Signed)
 Following with nephrology, [facility].

## 2024-01-03 NOTE — Assessment & Plan Note (Signed)
 S/p CABG. Asymptomatic.  On ASA and Statin

## 2024-01-03 NOTE — Assessment & Plan Note (Signed)
 Update PSA Refer to Urology

## 2024-01-03 NOTE — Progress Notes (Signed)
 "  Subjective:     Patient ID: Angel Benson, male    DOB: 11-30-1950, 73 y.o.   MRN: [id number]  No chief complaint on file.   HPI  Discussed the use of AI scribe software for clinical note transcription with the patient, who gave verbal consent to proceed.   History of Present Illness      History of Present Illness Angel Benson is a 73 year old male who presents for TOC and routine follow-up. He is a retired Investment Banker, Operational. He is single, has one adult child that lives in [place] . Overall feeling well.   He reports Hx prostate cancer, was previously seen by urology but has not followed up with Urology in a few years. He takes tamsulosin  for BPH. Denies urinary symptoms.  He manages diabetes with oral semaglutide      PMHx- CAD s/p CABG, DM Type 2, Prostate Cancer, CKD, HTN, HLD  Following with:  -Nephrology- Dr. Dennise Hoes with [facility]  Patient denies fever, chills, SOB, CP, palpitations, dyspnea, edema, HA, vision changes, N/V/D, abdominal pain, urinary symptoms, rash, weight changes, and recent illness or hospitalizations.         Health Maintenance Due  Topic Date Due   Diabetic kidney evaluation - Urine ACR  Never done   Medicare Annual Wellness (AWV)  01/18/2023   HEMOGLOBIN A1C  01/03/2024    Past Medical History:  Diagnosis Date   Anemia    CAD, multiple vessel    s/p CABG   Cataract    bilateral cataract removed   Chicken pox    Diabetes mellitus without complication (HCC)    Type 2   GERD (gastroesophageal reflux disease)    Glaucoma    Hypercholesteremia    Hypertension    Myocardial infarction (HCC) 2015   then CABG   Prostate CA (HCC) 2022   watching it - no sx planned at this time (09/05/2020)   Tuberculosis 1993   received tx post exposure to person with TB   Urinary tract bacterial infections    Vitamin B12 deficiency     Past Surgical History:  Procedure Laterality Date   CARDIAC CATHETERIZATION     CATARACT  EXTRACTION, BILATERAL     CORONARY ARTERY BYPASS GRAFT N/A 08/26/2013   Procedure: CORONARY ARTERY BYPASS GRAFTING (CABG) times five, using left internal mammary artery, right and left greater saphenous vein;  Surgeon: Dallas KATHEE Jude, MD;  Location: [facility] OR;  Service: Open Heart Surgery;  Laterality: N/A;  LIMA-LAD; SEQ SVG-OM1-OM2-OM3; SVG-RCA    INTRAOPERATIVE TRANSESOPHAGEAL ECHOCARDIOGRAM N/A 08/26/2013   Procedure: INTRAOPERATIVE TRANSESOPHAGEAL ECHOCARDIOGRAM;  Surgeon: Dallas KATHEE Jude, MD;  Location: [facility] OR;  Service: Open Heart Surgery;  Laterality: N/A;    Family History  Problem Relation Age of Onset   Diabetes Mother    Alzheimer's disease Mother    Heart disease Father    Diabetes Father    Heart disease Brother    Colon polyps Neg Hx    Colon cancer Neg Hx    Esophageal cancer Neg Hx    Rectal cancer Neg Hx    Stomach cancer Neg Hx     Social History   Socioeconomic History   Marital status: Divorced    Spouse name: Not on file   Number of children: 1   Years of education: 12   Highest education level: Not on file  Occupational History   Occupation: Retired - Writer  Tobacco Use   Smoking status: Never  Passive exposure: Past   Smokeless tobacco: Never  Vaping Use   Vaping status: Never Used  Substance and Sexual Activity   Alcohol use: No   Drug use: No   Sexual activity: Not on file  Other Topics Concern   Not on file  Social History Narrative   Born in [place] and raised in [place]. Currently resides in an apartment with his sister. Fun: Basketball   Denies religious beliefs effecting healthcare.    Social Drivers of Health   Tobacco Use: Low Risk (01/03/2024)   Patient History    Smoking Tobacco Use: Never    Smokeless Tobacco Use: Never    Passive Exposure: Past  Financial Resource Strain: Low Risk (01/17/2022)   Overall Financial Resource Strain (CARDIA)    Difficulty of Paying Living Expenses: Not hard at all  Food Insecurity:  No Food Insecurity (01/17/2022)   Hunger Vital Sign    Worried About Running Out of Food in the Last Year: Never true    Ran Out of Food in the Last Year: Never true  Transportation Needs: No Transportation Needs (01/17/2022)   PRAPARE - Administrator, Civil Service (Medical): No    Lack of Transportation (Non-Medical): No  Physical Activity: Insufficiently Active (01/17/2022)   Exercise Vital Sign    Days of Exercise per Week: 1 day    Minutes of Exercise per Session: 30 min  Stress: No Stress Concern Present (01/17/2022)   [employer] of Occupational Health - Occupational Stress Questionnaire    Feeling of Stress : Not at all  Social Connections: Moderately Isolated (01/17/2022)   Social Connection and Isolation Panel    Frequency of Communication with Friends and Family: Three times a week    Frequency of Social Gatherings with Friends and Family: Once a week    Attends Religious Services: More than 4 times per year    Active Member of [employer] or Organizations: No    Attends Banker Meetings: Never    Marital Status: Divorced  Catering Manager Violence: Not At Risk (01/17/2022)   Humiliation, Afraid, Rape, and Kick questionnaire    Fear of Current or Ex-Partner: No    Emotionally Abused: No    Physically Abused: No    Sexually Abused: No  Depression (PHQ2-9): Low Risk (01/03/2024)   Depression (PHQ2-9)    PHQ-2 Score: 0  Alcohol Screen: Low Risk (01/17/2022)   Alcohol Screen    Last Alcohol Screening Score (AUDIT): 0  Housing: Low Risk (01/17/2022)   Housing    Last Housing Risk Score: 0  Utilities: Not At Risk (01/17/2022)   AHC Utilities    Threatened with loss of utilities: No  Health Literacy: Not on file    Outpatient Medications Prior to Visit  Medication Sig Dispense Refill   aspirin  81 MG tablet Take 81 mg by mouth daily.     atropine 1 % ophthalmic solution Place 1 drop into the right eye.     cholecalciferol (VITAMIN D3) 25 MCG (1000 UNIT)  tablet Take 1,000 Units by mouth daily.     metoprolol  succinate (TOPROL -XL) 50 MG 24 hr tablet Take 1 tablet (50 mg total) by mouth daily. Take with or immediately following a meal. 90 tablet 0   Multiple Vitamin (MULTIVITAMIN) tablet Take 1 tablet by mouth daily.     Netarsudil -Latanoprost  (ROCKLATAN ) 0.02-0.005 % SOLN Place 1 drop into both eyes at bedtime.     Omega-3 Fatty Acids (FISH OIL) 1000 MG CAPS Take  1 capsule by mouth at bedtime.     RYBELSUS  7 MG TABS TAKE 1 TABLET DAILY 90 tablet 3   SIMBRINZA 1-0.2 % SUSP Place 1 drop into both eyes in the morning and at bedtime.     tamsulosin  (FLOMAX ) 0.4 MG CAPS capsule TAKE 1 CAPSULE(0.4 MG) BY MOUTH DAILY AFTER SUPPER 90 capsule 3   timolol  (TIMOPTIC ) 0.5 % ophthalmic solution Place 1 drop into both eyes every morning.     vitamin B-12 (CYANOCOBALAMIN ) 1000 MCG tablet Take 1,000 mcg by mouth daily.     losartan  (COZAAR ) 25 MG tablet Take 1 tablet (25 mg total) by mouth daily. 90 tablet 0   rosuvastatin  (CRESTOR ) 40 MG tablet Take 1 tablet (40 mg total) by mouth daily. 90 tablet 0   No facility-administered medications prior to visit.    Allergies[1]  ROS    See HPI Objective:    Physical Exam Vitals reviewed.  Constitutional:      General: He is not in acute distress.    Appearance: He is not toxic-appearing.  HENT:     Head: Normocephalic and atraumatic.     Mouth/Throat:     Mouth: Mucous membranes are moist.     Pharynx: Oropharynx is clear.  Eyes:     Extraocular Movements: Extraocular movements intact.     Pupils: Pupils are equal, round, and reactive to light.  Cardiovascular:     Rate and Rhythm: Normal rate and regular rhythm.     Pulses: Normal pulses.     Heart sounds: Normal heart sounds. No murmur heard. Pulmonary:     Effort: Pulmonary effort is normal. No respiratory distress.     Breath sounds: Normal breath sounds. No wheezing.  Musculoskeletal:        General: No swelling.     Cervical back: Neck  supple.  Skin:    General: Skin is warm and dry.  Neurological:     General: No focal deficit present.     Mental Status: He is alert and oriented to person, place, and time.  Psychiatric:        Mood and Affect: Mood normal.        Behavior: Behavior normal.        Thought Content: Thought content normal.        Judgment: Judgment normal.      BP 124/68   Pulse 67   Ht 5' 9 (1.753 m)   Wt 181 lb 3.2 oz (82.2 kg)   SpO2 100%   BMI 26.76 kg/m  Wt Readings from Last 3 Encounters:  01/03/24 181 lb 3.2 oz (82.2 kg)  07/04/23 181 lb (82.1 kg)  04/02/23 183 lb 12.8 oz (83.4 kg)       Assessment & Plan:   Problem List Items Addressed This Visit       Cardiovascular and Mediastinum   Coronary artery disease involving native coronary artery of native heart without angina pectoris   S/p CABG. Asymptomatic.  On ASA and Statin       Relevant Medications   rosuvastatin  (CRESTOR ) 40 MG tablet   losartan  (COZAAR ) 25 MG tablet   Hypertension associated with diabetes (HCC)   Well controlled, no changes to meds. Encouraged heart healthy diet such as the DASH diet and exercise as tolerated.        Relevant Medications   rosuvastatin  (CRESTOR ) 40 MG tablet   losartan  (COZAAR ) 25 MG tablet   Other Relevant Orders   CBC with Differential/Platelet  TSH     Endocrine   Hyperlipidemia associated with type 2 diabetes mellitus (HCC)   Tolerating statin. Encourage heart healthy diet such as MIND or DASH diet, increase exercise, avoid trans fats, simple carbohydrates and processed foods, consider a krill or fish or flaxseed oil cap daily.        Relevant Medications   rosuvastatin  (CRESTOR ) 40 MG tablet   losartan  (COZAAR ) 25 MG tablet   Other Relevant Orders   Lipid panel   Type 2 diabetes mellitus with chronic kidney disease, without long-term current use of insulin  (HCC) - Primary   Managed with Rybelsus  On statin, ARB DM Foot exam updated hgba1c acceptable, minimize  simple carbs. Increase exercise as tolerated. Continue current meds       Relevant Medications   rosuvastatin  (CRESTOR ) 40 MG tablet   losartan  (COZAAR ) 25 MG tablet   Other Relevant Orders   Comprehensive metabolic panel with GFR   HgB J8r     Genitourinary   CKD (chronic kidney disease), stage III [facility])   Following with nephrology, [facility].      Prostate cancer [facility])   Update PSA Refer to Urology      Relevant Orders   PSA   Ambulatory referral to Urology     Other   Anemia   Asymptomatic. Update labs.      B12 deficiency   Supplement and monitor      Relevant Orders   B12   Other Visit Diagnoses       Need for influenza vaccination       Relevant Orders   Flu vaccine HIGH DOSE PF(Fluzone Trivalent) (Completed)     Vitamin D  deficiency       Relevant Orders   Vitamin D  (25 hydroxy)      General Health Maintenance Routine health maintenance discussed. Up to date on immunizations.  Ophthalmology follow-up next month.  Scheduled follow-up in six months for physical exam.    I am having Reyes Meissner maintain his aspirin , multivitamin, Fish Oil, Simbrinza, timolol , Rocklatan , cholecalciferol, cyanocobalamin , atropine, tamsulosin , Rybelsus , metoprolol  succinate, rosuvastatin , and losartan .  Meds ordered this encounter  Medications   rosuvastatin  (CRESTOR ) 40 MG tablet    Sig: Take 1 tablet (40 mg total) by mouth daily.    Dispense:  90 tablet    Refill:  0   losartan  (COZAAR ) 25 MG tablet    Sig: Take 1 tablet (25 mg total) by mouth daily.    Dispense:  90 tablet    Refill:  0      [1]  Allergies Allergen Reactions   Jardiance  [Empagliflozin ] Other (See Comments)    UTI   "

## 2024-01-05 ENCOUNTER — Ambulatory Visit: Payer: Self-pay | Admitting: Student

## 2024-01-05 DIAGNOSIS — I152 Hypertension secondary to endocrine disorders: Secondary | ICD-10-CM

## 2024-01-05 DIAGNOSIS — E7800 Pure hypercholesterolemia, unspecified: Secondary | ICD-10-CM

## 2024-01-05 DIAGNOSIS — E538 Deficiency of other specified B group vitamins: Secondary | ICD-10-CM

## 2024-01-05 MED ORDER — EZETIMIBE 10 MG PO TABS: 10.0000 mg | ORAL_TABLET | Freq: Every day | ORAL | 1 refills | Status: AC

## 2024-01-05 MED ORDER — CYANOCOBALAMIN 500 MCG PO TABS: 500.0000 ug | ORAL_TABLET | Freq: Every day | ORAL | Status: AC

## 2024-01-05 NOTE — Progress Notes (Signed)
 Please contact the patient to review results. LDL is elevated at 171. Please confirm that he is taking Crestor  (rosuvastatin ) 40 mg daily as prescribed. I am adding Zetia  (ezetimibe ) to further help lower his cholesterol. LDL goal is <70.  Please schedule a lab appointment to recheck a lipid panel in 3 months. Advise the patient to follow up with Cardiology. If he is agreeable, please place a Cardiology referral for management of dyslipidemia.

## 2024-02-14 ENCOUNTER — Ambulatory Visit

## 2024-02-14 VITALS — BP 124/68 | Ht 69.0 in | Wt 181.0 lb

## 2024-02-14 DIAGNOSIS — Z0000 Encounter for general adult medical examination without abnormal findings: Secondary | ICD-10-CM | POA: Diagnosis not present

## 2024-02-14 NOTE — Progress Notes (Signed)
 " I connected with  Angel Benson on 02/14/24 by a audio enabled telemedicine application and verified that I am speaking with the correct person using two identifiers.  Patient Location: Home  Provider Location: Home Office  Persons Participating in Visit: Patient.  I discussed the limitations of evaluation and management by telemedicine. The patient expressed understanding and agreed to proceed.  Vital Signs: Because this visit was a virtual/telehealth visit, some criteria may be missing or patient reported. Any vitals not documented were not able to be obtained and vitals that have been documented are patient reported.  Chief Complaint  Patient presents with   Medicare Wellness     Subjective:   Angel Benson is a 74 y.o. male who presents for a Medicare Annual Wellness Visit.  Visit info / Clinical Intake: Medicare Wellness Visit Type:: Subsequent Annual Wellness Visit Persons participating in visit and providing information:: patient Medicare Wellness Visit Mode:: Telephone If telephone:: video declined Since this visit was completed virtually, some vitals may be partially provided or unavailable. Missing vitals are due to the limitations of the virtual format.: Documented vitals are patient reported If Telephone or Video please confirm:: I connected with patient using audio/video enable telemedicine. I verified patient identity with two identifiers, discussed telehealth limitations, and patient agreed to proceed. Patient Location:: home Provider Location:: Home office Interpreter Needed?: No Pre-visit prep was completed: yes AWV questionnaire completed by patient prior to visit?: no Living arrangements:: (!) lives alone Patient's Overall Health Status Rating: very good Typical amount of pain: some Does pain affect daily life?: no Are you currently prescribed opioids?: no  Dietary Habits and Nutritional Risks How many meals a day?: 2 Eats fruit and vegetables daily?:  yes Most meals are obtained by: preparing own meals; having others provide food In the last 2 weeks, have you had any of the following?: none Diabetic:: (!) yes Any non-healing wounds?: no How often do you check your BS?: 2 Would you like to be referred to a Nutritionist or for Diabetic Management? : no  Functional Status Activities of Daily Living (to include ambulation/medication): Independent Ambulation: Independent Medication Administration: Independent Home Management (perform basic housework or laundry): Independent Manage your own finances?: yes Primary transportation is: driving Concerns about vision?: no *vision screening is required for WTM* Concerns about hearing?: no  Fall Screening Falls in the past year?: 0 Number of falls in past year: 0 Was there an injury with Fall?: 0 Fall Risk Category Calculator: 0 Patient Fall Risk Level: Low Fall Risk  Fall Risk Patient at Risk for Falls Due to: No Fall Risks Fall risk Follow up: Falls evaluation completed; Falls prevention discussed  Home and Transportation Safety: All rugs have non-skid backing?: N/A, no rugs All stairs or steps have railings?: N/A, no stairs Grab bars in the bathtub or shower?: yes Have non-skid surface in bathtub or shower?: yes Good home lighting?: yes Regular seat belt use?: yes Hospital stays in the last year:: no  Cognitive Assessment Difficulty concentrating, remembering, or making decisions? : no Will 6CIT or Mini Cog be Completed: yes What year is it?: 0 points What month is it?: 0 points Give patient an address phrase to remember (5 components): apple , table ,penny About what time is it?: 0 points Count backwards from 20 to 1: 0 points Say the months of the year in reverse: 0 points Repeat the address phrase from earlier: 0 points 6 CIT Score: 0 points  Advance Directives (For Healthcare) Does Patient Have a Medical  Advance Directive?: No Would patient like information on creating  a medical advance directive?: No - Patient declined  Reviewed/Updated  Reviewed/Updated: Reviewed All (Medical, Surgical, Family, Medications, Allergies, Care Teams, Patient Goals)    Allergies (verified) Jardiance  [empagliflozin ]   Current Medications (verified) Outpatient Encounter Medications as of 02/14/2024  Medication Sig   aspirin  81 MG tablet Take 81 mg by mouth daily.   atropine 1 % ophthalmic solution Place 1 drop into the right eye.   cholecalciferol (VITAMIN D3) 25 MCG (1000 UNIT) tablet Take 1,000 Units by mouth daily.   cyanocobalamin  (V-R VITAMIN B-12) 500 MCG tablet Take 1 tablet (500 mcg total) by mouth daily.   ezetimibe  (ZETIA ) 10 MG tablet Take 1 tablet (10 mg total) by mouth daily.   losartan  (COZAAR ) 25 MG tablet Take 1 tablet (25 mg total) by mouth daily.   metoprolol  succinate (TOPROL -XL) 50 MG 24 hr tablet Take 1 tablet (50 mg total) by mouth daily. Take with or immediately following a meal.   Multiple Vitamin (MULTIVITAMIN) tablet Take 1 tablet by mouth daily.   Netarsudil -Latanoprost  (ROCKLATAN ) 0.02-0.005 % SOLN Place 1 drop into both eyes at bedtime.   Omega-3 Fatty Acids (FISH OIL) 1000 MG CAPS Take 1 capsule by mouth at bedtime.   rosuvastatin  (CRESTOR ) 40 MG tablet Take 1 tablet (40 mg total) by mouth daily.   RYBELSUS  7 MG TABS TAKE 1 TABLET DAILY   SIMBRINZA 1-0.2 % SUSP Place 1 drop into both eyes in the morning and at bedtime.   tamsulosin  (FLOMAX ) 0.4 MG CAPS capsule TAKE 1 CAPSULE(0.4 MG) BY MOUTH DAILY AFTER SUPPER   timolol  (TIMOPTIC ) 0.5 % ophthalmic solution Place 1 drop into both eyes every morning.   No facility-administered encounter medications on file as of 02/14/2024.    History: Past Medical History:  Diagnosis Date   Anemia    CAD, multiple vessel    s/p CABG   Cataract    bilateral cataract removed   Chicken pox    Diabetes mellitus without complication (HCC)    Type 2   GERD (gastroesophageal reflux disease)    Glaucoma     Hypercholesteremia    Hypertension    Myocardial infarction (HCC) 2015   then CABG   Prostate CA (HCC) 2022   watching it - no sx planned at this time (09/05/2020)   Tuberculosis 1993   received tx post exposure to person with TB   Urinary tract bacterial infections    Vitamin B12 deficiency    Past Surgical History:  Procedure Laterality Date   CARDIAC CATHETERIZATION     CATARACT EXTRACTION, BILATERAL     CORONARY ARTERY BYPASS GRAFT N/A 08/26/2013   Procedure: CORONARY ARTERY BYPASS GRAFTING (CABG) times five, using left internal mammary artery, right and left greater saphenous vein;  Surgeon: Dallas KATHEE Jude, MD;  Location: [facility] OR;  Service: Open Heart Surgery;  Laterality: N/A;  LIMA-LAD; SEQ SVG-OM1-OM2-OM3; SVG-RCA    INTRAOPERATIVE TRANSESOPHAGEAL ECHOCARDIOGRAM N/A 08/26/2013   Procedure: INTRAOPERATIVE TRANSESOPHAGEAL ECHOCARDIOGRAM;  Surgeon: Dallas KATHEE Jude, MD;  Location: [facility] OR;  Service: Open Heart Surgery;  Laterality: N/A;   Family History  Problem Relation Age of Onset   Diabetes Mother    Alzheimer's disease Mother    Heart disease Father    Diabetes Father    Heart disease Brother    Colon polyps Neg Hx    Colon cancer Neg Hx    Esophageal cancer Neg Hx    Rectal cancer Neg Hx  Stomach cancer Neg Hx    Social History   Occupational History   Occupation: Retired - Writer  Tobacco Use   Smoking status: Never    Passive exposure: Past   Smokeless tobacco: Never  Vaping Use   Vaping status: Never Used  Substance and Sexual Activity   Alcohol use: No   Drug use: No   Sexual activity: Not on file   Tobacco Counseling Counseling given: Not Answered  SDOH Screenings   Food Insecurity: No Food Insecurity (02/14/2024)  Housing: Low Risk (02/14/2024)  Transportation Needs: No Transportation Needs (02/14/2024)  Utilities: Not At Risk (02/14/2024)  Alcohol Screen: Low Risk (01/17/2022)  Depression (PHQ2-9): Low Risk (02/14/2024)  Financial  Resource Strain: Low Risk (01/17/2022)  Physical Activity: Patient Declined (02/14/2024)  Social Connections: Moderately Integrated (02/14/2024)  Stress: No Stress Concern Present (02/14/2024)  Tobacco Use: Low Risk (02/14/2024)  Health Literacy: Adequate Health Literacy (02/14/2024)   See flowsheets for full screening details  Depression Screen PHQ 2 & 9 Depression Scale- Over the past 2 weeks, how often have you been bothered by any of the following problems? Little interest or pleasure in doing things: 0 Feeling down, depressed, or hopeless (PHQ Adolescent also includes...irritable): 0 PHQ-2 Total Score: 0 Trouble falling or staying asleep, or sleeping too much: 0 Feeling tired or having little energy: 0 Poor appetite or overeating (PHQ Adolescent also includes...weight loss): 0 Feeling bad about yourself - or that you are a failure or have let yourself or your family down: 0 Trouble concentrating on things, such as reading the newspaper or watching television (PHQ Adolescent also includes...like school work): 0 Moving or speaking so slowly that other people could have noticed. Or the opposite - being so fidgety or restless that you have been moving around a lot more than usual: 0 Thoughts that you would be better off dead, or of hurting yourself in some way: 0 PHQ-9 Total Score: 0 If you checked off any problems, how difficult have these problems made it for you to do your work, take care of things at home, or get along with other people?: Not difficult at all  Depression Treatment Depression Interventions/Treatment : EYV7-0 Score <4 Follow-up Not Indicated     Goals Addressed               This Visit's Progress     keep living (pt-stated)               Objective:    Today's Vitals   02/14/24 1033  BP: 124/68  Weight: 181 lb (82.1 kg)  Height: 5' 9 (1.753 m)   Body mass index is 26.73 kg/m.  Hearing/Vision screen Hearing Screening - Comments:: No  difficulties Vision Screening - Comments:: No difficulties. Dr Octavia for eye DR Immunizations and Health Maintenance Health Maintenance  Topic Date Due   Diabetic kidney evaluation - Urine ACR  Never done   Medicare Annual Wellness (AWV)  01/18/2023   DTaP/Tdap/Td (2 - Td or Tdap) 02/02/2024   OPHTHALMOLOGY EXAM  05/20/2024   HEMOGLOBIN A1C  07/03/2024   Diabetic kidney evaluation - eGFR measurement  01/02/2025   FOOT EXAM  01/02/2025   Pneumococcal Vaccine: 50+ Years  Completed   Influenza Vaccine  Completed   Hepatitis C Screening  Completed   Zoster Vaccines- Shingrix   Completed   Meningococcal B Vaccine  Aged Out   Colonoscopy  Discontinued   COVID-19 Vaccine  Discontinued        Assessment/Plan:  This is a routine wellness examination for Angel Benson.  Patient Care Team: Jason Leita Repine, FNP (Inactive) as PCP - General (Internal Medicine) Jacques Camie Pepper, PA-C as Physician Assistant (Physician Assistant) Wheeler Harlene CROME, NP as Nurse Practitioner (Nurse Practitioner)  I have personally reviewed and noted the following in the patients chart:   Medical and social history Use of alcohol, tobacco or illicit drugs  Current medications and supplements including opioid prescriptions. Functional ability and status Nutritional status Physical activity Advanced directives List of other physicians Hospitalizations, surgeries, and ER visits in previous 12 months Vitals Screenings to include cognitive, depression, and falls Referrals and appointments  No orders of the defined types were placed in this encounter.  In addition, I have reviewed and discussed with patient certain preventive protocols, quality metrics, and best practice recommendations. A written personalized care plan for preventive services as well as general preventive health recommendations were provided to patient.   Angel Benson Right, [place]   02/14/2024   No follow-ups on file.  After Visit  Summary: (Declined) Due to this being a telephonic visit, with patients personalized plan was offered to patient but patient Declined AVS at this time   Nurse Notes: patient voiced no concerns at this time  "

## 2024-02-14 NOTE — Patient Instructions (Signed)
 Angel Benson,  Thank you for taking the time for your Medicare Wellness Visit. I appreciate your continued commitment to your health goals. Please review the care plan we discussed, and feel free to reach out if I can assist you further.  Please note that Annual Wellness Visits do not include a physical exam. Some assessments may be limited, especially if the visit was conducted virtually. If needed, we may recommend an in-person follow-up with your provider.  Ongoing Care Seeing your primary care provider every 3 to 6 months helps us  monitor your health and provide consistent, personalized care.   Referrals If a referral was made during today's visit and you haven't received any updates within two weeks, please contact the referred provider directly to check on the status.  Recommended Screenings:  Health Maintenance  Topic Date Due   Kidney health urinalysis for diabetes  Never done   Medicare Annual Wellness Visit  01/18/2023   DTaP/Tdap/Td vaccine (2 - Td or Tdap) 02/02/2024   Eye exam for diabetics  05/20/2024   Hemoglobin A1C  07/03/2024   Yearly kidney function blood test for diabetes  01/02/2025   Complete foot exam   01/02/2025   Pneumococcal Vaccine for age over 38  Completed   Flu Shot  Completed   Hepatitis C Screening  Completed   Zoster (Shingles) Vaccine  Completed   Meningitis B Vaccine  Aged Out   Colon Cancer Screening  Discontinued   COVID-19 Vaccine  Discontinued       02/14/2024   10:35 AM  Advanced Directives  Does Patient Have a Medical Advance Directive? No  Would patient like information on creating a medical advance directive? No - Patient declined    Vision: Annual vision screenings are recommended for early detection of glaucoma, cataracts, and diabetic retinopathy. These exams can also reveal signs of chronic conditions such as diabetes and high blood pressure.  Dental: Annual dental screenings help detect early signs of oral cancer, gum disease,  and other conditions linked to overall health, including heart disease and diabetes.  Please see the attached documents for additional preventive care recommendations.

## 2024-07-03 ENCOUNTER — Ambulatory Visit: Admitting: Student
